# Patient Record
Sex: Male | Born: 1955 | Race: White | Hispanic: No | Smoking: Never smoker
Health system: Southern US, Community
[De-identification: ages and names within clinical notes are randomized; demographics above are authoritative.]

## PROBLEM LIST (undated history)

## (undated) DIAGNOSIS — Z972 Presence of dental prosthetic device (complete) (partial): Secondary | ICD-10-CM

## (undated) DIAGNOSIS — D1803 Hemangioma of intra-abdominal structures: Secondary | ICD-10-CM

## (undated) DIAGNOSIS — M1990 Unspecified osteoarthritis, unspecified site: Secondary | ICD-10-CM

## (undated) DIAGNOSIS — M5136 Other intervertebral disc degeneration, lumbar region: Secondary | ICD-10-CM

## (undated) DIAGNOSIS — I10 Essential (primary) hypertension: Secondary | ICD-10-CM

## (undated) DIAGNOSIS — M51369 Other intervertebral disc degeneration, lumbar region without mention of lumbar back pain or lower extremity pain: Secondary | ICD-10-CM

## (undated) DIAGNOSIS — T8859XA Other complications of anesthesia, initial encounter: Secondary | ICD-10-CM

## (undated) DIAGNOSIS — F329 Major depressive disorder, single episode, unspecified: Secondary | ICD-10-CM

## (undated) DIAGNOSIS — K635 Polyp of colon: Secondary | ICD-10-CM

## (undated) DIAGNOSIS — G4700 Insomnia, unspecified: Secondary | ICD-10-CM

## (undated) DIAGNOSIS — R55 Syncope and collapse: Secondary | ICD-10-CM

## (undated) DIAGNOSIS — R972 Elevated prostate specific antigen [PSA]: Secondary | ICD-10-CM

## (undated) DIAGNOSIS — N138 Other obstructive and reflux uropathy: Secondary | ICD-10-CM

## (undated) DIAGNOSIS — K5792 Diverticulitis of intestine, part unspecified, without perforation or abscess without bleeding: Secondary | ICD-10-CM

## (undated) DIAGNOSIS — I639 Cerebral infarction, unspecified: Secondary | ICD-10-CM

## (undated) DIAGNOSIS — I350 Nonrheumatic aortic (valve) stenosis: Secondary | ICD-10-CM

## (undated) DIAGNOSIS — R7303 Prediabetes: Secondary | ICD-10-CM

## (undated) DIAGNOSIS — I700 Atherosclerosis of aorta: Secondary | ICD-10-CM

## (undated) DIAGNOSIS — Z8719 Personal history of other diseases of the digestive system: Secondary | ICD-10-CM

## (undated) DIAGNOSIS — C61 Malignant neoplasm of prostate: Principal | ICD-10-CM

## (undated) DIAGNOSIS — I493 Ventricular premature depolarization: Secondary | ICD-10-CM

## (undated) DIAGNOSIS — F419 Anxiety disorder, unspecified: Secondary | ICD-10-CM

## (undated) DIAGNOSIS — G4730 Sleep apnea, unspecified: Secondary | ICD-10-CM

## (undated) DIAGNOSIS — T4145XA Adverse effect of unspecified anesthetic, initial encounter: Secondary | ICD-10-CM

## (undated) DIAGNOSIS — K5790 Diverticulosis of intestine, part unspecified, without perforation or abscess without bleeding: Secondary | ICD-10-CM

## (undated) DIAGNOSIS — E7800 Pure hypercholesterolemia, unspecified: Secondary | ICD-10-CM

## (undated) DIAGNOSIS — N401 Enlarged prostate with lower urinary tract symptoms: Secondary | ICD-10-CM

## (undated) DIAGNOSIS — R011 Cardiac murmur, unspecified: Secondary | ICD-10-CM

## (undated) DIAGNOSIS — E039 Hypothyroidism, unspecified: Secondary | ICD-10-CM

## (undated) DIAGNOSIS — Z87442 Personal history of urinary calculi: Secondary | ICD-10-CM

## (undated) DIAGNOSIS — K7689 Other specified diseases of liver: Secondary | ICD-10-CM

## (undated) DIAGNOSIS — K760 Fatty (change of) liver, not elsewhere classified: Secondary | ICD-10-CM

## (undated) DIAGNOSIS — F1291 Cannabis use, unspecified, in remission: Secondary | ICD-10-CM

## (undated) HISTORY — PX: BACK SURGERY: SHX140

## (undated) HISTORY — DX: Diverticulitis of intestine, part unspecified, without perforation or abscess without bleeding: K57.92

## (undated) HISTORY — DX: Polyp of colon: K63.5

## (undated) HISTORY — PX: COLONOSCOPY: SHX174

---

## 2012-07-08 ENCOUNTER — Emergency Department: Payer: Self-pay | Admitting: Internal Medicine

## 2013-01-25 DIAGNOSIS — G459 Transient cerebral ischemic attack, unspecified: Secondary | ICD-10-CM

## 2013-01-25 DIAGNOSIS — I639 Cerebral infarction, unspecified: Secondary | ICD-10-CM

## 2013-01-25 HISTORY — DX: Cerebral infarction, unspecified: I63.9

## 2013-01-25 HISTORY — DX: Transient cerebral ischemic attack, unspecified: G45.9

## 2013-06-27 ENCOUNTER — Inpatient Hospital Stay: Payer: Self-pay | Admitting: Internal Medicine

## 2013-06-27 ENCOUNTER — Ambulatory Visit: Payer: Self-pay | Admitting: Neurology

## 2013-06-27 LAB — COMPREHENSIVE METABOLIC PANEL WITH GFR
Albumin: 3.8 g/dL
Alkaline Phosphatase: 75 U/L
Anion Gap: 4 — ABNORMAL LOW
BUN: 15 mg/dL
Bilirubin,Total: 0.5 mg/dL
Calcium, Total: 8.7 mg/dL
Chloride: 105 mmol/L
Co2: 27 mmol/L
Creatinine: 1.05 mg/dL
EGFR (African American): >60
EGFR (Non-African Amer.): >60
Glucose: 129 mg/dL — ABNORMAL HIGH
Osmolality: 274
Potassium: 4.3 mmol/L
SGOT(AST): 43 U/L — ABNORMAL HIGH
SGPT (ALT): 33 U/L
Sodium: 136 mmol/L
Total Protein: 7.1 g/dL

## 2013-06-27 LAB — URINALYSIS, COMPLETE
BACTERIA: NONE SEEN
BLOOD: NEGATIVE
Bilirubin,UR: NEGATIVE
Glucose,UR: NEGATIVE mg/dL (ref 0–75)
KETONE: NEGATIVE
LEUKOCYTE ESTERASE: NEGATIVE
NITRITE: NEGATIVE
Ph: 6 (ref 4.5–8.0)
Protein: NEGATIVE
RBC,UR: 1 /HPF (ref 0–5)
SPECIFIC GRAVITY: 1.017 (ref 1.003–1.030)
Squamous Epithelial: NONE SEEN

## 2013-06-27 LAB — CBC
HCT: 41.6 %
HGB: 14.3 g/dL
MCH: 30 pg
MCHC: 34.2 g/dL
MCV: 88 fL
Platelet: 268 x10 3/mm 3
RBC: 4.75 x10 6/mm 3
RDW: 12.9 %
WBC: 6.7 x10 3/mm 3

## 2013-06-27 LAB — PLATELET FUNCTION ASSAY: COL/EPI PLT FXN SCRN: 61 s

## 2013-06-27 LAB — TROPONIN I: Troponin-I: <0.02 ng/mL

## 2013-06-28 DIAGNOSIS — I517 Cardiomegaly: Secondary | ICD-10-CM

## 2013-06-28 LAB — LIPID PANEL
CHOLESTEROL: 175 mg/dL (ref 0–200)
HDL: 27 mg/dL — AB (ref 40–60)
Ldl Cholesterol, Calc: 105 mg/dL — ABNORMAL HIGH (ref 0–100)
TRIGLYCERIDES: 214 mg/dL — AB (ref 0–200)
VLDL CHOLESTEROL, CALC: 43 mg/dL — AB (ref 5–40)

## 2014-04-24 DIAGNOSIS — E039 Hypothyroidism, unspecified: Secondary | ICD-10-CM | POA: Insufficient documentation

## 2014-05-18 NOTE — Consult Note (Signed)
PATIENT NAME:  Randall Flores, Randall Flores MR#:  [id number] DATE OF BIRTH:  [birth year]  DATE OF CONSULTATION:  06/27/2013  CONSULTING PHYSICIAN:  Leotis Pain, MD  REASON FOR CONSULTATION: Rule out stroke.   HISTORY OF PRESENT ILLNESS: This is a 59 year old gentleman presenting with headache and right-sided weakness. The patient has a past medical history of hypothyroidism, depression,  hyperlipidemia. The patient states he woke up with diffuse pressure-like headache in the occipital regions bilaterally of his head, went back to sleep. When he woke up early in the morning, he noticed that he was weaker in the right upper and right lower extremities. He decided to drive himself to work. When he came to work, he felt that he was still weak and decided to come to the Emergency Department because of the right-sided weakness. For that reason, he was admitted with concern of a stroke. Work-up included a CAT scan of the head that did not show any acute intracranial abnormality. Ultrasound: No hemodynamic stenosis. MRI of the brain: No acute intracranial abnormality. The patient's exam has significantly improved and he is close to baseline. He is on aspirin 81 mg daily.   REVIEW OF SYSTEMS:  CONSTITUTIONAL: Generalized fatigue. No visual changes. No postnasal drip.  RESPIRATORY: No cough. No wheezing.  CARDIOVASCULAR: No chest pain. No orthopnea. GASTROINTESTINAL: No nausea. No vomiting.  GENITOURINARY: No dysuria or hematuria.  ENDOCRINE: No polyuria. No nocturia. INTEGUMENTARY: No rash. No lesions.  MUSCULOSKELETAL: No arthritis. No swelling. No gout. NEUROLOGICAL: Positive weakness, right side, that has improved.  PSYCHIATRIC: No anxiety. Positive for depression.   PAST MEDICAL HISTORY: Hypothyroidism, depression, history of hyperlipidemia.   SOCIAL HISTORY: No smoking. Drinks about 4 or 5 shots of whiskey daily. No illicit drug use. Lives at home with his wife.   FAMILY HISTORY: Mother is alive, history  of Alzheimer's disease. Strong family history of stroke and heart disease.   HOME MEDICATIONS: Include Synthroid, Celexa, aspirin 81 mg, simvastatin.   LABORATORY WORK-UP: Has been reviewed.   IMAGING: No acute intracranial abnormalities on both MRI and CAT scan of the head. No significant hemodynamic stenosis on the ultrasound of his carotids.   PHYSICAL EXAMINATION: VITAL SIGNS: Include a temperature of 97.9, pulse 66, respirations 18, blood pressure 163/92. NEUROLOGIC: The patient is alert, awake, oriented to time, place, location, and the reason why he is in the hospital. Speech appears to be fluent. No signs of dysarthria or aphasia. Repetition intact. Attention intact. Cranial nerve examination: Extraocular movements are intact. Pupils 4 to 2, reactive bilaterally. Facial sensation intact. Facial motor is intact. Tongue is in midline. Uvula elevates symmetrically. Shoulder shrug is intact. Motor strength appears to be 5 out of 5 bilateral upper and lower extremities. No drift noted noticed. Coordination: Finger-to-nose intact. Reflexes symmetrical. Sensation intact to light touch and temperature. Gait not assessed.   IMPRESSION: A 59 year old gentleman with acute onset of a headache followed by right-sided weakness, status post imaging with no acute intracranial abnormalities. The patient's symptoms have improved, but still complaining of dull pressure-like headache in the back of his head. I believe symptoms could be possibility of transient ischemic attack. At the same time, the headache is worrisome, as the patient does not have history of headaches. Last time the patient had a headache, or a migraine as she describes it, was a long time ago when he was diagnosed with hypothyroidism. At the same time, current symptoms could be a complication of the current headache with unilateral weakness, as since his  symptoms are improving.   PLAN: I would like to obtain a CTA of the head just because of  the headache. I want to make sure there are no small aneurysms that could have leaked, which would be better seen on the CTA. Will start the patient on thiamine and folate because he drinks 4 to 5 drinks a day. Pain control for the headache. Agree with continuation of Plavix 75 daily. Physical therapy and occupational therapy. If the CTA is negative, suspect the patient can be discharged tomorrow early in the morning.   Thank you, it was a pleasure seeing this patient. Please call with any questions.   ____________________________ Leotis Pain, MD yz:jcm D: 06/27/2013 19:37:40 ET T: 06/27/2013 20:02:59 ET JOB#: [id number]  cc: Leotis Pain, MD, <Dictator> Leotis Pain MD ELECTRONICALLY SIGNED 07/12/2013 17:20

## 2014-05-18 NOTE — H&P (Signed)
PATIENT NAME:  Randall Flores, Randall Flores MR#:  [id number] DATE OF BIRTH:  12/04/1955  DATE OF ADMISSION:  06/27/2013  PRIMARY CARE PHYSICIAN: Percell Miller L. Luan Pulling, MD   CHIEF COMPLAINT: Headache and right-sided weakness.   HISTORY OF PRESENT ILLNESS: This is a 58 year old male who presents to the Emergency Room due to headache and worsening right-sided weakness. The patient says that he developed a headache late yesterday evening, went to bed with the headache. He woke up this morning, did feel right, where his right side of his body felt kind of heavy. The patient actually drove to work, and at work, he was noticed to have difficulty using his right side of the body. His boss then told him to come to the ER for further evaluation. In the Emergency Room, the patient has significant right-sided weakness. A CT scan of the head is negative, but the patient likely has symptoms of an acute CVA, and therefore hospitalist services were contacted for further treatment and evaluation.   REVIEW OF SYSTEMS:  CONSTITUTIONAL: No documented fever. No weight gain, no weight loss.  EYES: No blurry or double vision.  ENT: No tinnitus. No postnasal drip. No redness of the oropharynx.  RESPIRATORY: No cough. No wheeze. No hemoptysis. No dyspnea.  CARDIOVASCULAR: No chest pain. No orthopnea. No palpitations. No syncope.  GASTROINTESTINAL: No nausea. No vomiting. No diarrhea. No abdominal pain. No melena or hematochezia.  GENITOURINARY: No dysuria, no hematuria.  ENDOCRINE: No polyuria, nocturia, heat or cold intolerance.  HEMATOLOGIC: No anemia, no bruising, no bleeding.  INTEGUMENTARY: No rashes. No lesions.  MUSCULOSKELETAL: No arthritis, no swelling, no gout.  NEUROLOGIC: Positive numbness and weakness on the right side. No ataxia. No seizure-type activity. Positive headache.  PSYCHIATRIC: No anxiety. No insomnia. No ADD. Positive depression.   PAST MEDICAL HISTORY: Consistent with hypothyroidism, depression, history of  hyperlipidemia.   ALLERGIES: No known drug allergies.   SOCIAL HISTORY: No smoking. Does drink about 4 to 5 shots of whiskey daily. No illicit drug abuse. Lives at home with his wife.   FAMILY HISTORY: Mother is alive, has history of Alzheimer's. Father died from an acute MI. He also has a sister who has had a stroke.   CURRENT MEDICATIONS: As follows:  1. Synthroid 175 mcg daily.  2. Celexa 10 mg daily.  3. Aspirin 81 mg daily. 4. Simvastatin daily, although he just takes it occasionally.   PHYSICAL EXAMINATION: Presently is as follows:  VITAL SIGNS: Noted to be: Temperature is 98.2, pulse 69, respirations 18, blood pressure 144/67, saturation is 99% on room air.  GENERAL: He is a pleasant-appearing male in no apparent distress.  HEAD, EYES, EARS, NOSE AND THROAT: Atraumatic, normocephalic. Extraocular muscles are intact. Pupils are equally reactive to light. Sclerae are anicteric. No conjunctival injection. No pharyngeal erythema.  NECK: Supple. There is no jugular venous distention, no bruits, no lymphadenopathy, no thyromegaly.  HEART: Regular rate and rhythm. No murmurs, no rubs, no clicks.  LUNGS: Clear to auscultation bilaterally. No rales or rhonchi. No wheezes.  ABDOMEN: Soft, flat, nontender, nondistended. Has good bowel sounds. No hepatosplenomegaly appreciated.  EXTREMITIES: No evidence of any cyanosis, clubbing or peripheral edema. Has +2 pedal and radial pulses bilaterally.  NEUROLOGICAL: The patient is alert, awake and oriented x3. He does have a 2 out of 5 strength on his right upper and right lower extremity as compared to the left. No facial droop. Babinski's are downgoing bilaterally. Reflexes are +2 bilaterally.  SKIN: Moist and warm with no  rashes.  LYMPHATIC: There is no cervical or axillary lymphadenopathy.   LABORATORY DATA: Showed a serum glucose of 129, BUN 15, creatinine 1.05, sodium 136, potassium 4.3, chloride 105, bicarbonate 27. LFTs are within normal limits.  Troponin less than 0.02. White cell count 6.7, hemoglobin 14.3, hematocrit 41.6, platelet count 268.   IMAGING: The patient did have a CT scan of the head done without contrast which showed minimal white matter microvascular disease, but no acute findings. The patient also had a chest x-ray done which showed no acute cardiopulmonary disease.   ASSESSMENT AND PLAN: This is a 59 year old male with a history of hypothyroidism, depression, history of hyperlipidemia, who presents to the hospital due to headache and right-sided weakness.   1. Acute cerebrovascular accident. This is likely the cause of the patient's headache and right-sided weakness. The patient is out of the thrombolytic window as his symptoms began late yesterday evening. His initial CT head is negative. I will get an MRI of his brain, get a carotid duplex and a 2-dimensional echocardiogram. The patient apparently is on aspirin at home, and therefore I will switch him to Plavix. I will get a platelet function assay as requested by neurology. Will follow q.4 hour neuro checks. Get a neurology consult. I discussed the case with Dr. Irish Elders, who will see the patient. I will get a physical therapy and occupational therapy consult.  2. Hypothyroidism. Will continue his Synthroid.  3. Hyperlipidemia. The patient intermittently takes simvastatin as he gets myalgias with it. I will place him on low-dose Pravachol for now. Will check a lipid profile.  4. Depression. Continue Celexa.  5. Hypertension. The patient has no history of hypertension. Therefore, this is probably Cushing's reflex from his acute cerebrovascular accident. I will tolerate some element of hypertension, place him on some p.r.n. IV hydralazine for now.   CODE STATUS: The patient is a full code.   TIME SPENT ON ADMISSION: 50 minutes.   ____________________________ Belia Heman. Verdell Carmine, MD vjs:lb D: 06/27/2013 11:23:35 ET T: 06/27/2013 11:58:24 ET JOB#: [id number]  cc: Belia Heman.  Verdell Carmine, MD, <Dictator> Henreitta Leber MD ELECTRONICALLY SIGNED 06/30/2013 21:55

## 2014-05-18 NOTE — Discharge Summary (Signed)
PATIENT NAME:  Randall Flores, Randall Flores MR#:  [id number] DATE OF BIRTH:  [birth year]  DATE OF ADMISSION:  06/27/2013 DATE OF DISCHARGE:  06/28/2013   ADMISSION DIAGNOSIS: Cerebrovascular accident.  DISCHARGE DIAGNOSIS: Transient ischemic attack.   CONSULTATIONS: Neurology.   IMAGING:  1. The patient had a CTA of the head which showed no evidence of any aneurysm.  2. MRI of the brain showed no evidence of intracranial hemorrhage or CVA.  3. Carotid ultrasound showed no evidence of significant hemodynamic stenosis.   LABORATORY DATA: Triglycerides 214, cholesterol 175, HDL 27, LDL is 105.   HOSPITAL COURSE: This is a 59 year old male who presented with right-sided weakness. For further details, please refer to the H and P.   1. Transient ischemic attack. The patient's symptoms were consistent with transient ischemic attack. His MRI was negative. He endorses a lot of stress, which probably contributed to his symptoms. The patient was given transient ischemic attack information. Switched from aspirin to Plavix, started on a statin and will need close followup as an outpatient.  2. Elevated blood pressure. The patient has no diagnosis of hypertension. At this time, we did not start any medications, but I did tell him that he needs to follow up closely for his blood pressure as an outpatient and may need to be started if dietary changes and lifestyle changes do not help. He is trying to lose weight, and he is under a lot of stress, which contributes to his elevated blood pressure.  3. Hyperlipidemia. The patient is now started on a statin medication. Side effects, alternatives, benefits and risks were discussed in great detail with him, including liver dysfunction and abdominal pain.  4. Alcohol abuse. The patient does drink alcohol. He is on folic acid as per neurology recommendations.  5. Hypothyroidism. The patient will continue his Synthroid. 6. Depression. The patient was continued on his Celexa.    DISCHARGE MEDICATIONS:  1. Levothyroxine 175 mcg daily.  2. Multivitamin 1 tablet daily.  3. Vitamin C 500 mg daily.  4. Bee pollen daily.  5. Citalopram 10 mg daily. 6. Pravastatin 20 mg at bedtime.  7. Plavix 75 mg daily.  8. Folic acid 1 tablet daily.   DISCHARGE DIET: Low-sodium diet.   DISCHARGE ACTIVITY: As tolerated.   DISCHARGE FOLLOWUP: The patient was referred to [facility].   CONDITION: The patient is medically stable for discharge.   TIME SPENT: 35 minutes.   ____________________________ Donell Beers. Benjie Karvonen, MD spm:lb D: 06/28/2013 12:40:10 ET T: 06/28/2013 12:54:49 ET JOB#: [id number]  cc: Lurleen Soltero P. Benjie Karvonen, MD, <Dictator> Donell Beers Ellen Goris MD ELECTRONICALLY SIGNED 06/28/2013 13:40

## 2014-07-09 ENCOUNTER — Telehealth: Payer: Self-pay | Admitting: Family Medicine

## 2014-07-09 NOTE — Telephone Encounter (Signed)
Pt was last seen in January and was supposed to return for f/u 12 weeks later (in April) [place] site shows that he is receiving prescriptions from a Dr. Ouida Sills at [facility] Please verify if he has transferred care and send letter if appropriate If he has not transferred care to another primary provider, he needs an appt

## 2014-07-09 NOTE — Telephone Encounter (Signed)
E-Fax refill for patient medication: Rx: Citalopram 20 mg tab Qty: 30 Instructions: take one tab by mouth once daily Pharmacy: [address]

## 2014-09-09 ENCOUNTER — Emergency Department: Payer: No Typology Code available for payment source

## 2014-09-09 ENCOUNTER — Emergency Department
Admission: EM | Admit: 2014-09-09 | Discharge: 2014-09-09 | Disposition: A | Discharge status: Home / Self Care | Payer: No Typology Code available for payment source | Attending: Emergency Medicine | Admitting: Emergency Medicine

## 2014-09-09 ENCOUNTER — Encounter: Payer: Self-pay | Admitting: *Deleted

## 2014-09-09 DIAGNOSIS — Y9389 Activity, other specified: Secondary | ICD-10-CM | POA: Insufficient documentation

## 2014-09-09 DIAGNOSIS — S60222A Contusion of left hand, initial encounter: Secondary | ICD-10-CM | POA: Insufficient documentation

## 2014-09-09 DIAGNOSIS — Y9289 Other specified places as the place of occurrence of the external cause: Secondary | ICD-10-CM | POA: Diagnosis not present

## 2014-09-09 DIAGNOSIS — Y998 Other external cause status: Secondary | ICD-10-CM | POA: Insufficient documentation

## 2014-09-09 DIAGNOSIS — I10 Essential (primary) hypertension: Secondary | ICD-10-CM | POA: Insufficient documentation

## 2014-09-09 DIAGNOSIS — W228XXA Striking against or struck by other objects, initial encounter: Secondary | ICD-10-CM | POA: Insufficient documentation

## 2014-09-09 DIAGNOSIS — S6992XA Unspecified injury of left wrist, hand and finger(s), initial encounter: Secondary | ICD-10-CM | POA: Diagnosis present

## 2014-09-09 HISTORY — DX: Essential (primary) hypertension: I10

## 2014-09-09 HISTORY — DX: Anxiety disorder, unspecified: F41.9

## 2014-09-09 HISTORY — DX: Cerebral infarction, unspecified: I63.9

## 2014-09-09 MED ORDER — KETOROLAC TROMETHAMINE 30 MG/ML IJ SOLN
INTRAMUSCULAR | Status: AC
Start: 2014-09-09 — End: 2014-09-10
  Filled 2014-09-09: qty 1

## 2014-09-09 MED ORDER — ONDANSETRON HCL 4 MG/2ML IJ SOLN
INTRAMUSCULAR | Status: AC
Start: 2014-09-09 — End: 2014-09-10
  Filled 2014-09-09: qty 2

## 2014-09-09 NOTE — ED Notes (Signed)
Pt says he hit wall on Saturday night and now hand is swollen.

## 2014-09-09 NOTE — ED Provider Notes (Signed)
[facility] Emergency Department Provider Note ____________________________________________  Time seen: Approximately 9:39 AM  I have reviewed the triage vital signs and the nursing notes.   HISTORY  Chief Complaint Hand Injury   HPI Randall Flores is a 59 y.o. male who presents to the emergency department for left hand pain. He states that he hit the night table in his sleep Saturday night. He now has pain and swelling in the dorsal aspect of the left hand. Patient is right-handed.   Past Medical History  Diagnosis Date  . Stroke   . Anxiety   . Hypertension   . Thyroid disease   . Depression     There are no active problems to display for this patient.   Past Surgical History  Procedure Laterality Date  . Back surgery      No current outpatient prescriptions on file.  Allergies Cod and Codeine  No family history on file.  Social History Social History  Substance Use Topics  . Smoking status: Never Smoker   . Smokeless tobacco: None  . Alcohol Use: Yes    Review of Systems Constitutional: No recent illness. Eyes: No visual changes. ENT: No sore throat. Cardiovascular: Denies chest pain or palpitations. Respiratory: Denies shortness of breath. Gastrointestinal: No abdominal pain.  Genitourinary: Negative for dysuria. Musculoskeletal: Pain in left hand. Skin: Negative for rash. Neurological: Negative for headaches, focal weakness or numbness. 10-point ROS otherwise negative.  ____________________________________________   PHYSICAL EXAM:  VITAL SIGNS: ED Triage Vitals  Enc Vitals Group     BP 09/09/14 0902 144/88 mmHg     Pulse Rate 09/09/14 0902 69     Resp 09/09/14 0902 20     Temp 09/09/14 0902 98 F (36.7 C)     Temp Source 09/09/14 0902 Oral     SpO2 09/09/14 0902 96 %     Weight 09/09/14 0902 232 lb (105.235 kg)     Height 09/09/14 0902 5\' 10"  (1.778 m)     Head Cir --      Peak Flow --      Pain Score  09/09/14 0906 7     Pain Loc --      Pain Edu? --      Excl. in [place]? --     Constitutional: Alert and oriented. Well appearing and in no acute distress. Eyes: Conjunctivae are normal. EOMI. Head: Atraumatic. Nose: No congestion/rhinnorhea. Neck: No stridor.  Respiratory: Normal respiratory effort.   Musculoskeletal: Nontender range of motion of fingers of left hand do to pain. No obvious deformity. Neurologic:  Normal speech and language. No gross focal neurologic deficits are appreciated. Speech is normal. No gait instability. Skin:  Hematoma over the second third and fourth metacarpals Psychiatric: Mood and affect are normal. Speech and behavior are normal.  ____________________________________________   LABS (all labs ordered are listed, but only abnormal results are displayed)  Labs Reviewed - No data to display ____________________________________________  RADIOLOGY  No acute bony abnormality.  I, Sherrie George, personally viewed and evaluated these images (plain radiographs) as part of my medical decision making.  ____________________________________________   PROCEDURES  Procedure(s) performed: Ace bandage applied by ER tech. Neurovascularly intact post application.   ____________________________________________   INITIAL IMPRESSION / ASSESSMENT AND PLAN / ED COURSE  Pertinent labs & imaging results that were available during my care of the patient were reviewed by me and considered in my medical decision making (see chart for details).  Patient was advised to  wear the Ace bandage for comfort. He was advised to follow-up with orthopedics for symptoms that are not improving over the next week .__________________________________________   FINAL CLINICAL IMPRESSION(S) / ED DIAGNOSES  Final diagnoses:  Traumatic hematoma of hand, left, initial encounter      Victorino Dike, FNP 09/09/14 [place], MD 09/09/14 1544

## 2014-10-13 ENCOUNTER — Other Ambulatory Visit: Payer: Self-pay | Admitting: Family Medicine

## 2014-10-13 NOTE — Telephone Encounter (Signed)
Denied Patient is seeing Dr. Ouida Sills now per Epic notes

## 2015-01-21 ENCOUNTER — Other Ambulatory Visit: Payer: Self-pay | Admitting: Internal Medicine

## 2015-01-21 DIAGNOSIS — R1013 Epigastric pain: Secondary | ICD-10-CM

## 2015-01-22 ENCOUNTER — Ambulatory Visit
Admission: RE | Admit: 2015-01-22 | Discharge: 2015-01-22 | Disposition: A | Discharge status: Home / Self Care | Payer: No Typology Code available for payment source | Source: Ambulatory Visit | Attending: Internal Medicine | Admitting: Internal Medicine

## 2015-01-22 DIAGNOSIS — K760 Fatty (change of) liver, not elsewhere classified: Secondary | ICD-10-CM | POA: Insufficient documentation

## 2015-01-22 DIAGNOSIS — K7689 Other specified diseases of liver: Secondary | ICD-10-CM | POA: Diagnosis not present

## 2015-01-22 DIAGNOSIS — R1013 Epigastric pain: Secondary | ICD-10-CM | POA: Insufficient documentation

## 2015-01-22 HISTORY — DX: Fatty (change of) liver, not elsewhere classified: K76.0

## 2015-01-23 ENCOUNTER — Encounter: Payer: Self-pay | Admitting: *Deleted

## 2015-01-23 ENCOUNTER — Other Ambulatory Visit: Payer: Self-pay | Admitting: Internal Medicine

## 2015-01-23 DIAGNOSIS — K7689 Other specified diseases of liver: Secondary | ICD-10-CM

## 2015-02-03 ENCOUNTER — Ambulatory Visit
Admission: RE | Admit: 2015-02-03 | Discharge: 2015-02-03 | Disposition: A | Discharge status: Home / Self Care | Payer: BLUE CROSS/BLUE SHIELD | Source: Ambulatory Visit | Attending: Internal Medicine | Admitting: Internal Medicine

## 2015-02-03 DIAGNOSIS — D1803 Hemangioma of intra-abdominal structures: Secondary | ICD-10-CM | POA: Insufficient documentation

## 2015-02-03 DIAGNOSIS — K7689 Other specified diseases of liver: Secondary | ICD-10-CM | POA: Diagnosis present

## 2015-02-03 DIAGNOSIS — K769 Liver disease, unspecified: Secondary | ICD-10-CM | POA: Insufficient documentation

## 2015-02-03 HISTORY — DX: Hemangioma of intra-abdominal structures: D18.03

## 2015-02-03 MED ORDER — GADOBENATE DIMEGLUMINE 529 MG/ML IV SOLN
20.0000 mL | Freq: Once | INTRAVENOUS | Status: AC | PRN
Start: 2015-02-03 — End: 2015-02-03
  Administered 2015-02-03: 20 mL via INTRAVENOUS

## 2015-02-05 ENCOUNTER — Encounter: Payer: Self-pay | Admitting: General Surgery

## 2015-02-05 ENCOUNTER — Ambulatory Visit (INDEPENDENT_AMBULATORY_CARE_PROVIDER_SITE_OTHER): Payer: BLUE CROSS/BLUE SHIELD | Admitting: General Surgery

## 2015-02-05 VITALS — BP 138/80 | HR 72 | Resp 14 | Ht 70.0 in | Wt 239.0 lb

## 2015-02-05 DIAGNOSIS — R1031 Right lower quadrant pain: Secondary | ICD-10-CM | POA: Diagnosis not present

## 2015-02-05 DIAGNOSIS — R1011 Right upper quadrant pain: Secondary | ICD-10-CM

## 2015-02-05 MED ORDER — POLYETHYLENE GLYCOL 3350 17 GM/SCOOP PO POWD
ORAL | Status: DC
Start: 2015-02-05 — End: 2017-02-02

## 2015-02-05 NOTE — Patient Instructions (Addendum)
The patient is aware to call back for any questions or concerns.  Schedule HIDA scan.  Patient has been scheduled for a HIDA scan at [facility] for 02-13-15 at [facility] Prep: NPO 6 hours prior and no stomach medications.  This patient has also been scheduled for an upper and lower endoscopy on 03-18-15 at [facility]. Patient has been asked to stop Plavix 7 days prior to procedure.

## 2015-02-05 NOTE — Progress Notes (Signed)
Patient ID: Randall Flores, male   DOB: 1955/03/30, 60 y.o.   MRN: [id number]  Chief Complaint  Patient presents with  . Other    gall bladder    HPI Randall Flores is a 60 y.o. male.  Here for evaluation of his gall bladder. He has been having right sided abdominal pain that comes and goes for about a year. The pain last about 45 minutes, and can occur in the early morning hours.  The patient points to the right anterior axillary line midway between the costal margin in the anterior superior iliac spine while describing the location of his pain. He then sleeps his hand inferiorly over the pubic area to the left lower quadrant. Over the holidays he had pain for 6 days in a row. He did have some vomiting as well during the 6 day episode. He states that if he eats something greasy he has abdominal cramps about 30 minutes later. He has not experienced any loose stools. He does notice the pain at night as well, states it feels like his bowels are going to move but don't.  Constipation has been an issues for about 5 years, no bleeding. He had an ultrasound on 01-22-15 and MRI on 02-03-15.  The patient reports he had a colonoscopy 8-10 years ago, age based, and was told that he had polyps. No follow-up was completed. 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  He is here today with his wife of 20 years, Randall Flores.  I personally reviewed the patient's history.  HPI  Past Medical History  Diagnosis Date  . Anxiety   . Hypertension   . Thyroid disease   . Depression   . 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 Colon polyp   . Diverticulitis     Past Surgical History  Procedure Laterality Date  . Back surgery  1998, 2001    [place]  . Colonoscopy      [place]    Family History  Problem Relation  Age of Onset  . Alzheimer's disease Mother     Social History Social History  Substance Use Topics  . Smoking status: Never Smoker   . Smokeless tobacco: Never Used  . Alcohol Use: 0.0 oz/week    0 Standard drinks or equivalent per week     Comment: occasionally    Allergies  Allergen Reactions  . Cod [facility] Allergy]   . Codeine Nausea Only    Current Outpatient Prescriptions  Medication Sig Dispense Refill  . BEE POLLEN PO Take by mouth.    . clopidogrel (PLAVIX) 75 MG tablet Take by mouth.    . docusate sodium (COLACE) 100 MG capsule Take 100 mg by mouth 2 (two) times daily.    Marland Kitchen levothyroxine (SYNTHROID, LEVOTHROID) 137 MCG tablet Take 137 mcg by mouth daily before breakfast.    . lisinopril (PRINIVIL,ZESTRIL) 2.5 MG tablet Take by mouth.    . Multiple Vitamin (MULTIVITAMIN) capsule Take 1 capsule by mouth daily.    . polyethylene glycol powder (GLYCOLAX/MIRALAX) powder 255 grams one bottle for colonoscopy prep 255 g 0   No current facility-administered medications for this visit.    Review of Systems Review of Systems  Constitutional: Negative.   Respiratory: Negative.   Cardiovascular: Negative.   Gastrointestinal: Positive  for nausea, abdominal pain and constipation. Negative for vomiting and diarrhea.  All other systems reviewed and are negative.   Blood pressure 138/80, pulse 72, resp. rate 14, height 5\' 10"  (1.778 m), weight 239 lb (108.41 kg).  Physical Exam Physical Exam  Constitutional: He is oriented to person, place, and time. He appears well-developed and well-nourished.  HENT:  Mouth/Throat: Oropharynx is clear and moist.  Eyes: Conjunctivae are normal. No scleral icterus.  Neck: Neck supple.  Cardiovascular: Normal rate, regular rhythm and normal heart sounds.   No lower leg edema  Pulmonary/Chest: Effort normal and breath sounds normal.  Abdominal: Soft. Normal appearance and bowel sounds are normal. He exhibits no distension. There is no  hepatosplenomegaly. There is tenderness in the right upper quadrant and right lower quadrant. There is no rebound. No hernia.    Genitourinary: Testes normal.  Lymphadenopathy:    He has no cervical adenopathy.       Right: No inguinal adenopathy present.       Left: No inguinal adenopathy present.  Neurological: He is alert and oriented to person, place, and time.  Skin: Skin is warm.  Psychiatric: His behavior is normal.    Data Reviewed  them all ultrasound dated 01/22/2015 showed a small sludge within the gallbladder. No wall thickening. Negative sonographic Murphy sign. Common bile duct 4 mm. Liver showed a multiseptated cyst on the left measuring up to 45 mm. I'll hepatic steatosis noted. MRI suggested for further evaluation of the hepatic cyst.  MRI of the liver dated 02/03/2015 confirmed a complex cyst involving the left lobe as well as an associated small hemangioma. Gallbladder was reported as normal.  Laboratory studies from 10/22/2014 showed a normal basic metabolic panel. Estimated GFR 69. Her function studies were normal. Total bilirubin 0.4, direct bilirubin 0.1. Alkaline phosphatase 56. Normal transaminases.  CBC incomprehensive metabolic panel dated 123456 showed a normal CBC, white blood cell count, minimally depressed hemoglobin at 13.8 (lower limit of normal 14.1) with an MCV of 86.  May 2015 laboratory studies showed a hemoglobin 14.3 with an MCV of 88.  Assessment    dominant pain, not clearly related to the biliary tract. Inconclusive imaging. 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    Plan    In light of the patient's abdominal pain primarily in the right side of the colon more so than the right upper quadrant, we'll obtain a HIDA scan with CCK to determine if the air is truly a biliary source. If this is negative upper and lower endoscopy has been recommended.    Recommend HIDA scan for further evaluation, if negative then recommend colonoscopy.  Patient has  been scheduled for a HIDA scan at [facility] for 02-13-15 at [facility]. Prep: NPO 6 hours prior and no stomach medications.  This patient has also been scheduled for an upper and lower endoscopy on 03-18-15 at [facility]. Patient has been asked to stop Plavix 7 days prior to procedure.    PCP:  [name] This information has been scribed by Karie Fetch [place].   [name] 02/06/2015, 8:12 AM

## 2015-02-06 DIAGNOSIS — R1031 Right lower quadrant pain: Secondary | ICD-10-CM | POA: Insufficient documentation

## 2015-02-06 DIAGNOSIS — R1011 Right upper quadrant pain: Secondary | ICD-10-CM | POA: Insufficient documentation

## 2015-02-11 ENCOUNTER — Telehealth: Payer: Self-pay | Admitting: *Deleted

## 2015-02-11 NOTE — Telephone Encounter (Signed)
Mrs. Flores called to cancel her husband Randall's HIDA Scan scheduled for January 19,2017 at [facility] and his upper and lower endoscopy scheduled for March 18, 2015. She states that it is due to financial and insurance reasons. She states that her and her husband were very happy with Dr. Bary Castilla and office staff. She states she will keep the paperwork/information given to them at time of their appointment and call our office when the financial/new insurance issues are resolved. I advised her to call our office with any further questions or concerns and that I would notify Dr. Bary Castilla. She verbalized her appreciation.

## 2015-02-11 NOTE — Telephone Encounter (Signed)
HIDA scan cancelled per patient. Trish in Endoscopy notified of cancellation of upper and lower endoscopy scheduled for 03/18/15.

## 2015-02-13 ENCOUNTER — Ambulatory Visit: Payer: BLUE CROSS/BLUE SHIELD

## 2015-03-18 ENCOUNTER — Ambulatory Visit
Admission: RE | Admit: 2015-03-18 | Payer: No Typology Code available for payment source | Source: Ambulatory Visit | Admitting: General Surgery

## 2015-03-18 ENCOUNTER — Encounter: Admission: RE | Payer: Self-pay | Source: Ambulatory Visit

## 2015-03-18 SURGERY — COLONOSCOPY WITH PROPOFOL
Anesthesia: General

## 2015-04-24 ENCOUNTER — Emergency Department
Admission: EM | Admit: 2015-04-24 | Discharge: 2015-04-24 | Disposition: A | Discharge status: Home / Self Care | Payer: BLUE CROSS/BLUE SHIELD | Attending: Emergency Medicine | Admitting: Emergency Medicine

## 2015-04-24 ENCOUNTER — Encounter: Payer: Self-pay | Admitting: Emergency Medicine

## 2015-04-24 ENCOUNTER — Emergency Department: Payer: BLUE CROSS/BLUE SHIELD

## 2015-04-24 ENCOUNTER — Ambulatory Visit
Admission: EM | Admit: 2015-04-24 | Discharge: 2015-04-24 | Disposition: A | Discharge status: Other Acute Inpatient Hospital | Payer: BLUE CROSS/BLUE SHIELD | Attending: Family Medicine | Admitting: Family Medicine

## 2015-04-24 DIAGNOSIS — Z86010 Personal history of colonic polyps: Secondary | ICD-10-CM | POA: Insufficient documentation

## 2015-04-24 DIAGNOSIS — R0789 Other chest pain: Secondary | ICD-10-CM | POA: Diagnosis not present

## 2015-04-24 DIAGNOSIS — I10 Essential (primary) hypertension: Secondary | ICD-10-CM | POA: Insufficient documentation

## 2015-04-24 DIAGNOSIS — R9431 Abnormal electrocardiogram [ECG] [EKG]: Secondary | ICD-10-CM | POA: Diagnosis not present

## 2015-04-24 DIAGNOSIS — A084 Viral intestinal infection, unspecified: Secondary | ICD-10-CM | POA: Diagnosis not present

## 2015-04-24 DIAGNOSIS — R112 Nausea with vomiting, unspecified: Secondary | ICD-10-CM

## 2015-04-24 DIAGNOSIS — F329 Major depressive disorder, single episode, unspecified: Secondary | ICD-10-CM | POA: Diagnosis not present

## 2015-04-24 DIAGNOSIS — Z8673 Personal history of transient ischemic attack (TIA), and cerebral infarction without residual deficits: Secondary | ICD-10-CM

## 2015-04-24 DIAGNOSIS — Z79899 Other long term (current) drug therapy: Secondary | ICD-10-CM | POA: Diagnosis not present

## 2015-04-24 LAB — BASIC METABOLIC PANEL
ANION GAP: 6 (ref 5–15)
BUN: 26 mg/dL — ABNORMAL HIGH (ref 6–20)
CHLORIDE: 108 mmol/L (ref 101–111)
CO2: 21 mmol/L — AB (ref 22–32)
Calcium: 8.2 mg/dL — ABNORMAL LOW (ref 8.9–10.3)
Creatinine, Ser: 0.92 mg/dL (ref 0.61–1.24)
GFR calc non Af Amer: >60 mL/min (ref >60)
GLUCOSE: 127 mg/dL — AB (ref 65–99)
POTASSIUM: 3.5 mmol/L (ref 3.5–5.1)
Sodium: 135 mmol/L (ref 135–145)

## 2015-04-24 LAB — CBC
HEMATOCRIT: 40.6 % (ref 40.0–52.0)
HEMOGLOBIN: 13.9 g/dL (ref 13.0–18.0)
MCH: 30.1 pg (ref 26.0–34.0)
MCHC: 34.3 g/dL (ref 32.0–36.0)
MCV: 87.7 fL (ref 80.0–100.0)
Platelets: 275 10*3/uL (ref 150–440)
RBC: 4.64 MIL/uL (ref 4.40–5.90)
RDW: 12.9 % (ref 11.5–14.5)
WBC: 8.1 10*3/uL (ref 3.8–10.6)

## 2015-04-24 LAB — PROTIME-INR
INR: 1.09
Prothrombin Time: 14.3 seconds (ref 11.4–15.0)

## 2015-04-24 LAB — TROPONIN I

## 2015-04-24 MED ORDER — NITROGLYCERIN 0.4 MG SL SUBL
0.4000 mg | SUBLINGUAL_TABLET | SUBLINGUAL | Status: DC | PRN
  Administered 2015-04-24: 0.4 mg via SUBLINGUAL

## 2015-04-24 MED ORDER — ASPIRIN 81 MG PO CHEW
324.0000 mg | CHEWABLE_TABLET | Freq: Once | ORAL | Status: AC
  Administered 2015-04-24: 324 mg via ORAL

## 2015-04-24 MED ORDER — ONDANSETRON 8 MG PO TBDP
8.0000 mg | ORAL_TABLET | Freq: Once | ORAL | Status: AC
  Administered 2015-04-24: 8 mg via ORAL

## 2015-04-24 MED ORDER — ONDANSETRON HCL 4 MG PO TABS: 4.0000 mg | ORAL_TABLET | Freq: Every day | ORAL | Status: DC | PRN

## 2015-04-24 NOTE — ED Provider Notes (Signed)
CSN: [id number]     Arrival date & time 04/24/15  1851 History   None   Nurses notes were reviewed. Chief Complaint  Patient presents with  . Nausea  . Emesis  . Diarrhea  . Chest Pain    Patient is seen today because of chest pain. States she still woke up about 3:00 this morning complaining of. Nausea and vomiting. Patient's been throwing up most the day off and on but states that he started having pain in his left chest near the sternum. States that the pain is 6 out of 10 as been persistent still going on in the is not throwing up at this time and reports pain going down his left arm. He reports about 2 weeks ago had severe pain and episode with discomfort down his left arm. He did not seek medical care at that time and the pain got better and went away.   Physical very strong family history of coronary artery disease and he states that one other male has lived to see in his family age of 85. Multiple relatives who have had sudden death. 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 He was placed on Plavix he is on blood pressure medicine and he was placed on statin which she subsequently stopped about 3 months ago according to his wife because of side effects. He has an appointment see his PCP in the next week to and his wife is going to talk to him about coming off his statin at that time.   (Consider location/radiation/quality/duration/timing/severity/associated sxs/prior Treatment) HPI  Past Medical History  Diagnosis Date  . Anxiety   . Hypertension   . Thyroid disease   . Depression   . Stroke [facility]) 2015    TIA/right side affected  . Colon polyp   . Diverticulitis    Past Surgical History  Procedure Laterality Date  . Back surgery  1998, 2001      . Colonoscopy      [place]   Family History  Problem Relation Age of Onset  . Alzheimer's disease Mother    Social History  Substance Use  Topics  . Smoking status: Never Smoker   . Smokeless tobacco: Never Used  . Alcohol Use: 0.0 oz/week    0 Standard drinks or equivalent per week     Comment: occasionally    Review of Systems  Respiratory: Positive for chest tightness.   Cardiovascular: Positive for chest pain.  Gastrointestinal: Positive for nausea and vomiting.  All other systems reviewed and are negative.   Allergies  Cod and Codeine  Home Medications   Prior to Admission medications   Medication Sig Start Date End Date Taking? Authorizing Provider  BEE POLLEN PO Take by mouth.    Historical Provider, MD  clopidogrel (PLAVIX) 75 MG tablet Take by mouth. 07/30/14   Historical Provider, MD  docusate sodium (COLACE) 100 MG capsule Take 100 mg by mouth 2 (two) times daily.    Historical Provider, MD  levothyroxine (SYNTHROID, LEVOTHROID) 137 MCG tablet Take 137 mcg by mouth daily before breakfast.    Historical Provider, MD  lisinopril (PRINIVIL,ZESTRIL) 2.5 MG tablet Take by mouth. 07/30/14   Historical Provider, MD  Multiple Vitamin (MULTIVITAMIN) capsule Take 1 capsule by mouth daily.    Historical Provider, MD  polyethylene glycol powder (GLYCOLAX/MIRALAX) powder 255 grams one bottle for colonoscopy prep 02/05/15   [name], MD  Meds Ordered and Administered this Visit   Medications  nitroGLYCERIN (NITROSTAT) SL tablet 0.4 mg (0.4 mg Sublingual Given 04/24/15 2000)  aspirin chewable tablet 324 mg (324 mg Oral Given 04/24/15 2000)  ondansetron (ZOFRAN-ODT) disintegrating tablet 8 mg (8 mg Oral Given 04/24/15 2000)    BP 143/79 mmHg  Pulse 90  Temp(Src) 97.9 F (36.6 C) (Oral)  Resp 20  Ht 5\' 11"  (1.803 m)  Wt 211 lb (95.709 kg)  BMI 29.44 kg/m2  SpO2 95% No data found.   Physical Exam  Constitutional: He is oriented to person, place, and time. He appears well-developed and well-nourished.  HENT:  Head: Normocephalic and atraumatic.  Eyes: Conjunctivae are normal. Pupils are equal, round, and  reactive to light.  Neck: Normal range of motion. Neck supple.  Cardiovascular: Normal rate, regular rhythm and normal heart sounds.   Pulmonary/Chest: Effort normal and breath sounds normal.  Abdominal: Soft. Bowel sounds are normal.  Musculoskeletal: Normal range of motion. He exhibits no edema.  Neurological: He is alert and oriented to person, place, and time.  Skin: Skin is warm and dry.  Psychiatric: His mood appears anxious.  Vitals reviewed.   ED Course  Procedures (including critical care time)  Labs Review Labs Reviewed - No data to display  Imaging Review No results found.   Visual Acuity Review  Right Eye Distance:   Left Eye Distance:   Bilateral Distance:    Right Eye Near:   Left Eye Near:    Bilateral Near:         MDM   1. Chest pain radiating to arm   2. Non-intractable vomiting with nausea, vomiting of unspecified type   3. Hx of completed stroke   4. Abnormal EKG    ED ECG REPORT I, Morgen Ritacco H, the attending physician, personally viewed and interpreted this ECG.   Date: 04/24/2015  EKG Time:19:18:41  Rate: 85  Rhythm: there are no previous tracings available for comparison, normal sinus rhythm  Axis: 29  Intervals:none  ST&T Change: none  Patient does have small Q waves in to the significant Q waves in 3 and aVF consistent with inferior infarct age is unknown.     Patient still having chest pain we'll give him nitroglycerin tablet after IV access is established. He is taking Plavix but will go ahead with 4 baby aspirin and given Zofran 8 mg for nausea. EMS has been contacted. His wife wanted drive him to proceed and I discouraged that. Also discussed case with [facility] charge nurse Truman Hayward and she is aware of his eminent arrival  Frederich Cha, MD 04/24/15 2017

## 2015-04-24 NOTE — ED Notes (Signed)
Pt will be transported to [facility] ED via [facility].

## 2015-04-24 NOTE — ED Notes (Signed)
Patient transported to X-ray 

## 2015-04-24 NOTE — Discharge Instructions (Signed)
Chest Pain Observation °It is often hard to give a specific diagnosis for the cause of chest pain. Among other possibilities your symptoms might be caused by inadequate oxygen delivery to your heart (angina). Angina that is not treated or evaluated can lead to a heart attack (myocardial infarction) or death. °Blood tests, electrocardiograms, and X-rays may have been done to help determine a possible cause of your chest pain. After evaluation and observation, your health care provider has determined that it is unlikely your pain was caused by an unstable condition that requires hospitalization. However, a full evaluation of your pain may need to be completed, with additional diagnostic testing as directed. It is very important to keep your follow-up appointments. Not keeping your follow-up appointments could result in permanent heart damage, disability, or death. If there is any problem keeping your follow-up appointments, you must call your health care provider. °HOME CARE INSTRUCTIONS  °Due to the slight chance that your pain could be angina, it is important to follow your health care provider's treatment plan and also maintain a healthy lifestyle: °· Maintain or work toward achieving a healthy weight. °· Stay physically active and exercise regularly. °· Decrease your salt intake. °· Eat a balanced, healthy diet. Talk to a dietitian to learn about heart-healthy foods. °· Increase your fiber intake by including whole grains, vegetables, fruits, and nuts in your diet. °· Avoid situations that cause stress, anger, or depression. °· Take medicines as advised by your health care provider. Report any side effects to your health care provider. Do not stop medicines or adjust the dosages on your own. °· Quit smoking. Do not use nicotine patches or gum until you check with your health care provider. °· Keep your blood pressure, blood sugar, and cholesterol levels within normal limits. °· Limit alcohol intake to no more than  1 drink per day for women who are not pregnant and 2 drinks per day for men. °· Do not abuse drugs. °SEEK IMMEDIATE MEDICAL CARE IF: °You have severe chest pain or pressure which may include symptoms such as: °· You feel pain or pressure in your arms, neck, jaw, or back. °· You have severe back or abdominal pain, feel sick to your stomach (nauseous), or throw up (vomit). °· You are sweating profusely. °· You are having a fast or irregular heartbeat. °· You feel short of breath while at rest. °· You notice increasing shortness of breath during rest, sleep, or with activity. °· You have chest pain that does not get better after rest or after taking your usual medicine. °· You wake from sleep with chest pain. °· You are unable to sleep because you cannot breathe. °· You develop a frequent cough or you are coughing up blood. °· You feel dizzy, faint, or experience extreme fatigue. °· You develop severe weakness, dizziness, fainting, or chills. °Any of these symptoms may represent a serious problem that is an emergency. Do not wait to see if the symptoms will go away. Call your local emergency services (911 in the [place] Do not drive yourself to the hospital. °MAKE SURE YOU: °· Understand these instructions. °· Will watch your condition. °· Will get help right away if you are not doing well or get worse. °  °This information is not intended to replace advice given to you by your health care provider. Make sure you discuss any questions you have with your health care provider. °  °Document Released: 02/13/2010 Document Revised: 01/16/2013 Document Reviewed: 07/13/2012 °Elsevier Interactive Patient   Education 2016 Else[employer]  Nausea and Vomiting Nausea means you feel sick to your stomach. Throwing up (vomiting) is a reflex where stomach contents come out of your mouth. HOME CARE   Take medicine as told by your doctor.  Do not force yourself to eat. However, you do need to drink fluids.  If you feel like  eating, eat a normal diet as told by your doctor.  Eat rice, wheat, potatoes, bread, lean meats, yogurt, fruits, and vegetables.  Avoid high-fat foods.  Drink enough fluids to keep your pee (urine) clear or pale yellow.  Ask your doctor how to replace body fluid losses (rehydrate). Signs of body fluid loss (dehydration) include:  Feeling very thirsty.  Dry lips and mouth.  Feeling dizzy.  Dark pee.  Peeing less than normal.  Feeling confused.  Fast breathing or heart rate. GET HELP RIGHT AWAY IF:   You have blood in your throw up.  You have black or bloody poop (stool).  You have a bad headache or stiff neck.  You feel confused.  You have bad belly (abdominal) pain.  You have chest pain or trouble breathing.  You do not pee at least once every 8 hours.  You have cold, clammy skin.  You keep throwing up after 24 to 48 hours.  You have a fever. MAKE SURE YOU:   Understand these instructions.  Will watch your condition.  Will get help right away if you are not doing well or get worse.   This information is not intended to replace advice given to you by your health care provider. Make sure you discuss any questions you have with your health care provider.   Document Released: 06/30/2007 Document Revised: 04/05/2011 Document Reviewed: 06/12/2010 Elsevier Interactive Patient Education [employer].

## 2015-04-24 NOTE — ED Notes (Signed)
Per EMS: from [place], Nvd x 1 day today at 1300, cp x 2 weeks, 324 asa, 8 zofran, 1 ntg given prior to ems arrival.  128//70, 85,  lungs clear, ecg has changes but is nsr with no ectopy.

## 2015-04-24 NOTE — ED Notes (Addendum)
Pt presents with n/v/d started early this am and has been unable to keep anything down with body aches and chills. Also reports left sided chest pain with radiation down into left arm started this afternoon, states his left arm feels weak. Pt with hx of stroke.

## 2015-04-24 NOTE — Discharge Instructions (Signed)

## 2015-04-24 NOTE — ED Provider Notes (Signed)
[facility] Emergency Department Provider Note  ____________________________________________    I have reviewed the triage vital signs and the nursing notes.   HISTORY  Chief Complaint Nausea; Emesis; Diarrhea; Chest Pain; and Anorexia    HPI Randall Flores is a 60 y.o. male who presents with nausea vomiting and diarrhea. Patient was sent from urgent carefor evaluation. Patient reports he started having nausea vomiting and diarrhea at approximately 2 AM this morning. He has been vomiting most of the day. He attributes his chest discomfort to this. He has been having watery diarrhea as well. He denies fevers or chills. No abdominal pain. No recent travel. He is not having chest pain in the emergency department     Past Medical History  Diagnosis Date  . Anxiety   . Hypertension   . Thyroid disease   . Depression   . 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 Colon polyp   . Diverticulitis     Patient Active Problem List   Diagnosis Date Noted  . Abdominal pain, right upper quadrant 02/06/2015  . Right lower quadrant abdominal pain 02/06/2015    Past Surgical History  Procedure Laterality Date  . Back surgery  1998, 2001    [place]  . Colonoscopy      [place]    Current Outpatient Rx  Name  Route  Sig  Dispense  Refill  . BEE POLLEN PO   Oral   Take by mouth.         . clopidogrel (PLAVIX) 75 MG tablet   Oral   Take by mouth.         . docusate sodium (COLACE) 100 MG capsule   Oral   Take 100 mg by mouth 2 (two) times daily.         Marland Kitchen levothyroxine (SYNTHROID, LEVOTHROID) 137 MCG tablet   Oral   Take 137 mcg by mouth daily before breakfast.         . lisinopril (PRINIVIL,ZESTRIL) 2.5 MG tablet   Oral   Take by mouth.         . Multiple Vitamin (MULTIVITAMIN) capsule   Oral   Take 1 capsule by mouth daily.         . polyethylene glycol powder (GLYCOLAX/MIRALAX) powder      255 grams one bottle for  colonoscopy prep   255 g   0     Allergies Cod and Codeine  Family History  Problem Relation Age of Onset  . Alzheimer's disease Mother     Social History Social History  Substance Use Topics  . Smoking status: Never Smoker   . Smokeless tobacco: Never Used  . Alcohol Use: 0.0 oz/week    0 Standard drinks or equivalent per week     Comment: occasionally    Review of Systems  Constitutional: Negative for fever. Eyes: Negative for redness ENT: Negative for sore throat Cardiovascular: As above Respiratory: Negative for shortness of breath. Gastrointestinal: As above Genitourinary: Negative for dysuria. Musculoskeletal: Negative for myalgias Skin: Negative for rash. Neurological: Negative for focal weakness Psychiatric: no anxiety    ____________________________________________   PHYSICAL EXAM:  VITAL SIGNS: ED Triage Vitals  Enc Vitals Group     BP 04/24/15 2049 124/80 mmHg     Pulse Rate 04/24/15 2045 82     Resp 04/24/15 2045 12     Temp 04/24/15 2049 97.7 F (36.5 C)     Temp Source 04/24/15 2049 Oral  SpO2 04/24/15 2045 96 %     Weight 04/24/15 2049 233 lb (105.688 kg)     Height 04/24/15 2049 5\' 11"  (1.803 m)     Head Cir --      Peak Flow --      Pain Score 04/24/15 2050 4     Pain Loc --      Pain Edu? --      Excl. in [place]? --      Constitutional: Alert and oriented. Well appearing and in no distress.  Eyes: Conjunctivae are normal. No erythema or injection ENT   Head: Normocephalic and atraumatic.   Mouth/Throat: Mucous membranes are moist. Cardiovascular: Normal rate, regular rhythm. Normal and symmetric distal pulses are present in the upper extremities Respiratory: Normal respiratory effort without tachypnea nor retractions. Breath sounds are clear and equal bilaterally.  Gastrointestinal: Soft and non-tender in all quadrants. No distention. There is no CVA tenderness. Genitourinary: deferred Musculoskeletal: Nontender with  normal range of motion in all extremities. No lower extremity tenderness nor edema. Neurologic:  Normal speech and language. No gross focal neurologic deficits are appreciated. Skin:  Skin is warm, dry and intact. No rash noted. Psychiatric: Mood and affect are normal. Patient exhibits appropriate insight and judgment.  ____________________________________________    LABS (pertinent positives/negatives)  Labs Reviewed  BASIC METABOLIC PANEL - Abnormal; Notable for the following:    CO2 21 (*)    Glucose, Bld 127 (*)    BUN 26 (*)    Calcium 8.2 (*)    All other components within normal limits  CBC  TROPONIN I  PROTIME-INR    ____________________________________________   EKG  ED ECG REPORT I, [name], the attending physician, personally viewed and interpreted this ECG.  Date: 04/24/2015 EKG Time: 8:42 PM Rate: 83 Rhythm: normal sinus rhythm QRS Axis: normal Intervals: normal ST/T Wave abnormalities: normal Conduction Disturbances: none Narrative Interpretation: unremarkable   ____________________________________________    RADIOLOGY  Chest x-ray unremarkable  ____________________________________________   PROCEDURES  Procedure(s) performed: none  Critical Care performed: none  ____________________________________________   INITIAL IMPRESSION / ASSESSMENT AND PLAN / ED COURSE  Pertinent labs & imaging results that were available during my care of the patient were reviewed by me and considered in my medical decision making (see chart for details).  Patient overall well-appearing. History of present illness most consistent with viral gastroenteritis. He is not complaining of chest pain. We'll check EKG and labs to be thorough. IV Zofran given and IV fluids   ----------------------------------------- 10:22 PM on 04/24/2015 -----------------------------------------  Lab work is unremarkable. Troponin is normal. Chest x-ray is benign. Patient  is feeling significantly better after treatment. Recommend supportive care, prescription for Zofran. PCP follow-up as needed  ____________________________________________   FINAL CLINICAL IMPRESSION(S) / ED DIAGNOSES  Final diagnoses:  Viral gastroenteritis          [name], MD 04/24/15 2240

## 2016-06-08 ENCOUNTER — Other Ambulatory Visit: Payer: Self-pay | Admitting: Internal Medicine

## 2016-06-08 ENCOUNTER — Ambulatory Visit
Admission: RE | Admit: 2016-06-08 | Discharge: 2016-06-08 | Disposition: A | Discharge status: Home / Self Care | Payer: BLUE CROSS/BLUE SHIELD | Source: Ambulatory Visit | Attending: Internal Medicine | Admitting: Internal Medicine

## 2016-06-08 DIAGNOSIS — K529 Noninfective gastroenteritis and colitis, unspecified: Secondary | ICD-10-CM | POA: Insufficient documentation

## 2016-06-08 DIAGNOSIS — K7689 Other specified diseases of liver: Secondary | ICD-10-CM

## 2016-06-08 DIAGNOSIS — R109 Unspecified abdominal pain: Secondary | ICD-10-CM

## 2016-06-08 DIAGNOSIS — K5730 Diverticulosis of large intestine without perforation or abscess without bleeding: Secondary | ICD-10-CM | POA: Insufficient documentation

## 2016-06-08 DIAGNOSIS — K639 Disease of intestine, unspecified: Secondary | ICD-10-CM | POA: Diagnosis not present

## 2016-06-08 DIAGNOSIS — R319 Hematuria, unspecified: Secondary | ICD-10-CM | POA: Diagnosis present

## 2016-06-08 DIAGNOSIS — R1084 Generalized abdominal pain: Secondary | ICD-10-CM | POA: Diagnosis present

## 2016-06-08 HISTORY — DX: Other specified diseases of liver: K76.89

## 2016-06-16 ENCOUNTER — Other Ambulatory Visit
Admission: RE | Admit: 2016-06-16 | Discharge: 2016-06-16 | Disposition: A | Discharge status: Home / Self Care | Payer: BLUE CROSS/BLUE SHIELD | Source: Ambulatory Visit | Attending: Gastroenterology | Admitting: Gastroenterology

## 2016-06-16 DIAGNOSIS — R1084 Generalized abdominal pain: Secondary | ICD-10-CM | POA: Insufficient documentation

## 2016-06-16 LAB — C DIFFICILE QUICK SCREEN W PCR REFLEX
C DIFFICILE (CDIFF) INTERP: NOT DETECTED
C DIFFICILE (CDIFF) TOXIN: NEGATIVE
C Diff antigen: NEGATIVE

## 2016-06-16 LAB — GASTROINTESTINAL PANEL BY PCR, STOOL (REPLACES STOOL CULTURE)

## 2016-06-28 ENCOUNTER — Other Ambulatory Visit: Payer: Self-pay | Admitting: Gastroenterology

## 2016-06-28 DIAGNOSIS — R1084 Generalized abdominal pain: Secondary | ICD-10-CM

## 2016-06-28 DIAGNOSIS — R935 Abnormal findings on diagnostic imaging of other abdominal regions, including retroperitoneum: Secondary | ICD-10-CM

## 2016-07-02 ENCOUNTER — Ambulatory Visit
Admission: RE | Admit: 2016-07-02 | Discharge: 2016-07-02 | Disposition: A | Discharge status: Home / Self Care | Payer: BLUE CROSS/BLUE SHIELD | Source: Ambulatory Visit | Attending: Gastroenterology | Admitting: Gastroenterology

## 2016-07-02 DIAGNOSIS — R935 Abnormal findings on diagnostic imaging of other abdominal regions, including retroperitoneum: Secondary | ICD-10-CM | POA: Insufficient documentation

## 2016-07-02 DIAGNOSIS — R1084 Generalized abdominal pain: Secondary | ICD-10-CM | POA: Diagnosis present

## 2016-07-02 MED ORDER — IOPAMIDOL (ISOVUE-300) INJECTION 61%
100.0000 mL | Freq: Once | INTRAVENOUS | Status: AC | PRN
  Administered 2016-07-02: 100 mL via INTRAVENOUS

## 2016-12-22 ENCOUNTER — Other Ambulatory Visit: Payer: Self-pay

## 2016-12-22 ENCOUNTER — Telehealth: Payer: Self-pay | Admitting: Gastroenterology

## 2016-12-22 DIAGNOSIS — Z1211 Encounter for screening for malignant neoplasm of colon: Secondary | ICD-10-CM

## 2016-12-22 NOTE — Telephone Encounter (Signed)
Gastroenterology Pre-Procedure Review  Request Date:  Requesting Physician: Dr.   PATIENT REVIEW QUESTIONS: The patient responded to the following health history questions as indicated:    1. Are you having any GI issues? no 2. Do you have a personal history of Polyps? no 3. Do you have a family history of Colon Cancer or Polyps? no 4. Diabetes Mellitus? no 5. Joint replacements in the past 12 months?no 6. Major health problems in the past 3 months?no 7. Any artificial heart valves, MVP, or defibrillator?no    MEDICATIONS & ALLERGIES:    Patient reports the following regarding taking any anticoagulation/antiplatelet therapy:   Plavix, Coumadin, Eliquis, Xarelto, Lovenox, Pradaxa, Brilinta, or Effient? yes (Plavix 75mg ) Aspirin? no  Patient confirms/reports the following medications:  Current Outpatient Medications  Medication Sig Dispense Refill  . BEE POLLEN PO Take by mouth.    . clopidogrel (PLAVIX) 75 MG tablet Take by mouth.    . docusate sodium (COLACE) 100 MG capsule Take 100 mg by mouth 2 (two) times daily.    Marland Kitchen levothyroxine (SYNTHROID, LEVOTHROID) 137 MCG tablet Take 137 mcg by mouth daily before breakfast.    . lisinopril (PRINIVIL,ZESTRIL) 2.5 MG tablet Take by mouth.    . Multiple Vitamin (MULTIVITAMIN) capsule Take 1 capsule by mouth daily.    . ondansetron (ZOFRAN) 4 MG tablet Take 1 tablet (4 mg total) by mouth daily as needed for nausea or vomiting. 20 tablet 1  . polyethylene glycol powder (GLYCOLAX/MIRALAX) powder 255 grams one bottle for colonoscopy prep 255 g 0   No current facility-administered medications for this visit.     Patient confirms/reports the following allergies:  Allergies  Allergen Reactions  . Cod [facility] Allergy]   . Codeine Nausea Only    No orders of the defined types were placed in this encounter.   AUTHORIZATION INFORMATION Primary Insurance: 1D#: Group #:  Secondary Insurance: 1D#: Group #:  SCHEDULE INFORMATION: Date:  01/21/17 Time: Location: [place]

## 2016-12-22 NOTE — Telephone Encounter (Signed)
Patients wife, Jackelyn Poling, called and stated that he needs to get his colonoscopy before the end of the year with Dr. Allen Norris otherwise they will have to go somewhere else. Please call to schedule.

## 2016-12-23 ENCOUNTER — Other Ambulatory Visit: Payer: Self-pay

## 2017-01-12 ENCOUNTER — Other Ambulatory Visit: Payer: Self-pay

## 2017-01-12 ENCOUNTER — Encounter: Payer: Self-pay | Admitting: *Deleted

## 2017-01-20 NOTE — Discharge Instructions (Signed)
General Anesthesia, Adult, Care After °These instructions provide you with information about caring for yourself after your procedure. Your health care provider may also give you more specific instructions. Your treatment has been planned according to current medical practices, but problems sometimes occur. Call your health care provider if you have any problems or questions after your procedure. °What can I expect after the procedure? °After the procedure, it is common to have: °· Vomiting. °· A sore throat. °· Mental slowness. ° °It is common to feel: °· Nauseous. °· Cold or shivery. °· Sleepy. °· Tired. °· Sore or achy, even in parts of your body where you did not have surgery. ° °Follow these instructions at home: °For at least 24 hours after the procedure: °· Do not: °? Participate in activities where you could fall or become injured. °? Drive. °? Use heavy machinery. °? Drink alcohol. °? Take sleeping pills or medicines that cause drowsiness. °? Make important decisions or sign legal documents. °? Take care of children on your own. °· Rest. °Eating and drinking °· If you vomit, drink water, juice, or soup when you can drink without vomiting. °· Drink enough fluid to keep your urine clear or pale yellow. °· Make sure you have little or no nausea before eating solid foods. °· Follow the diet recommended by your health care provider. °General instructions °· Have a responsible adult stay with you until you are awake and alert. °· Return to your normal activities as told by your health care provider. Ask your health care provider what activities are safe for you. °· Take over-the-counter and prescription medicines only as told by your health care provider. °· If you smoke, do not smoke without supervision. °· Keep all follow-up visits as told by your health care provider. This is important. °Contact a health care provider if: °· You continue to have nausea or vomiting at home, and medicines are not helpful. °· You  cannot drink fluids or start eating again. °· You cannot urinate after 8-12 hours. °· You develop a skin rash. °· You have fever. °· You have increasing redness at the site of your procedure. °Get help right away if: °· You have difficulty breathing. °· You have chest pain. °· You have unexpected bleeding. °· You feel that you are having a life-threatening or urgent problem. °This information is not intended to replace advice given to you by your health care provider. Make sure you discuss any questions you have with your health care provider. °Document Released: 04/19/2000 Document Revised: 06/16/2015 Document Reviewed: 12/26/2014 °Elsevier Interactive Patient Education © 2018 Elsevier Inc. ° °

## 2017-01-21 ENCOUNTER — Ambulatory Visit: Payer: BLUE CROSS/BLUE SHIELD | Admitting: Anesthesiology

## 2017-01-21 ENCOUNTER — Ambulatory Visit
Admission: RE | Admit: 2017-01-21 | Discharge: 2017-01-21 | Disposition: A | Discharge status: Home / Self Care | Payer: BLUE CROSS/BLUE SHIELD | Source: Ambulatory Visit | Attending: Gastroenterology | Admitting: Gastroenterology

## 2017-01-21 ENCOUNTER — Encounter
Admission: RE | Disposition: A | Discharge status: Home / Self Care | Payer: Self-pay | Source: Ambulatory Visit | Attending: Gastroenterology

## 2017-01-21 DIAGNOSIS — Z8673 Personal history of transient ischemic attack (TIA), and cerebral infarction without residual deficits: Secondary | ICD-10-CM | POA: Insufficient documentation

## 2017-01-21 DIAGNOSIS — K5140 Inflammatory polyps of colon without complications: Secondary | ICD-10-CM | POA: Diagnosis not present

## 2017-01-21 DIAGNOSIS — K5730 Diverticulosis of large intestine without perforation or abscess without bleeding: Secondary | ICD-10-CM | POA: Diagnosis not present

## 2017-01-21 DIAGNOSIS — I10 Essential (primary) hypertension: Secondary | ICD-10-CM | POA: Insufficient documentation

## 2017-01-21 DIAGNOSIS — F329 Major depressive disorder, single episode, unspecified: Secondary | ICD-10-CM | POA: Diagnosis not present

## 2017-01-21 DIAGNOSIS — K640 First degree hemorrhoids: Secondary | ICD-10-CM | POA: Diagnosis not present

## 2017-01-21 DIAGNOSIS — Z1211 Encounter for screening for malignant neoplasm of colon: Secondary | ICD-10-CM

## 2017-01-21 DIAGNOSIS — E079 Disorder of thyroid, unspecified: Secondary | ICD-10-CM | POA: Diagnosis not present

## 2017-01-21 DIAGNOSIS — Z7902 Long term (current) use of antithrombotics/antiplatelets: Secondary | ICD-10-CM | POA: Diagnosis not present

## 2017-01-21 DIAGNOSIS — Z79899 Other long term (current) drug therapy: Secondary | ICD-10-CM | POA: Diagnosis not present

## 2017-01-21 DIAGNOSIS — F419 Anxiety disorder, unspecified: Secondary | ICD-10-CM | POA: Insufficient documentation

## 2017-01-21 DIAGNOSIS — Z86010 Personal history of colonic polyps: Secondary | ICD-10-CM | POA: Diagnosis not present

## 2017-01-21 HISTORY — DX: Adverse effect of unspecified anesthetic, initial encounter: T41.45XA

## 2017-01-21 HISTORY — DX: Presence of dental prosthetic device (complete) (partial): Z97.2

## 2017-01-21 HISTORY — PX: COLONOSCOPY WITH PROPOFOL: SHX5780

## 2017-01-21 HISTORY — DX: Other complications of anesthesia, initial encounter: T88.59XA

## 2017-01-21 HISTORY — DX: Cardiac murmur, unspecified: R01.1

## 2017-01-21 HISTORY — PX: POLYPECTOMY: SHX5525

## 2017-01-21 SURGERY — COLONOSCOPY WITH PROPOFOL
Anesthesia: General | Wound class: Clean Contaminated

## 2017-01-21 MED ORDER — PROPOFOL 10 MG/ML IV BOLUS
INTRAVENOUS | Status: DC | PRN
  Administered 2017-01-21: 50 mg via INTRAVENOUS
  Administered 2017-01-21: 20 mg via INTRAVENOUS
  Administered 2017-01-21: 30 mg via INTRAVENOUS
  Administered 2017-01-21: 40 mg via INTRAVENOUS
  Administered 2017-01-21: 20 mg via INTRAVENOUS
  Administered 2017-01-21: 30 mg via INTRAVENOUS
  Administered 2017-01-21: 150 mg via INTRAVENOUS

## 2017-01-21 MED ORDER — STERILE WATER FOR IRRIGATION IR SOLN
Status: DC | PRN
  Administered 2017-01-21: 08:00:00

## 2017-01-21 MED ORDER — SODIUM CHLORIDE 0.9 % IV SOLN: INTRAVENOUS | Status: DC

## 2017-01-21 MED ORDER — LACTATED RINGERS IV SOLN
INTRAVENOUS | Status: DC
  Administered 2017-01-21: 07:00:00 via INTRAVENOUS

## 2017-01-21 MED ORDER — ACETAMINOPHEN 325 MG PO TABS: 325.0000 mg | ORAL_TABLET | Freq: Once | ORAL | Status: DC

## 2017-01-21 MED ORDER — ACETAMINOPHEN 160 MG/5ML PO SOLN: 325.0000 mg | Freq: Once | ORAL | Status: DC

## 2017-01-21 SURGICAL SUPPLY — 23 items
CANISTER SUCT 1200ML W/VALVE (MISCELLANEOUS) ×3 IMPLANT
CLIP HMST 235XBRD CATH ROT (MISCELLANEOUS) IMPLANT
CLIP RESOLUTION 360 11X235 (MISCELLANEOUS)
FCP ESCP3.2XJMB 240X2.8X (MISCELLANEOUS) ×2
FORCEPS BIOP RAD 4 LRG CAP 4 (CUTTING FORCEPS) ×2 IMPLANT
FORCEPS BIOP RJ4 240 W/NDL (MISCELLANEOUS) ×3
FORCEPS ESCP3.2XJMB 240X2.8X (MISCELLANEOUS) ×2 IMPLANT
GOWN CVR UNV OPN BCK APRN NK (MISCELLANEOUS) ×4 IMPLANT
GOWN ISOL THUMB LOOP REG UNIV (MISCELLANEOUS) ×6
INJECTOR VARIJECT VIN23 (MISCELLANEOUS) IMPLANT
KIT DEFENDO VALVE AND CONN (KITS) IMPLANT
KIT ENDO PROCEDURE OLY (KITS) ×3 IMPLANT
MARKER SPOT ENDO TATTOO 5ML (MISCELLANEOUS) IMPLANT
PAD GROUND ADULT SPLIT (MISCELLANEOUS) IMPLANT
PROBE APC STR FIRE (PROBE) IMPLANT
RETRIEVER NET ROTH 2.5X230 LF (MISCELLANEOUS) IMPLANT
SNARE SHORT THROW 13M SML OVAL (MISCELLANEOUS) IMPLANT
SNARE SHORT THROW 30M LRG OVAL (MISCELLANEOUS) IMPLANT
SNARE SNG USE RND 15MM (INSTRUMENTS) IMPLANT
SPOT EX ENDOSCOPIC TATTOO (MISCELLANEOUS)
TRAP ETRAP POLY (MISCELLANEOUS) IMPLANT
VARIJECT INJECTOR VIN23 (MISCELLANEOUS)
WATER STERILE IRR 250ML POUR (IV SOLUTION) ×3 IMPLANT

## 2017-01-21 NOTE — Op Note (Signed)
[facility] Gastroenterology Patient Name: Randall Flores Procedure Date: 01/21/2017 7:52 AM MRN: [id number] Account #: [id number] Date of Birth: 1955-05-23 Admit Type: Outpatient Age: 61 Room: [facility] OR ROOM 01 Gender: Male Note Status: Finalized Procedure:            Colonoscopy Indications:          Screening for colorectal malignant neoplasm Providers:            [name] MD, MD Referring MD:         Ocie Cornfield. Ouida Sills MD, MD (Referring MD) Medicines:            Propofol per Anesthesia Complications:        No immediate complications. Procedure:            Pre-Anesthesia Assessment:                       - Prior to the procedure, a History and Physical was                        performed, and patient medications and allergies were                        reviewed. The patient's tolerance of previous                        anesthesia was also reviewed. The risks and benefits of                        the procedure and the sedation options and risks were                        discussed with the patient. All questions were                        answered, and informed consent was obtained. Prior                        Anticoagulants: The patient has taken no previous                        anticoagulant or antiplatelet agents. ASA Grade                        Assessment: II - A patient with mild systemic disease.                        After reviewing the risks and benefits, the patient was                        deemed in satisfactory condition to undergo the                        procedure.                       After obtaining informed consent, the colonoscope was                        passed under direct vision. Throughout the procedure,  the patient's blood pressure, pulse, and oxygen                        saturations were monitored continuously. The [place] [phone]) was introduced  through the                        anus and advanced to the the terminal ileum. The                        colonoscopy was performed without difficulty. The                        patient tolerated the procedure well. The quality of                        the bowel preparation was excellent. Findings:      The perianal and digital rectal examinations were normal.      Patchy pseudopolyps were found in the sigmoid colon, in the descending       colon, at the splenic flexure, in the transverse colon, at the hepatic       flexure and in the ascending colon. Biopsies were taken with a cold       forceps for histology.      Multiple small-mouthed diverticula were found in the sigmoid colon and       ascending colon.      Non-bleeding internal hemorrhoids were found during retroflexion. The       hemorrhoids were Grade I (internal hemorrhoids that do not prolapse). Impression:           - Pseudopolyps in the sigmoid colon, in the descending                        colon, at the splenic flexure, in the transverse colon,                        at the hepatic flexure and in the ascending colon.                        Resected and retrieved. Biopsied.                       - Diverticulosis in the sigmoid colon and in the                        ascending colon.                       - Non-bleeding internal hemorrhoids. Recommendation:       - Discharge patient to home.                       - Resume previous diet.                       - Continue present medications.                       - Await pathology results. Procedure Code(s):    ---  Professional ---                       [phone], Colonoscopy, flexible; with biopsy, single or                        multiple Diagnosis Code(s):    --- Professional ---                       Z12.11, Encounter for screening for malignant neoplasm                        of colon                       K51.40, Inflammatory polyps of colon without                         complications CPT copyright 2016 American Medical Association. All rights reserved. The codes documented in this report are preliminary and upon coder review may  be revised to meet current compliance requirements. [name] MD, MD 01/21/2017 8:27:03 AM This report has been signed electronically. Number of Addenda: 0 Note Initiated On: 01/21/2017 7:52 AM Scope Withdrawal Time: 0 hours 11 minutes 24 seconds  Total Procedure Duration: 0 hours 16 minutes 50 seconds       [facility]

## 2017-01-21 NOTE — Anesthesia Postprocedure Evaluation (Signed)
Anesthesia Post Note  Patient: Randall Flores  Procedure(s) Performed: COLONOSCOPY WITH PROPOFOL (N/A ) POLYPECTOMY  Patient location during evaluation: PACU Anesthesia Type: General Level of consciousness: awake and alert and oriented Pain management: satisfactory to patient Vital Signs Assessment: post-procedure vital signs reviewed and stable Respiratory status: spontaneous breathing, nonlabored ventilation and respiratory function stable Cardiovascular status: blood pressure returned to baseline and stable Postop Assessment: Adequate PO intake and No signs of nausea or vomiting Anesthetic complications: no    Raliegh Ip

## 2017-01-21 NOTE — Anesthesia Procedure Notes (Signed)
Date/Time: 01/21/2017 7:58 AM Performed by: Cameron Ali, CRNA Pre-anesthesia Checklist: Patient identified, Emergency Drugs available, Suction available, Timeout performed and Patient being monitored Patient Re-evaluated:Patient Re-evaluated prior to induction Oxygen Delivery Method: Nasal cannula Placement Confirmation: positive ETCO2

## 2017-01-21 NOTE — H&P (Signed)
Randall Lame, MD [address] [place], [place] [address] Phone: [phone] Fax : [phone]  Primary Care Physician:  [name], MD Primary Gastroenterologist:  Dr. Allen Norris  Pre-Procedure History & Physical: HPI:  Randall Flores AGE is a 61 y.o. male is here for a screening colonoscopy.   Past Medical History:  Diagnosis Date  . Anxiety   . Arthritis    knees, back, hands  . Colon polyp   . Complication of anesthesia    wakes up during anesthesia  . Depression   . Diverticulitis   . Heart murmur   . Hypertension   . Stroke [facility]) 2015   TIA/right side affected  . Thyroid disease   . Wears dentures    partial upper, front    Past Surgical History:  Procedure Laterality Date  . [place], 2001   [place]  . COLONOSCOPY     [place]    Prior to Admission medications   Medication Sig Start Date End Date Taking? Authorizing Provider  BEE POLLEN PO Take 500 mg by mouth daily.    Yes [provider]  citalopram (CELEXA) 20 MG tablet Take 20 mg by mouth daily.   Yes [provider]  clopidogrel (PLAVIX) 75 MG tablet Take by mouth. 07/30/14  Yes [provider]  dicyclomine (BENTYL) 10 MG capsule Take 10 mg by mouth 4 (four) times daily -  before meals and at bedtime. Only taking twice daily   Yes [provider]  docusate sodium (COLACE) 100 MG capsule Take 100 mg by mouth 2 (two) times daily as needed.    Yes [provider]  DULoxetine (CYMBALTA) 60 MG capsule Take 60 mg by mouth daily.   Yes [provider]  GLUCOSAMINE-CHONDROITIN PO Take by mouth daily.   Yes [provider]  levothyroxine (SYNTHROID, LEVOTHROID) 137 MCG tablet Take 137 mcg by mouth daily before breakfast.   Yes [provider]  lisinopril (PRINIVIL,ZESTRIL) 2.5 MG tablet Take 10 mg by mouth daily.  07/30/14  Yes [provider]  Misc Natural Products (PROSTATE SUPPORT PO) Take by mouth daily.    Yes [provider]  Multiple Vitamin (MULTIVITAMIN) capsule Take 1 capsule by mouth daily.   Yes [provider]  ondansetron (ZOFRAN) 4 MG tablet Take 1 tablet (4 mg total) by mouth daily as needed for nausea or vomiting. Patient not taking: Reported on 01/12/2017 04/24/15   [name], MD  polyethylene glycol powder [facility]) powder 255 grams one bottle for colonoscopy prep 02/05/15   [name], MD    Allergies as of 12/22/2016 - Review Complete 04/24/2015  Allergen Reaction Noted  . [place] [fish allergy]  09/09/2014  . Codeine Nausea Only 09/09/2014    Family History  Problem Relation Age of Onset  . Alzheimer's disease Mother     Social History   Socioeconomic History  . Marital status: Married    Spouse name: Not on file  . Number of children: Not on file  . Years of education: Not on file  . Highest education level: Not on file  Social Needs  . Financial resource strain: Not on file  . Food insecurity - worry: Not on file  . Food insecurity - inability: Not on file  . Transportation needs - medical: Not on file  . Transportation needs - non-medical: Not on file  Occupational History  . Not on file  Tobacco Use  . Smoking status: Never Smoker  .  Smokeless tobacco: Never Used  Substance and Sexual Activity  . Alcohol use: Yes    Alcohol/week: 8.4 oz    Types: 14 Cans of beer per week    Comment:    . Drug use: Yes    Types: Marijuana    Comment: occasional (01/12/17)-pt denies use  . Sexual activity: Not on file  Other Topics Concern  . Not on file  Social History Narrative  . Not on file    Review of Systems: See HPI, otherwise negative ROS  Physical Exam: BP 120/70   Pulse 81   Temp 97.7 F (36.5 C) (Temporal)   Resp 16   Ht 5\' 11"  (1.803 m)   Wt 227 lb (103 kg)   SpO2 99%   BMI 31.66 kg/m  General:   Alert,  pleasant and cooperative in NAD Head:  Normocephalic and atraumatic. Neck:  Supple; no masses or  thyromegaly. Lungs:  Clear throughout to auscultation.    Heart:  Regular rate and rhythm. Abdomen:  Soft, nontender and nondistended. Normal bowel sounds, without guarding, and without rebound.   Neurologic:  Alert and  oriented x4;  grossly normal neurologically.  Impression/Plan: BERMAN GRAINGER is now here to undergo a screening colonoscopy.  Risks, benefits, and alternatives regarding colonoscopy have been reviewed with the patient.  Questions have been answered.  All parties agreeable.

## 2017-01-21 NOTE — Anesthesia Preprocedure Evaluation (Signed)
Anesthesia Evaluation  Patient identified by MRN, date of birth, ID band Patient awake    Reviewed: Allergy & Precautions, H&P , NPO status , Patient's Chart, lab work & pertinent test results  History of Anesthesia Complications (+) history of anesthetic complications  Airway Mallampati: III  TM Distance: >3 FB Neck ROM: full    Dental no notable dental hx. (+) Missing, Poor Dentition   Pulmonary    Pulmonary exam normal breath sounds clear to auscultation       Cardiovascular hypertension, Normal cardiovascular exam Rhythm:regular Rate:Normal     Neuro/Psych PSYCHIATRIC DISORDERS CVA    GI/Hepatic   Endo/Other    Renal/GU      Musculoskeletal   Abdominal   Peds  Hematology   Anesthesia Other Findings   Reproductive/Obstetrics                             Anesthesia Physical Anesthesia Plan  ASA: III  Anesthesia Plan: General   Post-op Pain Management:    Induction:   PONV Risk Score and Plan: 2  Airway Management Planned: Natural Airway  Additional Equipment:   Intra-op Plan:   Post-operative Plan:   Informed Consent: I have reviewed the patients History and Physical, chart, labs and discussed the procedure including the risks, benefits and alternatives for the proposed anesthesia with the patient or authorized representative who has indicated his/her understanding and acceptance.   Dental Advisory Given  Plan Discussed with: CRNA  Anesthesia Plan Comments:         Anesthesia Quick Evaluation

## 2017-01-21 NOTE — Transfer of Care (Signed)
Immediate Anesthesia Transfer of Care Note  Patient: Randall Flores  Procedure(s) Performed: COLONOSCOPY WITH PROPOFOL (N/A )  Patient Location: PACU  Anesthesia Type: General  Level of Consciousness: awake, alert  and patient cooperative  Airway and Oxygen Therapy: Patient Spontanous Breathing and Patient connected to supplemental oxygen  Post-op Assessment: Post-op Vital signs reviewed, Patient's Cardiovascular Status Stable, Respiratory Function Stable, Patent Airway and No signs of Nausea or vomiting  Post-op Vital Signs: Reviewed and stable  Complications: No apparent anesthesia complications

## 2017-01-24 ENCOUNTER — Encounter: Payer: Self-pay | Admitting: Gastroenterology

## 2017-01-28 ENCOUNTER — Telehealth: Payer: Self-pay | Admitting: Gastroenterology

## 2017-01-28 NOTE — Telephone Encounter (Signed)
Patient left a voice message that he would like to get results.

## 2017-01-29 ENCOUNTER — Encounter: Payer: Self-pay | Admitting: Gastroenterology

## 2017-01-31 ENCOUNTER — Telehealth: Payer: Self-pay

## 2017-01-31 NOTE — Telephone Encounter (Signed)
Left vm for pt to return my call to schedule a procedure follow up appt.

## 2017-01-31 NOTE — Telephone Encounter (Signed)
-----   Message from [name], MD sent at 01/28/2017  3:21 PM EST ----- The patient follow-up with me in the office

## 2017-02-02 ENCOUNTER — Ambulatory Visit (INDEPENDENT_AMBULATORY_CARE_PROVIDER_SITE_OTHER): Payer: BLUE CROSS/BLUE SHIELD | Admitting: Gastroenterology

## 2017-02-02 ENCOUNTER — Other Ambulatory Visit
Admission: RE | Admit: 2017-02-02 | Discharge: 2017-02-02 | Disposition: A | Discharge status: Home / Self Care | Payer: BLUE CROSS/BLUE SHIELD | Source: Ambulatory Visit | Attending: Gastroenterology | Admitting: Gastroenterology

## 2017-02-02 ENCOUNTER — Encounter: Payer: Self-pay | Admitting: Gastroenterology

## 2017-02-02 ENCOUNTER — Other Ambulatory Visit: Payer: Self-pay

## 2017-02-02 VITALS — BP 149/74 | HR 76 | Ht 70.0 in | Wt 234.0 lb

## 2017-02-02 DIAGNOSIS — R197 Diarrhea, unspecified: Secondary | ICD-10-CM

## 2017-02-02 DIAGNOSIS — K5140 Inflammatory polyps of colon without complications: Secondary | ICD-10-CM

## 2017-02-02 NOTE — Progress Notes (Signed)
Primary Care Physician: Randall Ruths, MD  Primary Gastroenterologist:  Dr. Lucilla Flores  Chief Complaint  Patient presents with  . Follow up procedure results    HPI: Randall Flores is a 62 y.o. male here for follow-up after having colonoscopy. The patient had multiple polyps throughout the colon consistent with pseudopolyposis. The pathology showed this to be consistent with either quiescent inflammatory bowel disease versus NSAID use. The patient reports that he has a history of using a lot of NSAIDs for years. The patient also states that he gets constipated and has a hard time moving his bowels and then after that it is likely floodgates opened and he has a lot of diarrhea.  Current Outpatient Medications  Medication Sig Dispense Refill  . BEE POLLEN PO Take 500 mg by mouth daily.     . citalopram (CELEXA) 20 MG tablet Take 20 mg by mouth daily.    . clopidogrel (PLAVIX) 75 MG tablet Take by mouth.    . dicyclomine (BENTYL) 10 MG capsule Take 10 mg by mouth 4 (four) times daily -  before meals and at bedtime. Only taking twice daily    . docusate sodium (COLACE) 100 MG capsule Take 100 mg by mouth 2 (two) times daily as needed.     . DULoxetine (CYMBALTA) 60 MG capsule Take 60 mg by mouth daily.    Marland Kitchen GLUCOSAMINE-CHONDROITIN PO Take by mouth daily.    Marland Kitchen levothyroxine (SYNTHROID, LEVOTHROID) 137 MCG tablet Take 137 mcg by mouth daily before breakfast.    . lisinopril (PRINIVIL,ZESTRIL) 2.5 MG tablet Take 10 mg by mouth daily.     . Misc Natural Products (PROSTATE SUPPORT PO) Take by mouth daily.    . Multiple Vitamin (MULTIVITAMIN) capsule Take 1 capsule by mouth daily.    . ondansetron (ZOFRAN) 4 MG tablet Take 1 tablet (4 mg total) by mouth daily as needed for nausea or vomiting. (Patient not taking: Reported on 02/02/2017) 20 tablet 1   No current facility-administered medications for this visit.     Allergies as of 02/02/2017 - Review Complete 02/02/2017  Allergen  Reaction Noted  . [place] [fish allergy]  09/09/2014  . Codeine Nausea And Vomiting 09/09/2014  . Ivp dye [iodinated diagnostic agents] Nausea And Vomiting 01/12/2017  . Pravastatin  01/21/2015  . Shellfish allergy Nausea And Vomiting 01/12/2017    ROS:  General: Negative for anorexia, weight loss, fever, chills, fatigue, weakness. ENT: Negative for hoarseness, difficulty swallowing , nasal congestion. CV: Negative for chest pain, angina, palpitations, dyspnea on exertion, peripheral edema.  Respiratory: Negative for dyspnea at rest, dyspnea on exertion, cough, sputum, wheezing.  GI: See history of present illness. GU:  Negative for dysuria, hematuria, urinary incontinence, urinary frequency, nocturnal urination.  Endo: Negative for unusual weight change.    Physical Examination:   BP (!) 149/74   Pulse 76   Ht 5\' 10"  (1.778 m)   Wt 234 lb (106.1 kg)   BMI 33.58 kg/m   General: Well-nourished, well-developed in no acute distress.  Eyes: No icterus. Conjunctivae pink. Extremities: No lower extremity edema. No clubbing or deformities. Neuro: Alert and oriented x 3.  Grossly intact. Skin: Warm and dry, no jaundice.   Psych: Alert and cooperative, normal mood and affect.  Labs:    Imaging Studies: No results found.  Assessment and Plan:   Randall Flores is a 62 y.o. y/o male who had multiple polyps/pseudopolyposis seen on his colonoscopy. The patient's biopsy did not  show any adenomatous changes. The biopsies were consistent with either NSAID use versus inactive ulcerative colitis. The patient will have his blood sent off for a IBD panel. His also been told to avoid NSAIDs and he has also been told to start daily Citrucel to help with his intermittent constipation. The patient has been explained the plan and agrees with it.    Randall Lame, MD. [name]   Note: This dictation was prepared with Dragon dictation along with smaller phrase technology. Any transcriptional errors that  result from this process are unintentional.

## 2017-02-04 LAB — INFLAMMATORY BOWEL DISEASE-IBD
Atypical P-ANCA titer: <1:20 {titer}
Saccharomyces cerevisiae, IgA: 21.9 Units (ref 0.0–24.9)

## 2017-02-14 ENCOUNTER — Telehealth: Payer: Self-pay

## 2017-02-14 NOTE — Telephone Encounter (Signed)
-----   Message from [name], MD sent at 02/07/2017 11:55 AM EST ----- Let the patient know that his blood test came back negative for inflammatory bowel disease. He should continue to avoid anti-inflammatory medication.

## 2017-02-14 NOTE — Telephone Encounter (Signed)
Pt notified of IBD panel results.

## 2017-04-22 ENCOUNTER — Encounter: Payer: Self-pay | Admitting: Emergency Medicine

## 2017-04-22 ENCOUNTER — Other Ambulatory Visit: Payer: Self-pay

## 2017-04-22 ENCOUNTER — Emergency Department
Admission: EM | Admit: 2017-04-22 | Discharge: 2017-04-22 | Disposition: A | Discharge status: Home / Self Care | Payer: BLUE CROSS/BLUE SHIELD | Attending: Emergency Medicine | Admitting: Emergency Medicine

## 2017-04-22 ENCOUNTER — Emergency Department: Payer: BLUE CROSS/BLUE SHIELD

## 2017-04-22 DIAGNOSIS — R42 Dizziness and giddiness: Secondary | ICD-10-CM | POA: Insufficient documentation

## 2017-04-22 DIAGNOSIS — W19XXXA Unspecified fall, initial encounter: Secondary | ICD-10-CM | POA: Diagnosis not present

## 2017-04-22 DIAGNOSIS — Z8679 Personal history of other diseases of the circulatory system: Secondary | ICD-10-CM | POA: Insufficient documentation

## 2017-04-22 DIAGNOSIS — R55 Syncope and collapse: Secondary | ICD-10-CM | POA: Diagnosis not present

## 2017-04-22 DIAGNOSIS — Z79899 Other long term (current) drug therapy: Secondary | ICD-10-CM | POA: Insufficient documentation

## 2017-04-22 DIAGNOSIS — Z8673 Personal history of transient ischemic attack (TIA), and cerebral infarction without residual deficits: Secondary | ICD-10-CM | POA: Insufficient documentation

## 2017-04-22 DIAGNOSIS — Z7902 Long term (current) use of antithrombotics/antiplatelets: Secondary | ICD-10-CM | POA: Diagnosis not present

## 2017-04-22 DIAGNOSIS — M5412 Radiculopathy, cervical region: Secondary | ICD-10-CM | POA: Diagnosis not present

## 2017-04-22 DIAGNOSIS — M25511 Pain in right shoulder: Secondary | ICD-10-CM | POA: Diagnosis not present

## 2017-04-22 DIAGNOSIS — I10 Essential (primary) hypertension: Secondary | ICD-10-CM | POA: Insufficient documentation

## 2017-04-22 LAB — BASIC METABOLIC PANEL
Anion gap: 10 (ref 5–15)
BUN: 16 mg/dL (ref 6–20)
CO2: 24 mmol/L (ref 22–32)
CREATININE: 0.97 mg/dL (ref 0.61–1.24)
Calcium: 9.3 mg/dL (ref 8.9–10.3)
Chloride: 102 mmol/L (ref 101–111)
GFR calc Af Amer: >60 mL/min (ref >60)
GFR calc non Af Amer: >60 mL/min (ref >60)
Glucose, Bld: 106 mg/dL — ABNORMAL HIGH (ref 65–99)
Potassium: 4.2 mmol/L (ref 3.5–5.1)
SODIUM: 136 mmol/L (ref 135–145)

## 2017-04-22 LAB — CBC
HCT: 39.7 % — ABNORMAL LOW (ref 40.0–52.0)
Hemoglobin: 13.7 g/dL (ref 13.0–18.0)
MCH: 30.7 pg (ref 26.0–34.0)
MCHC: 34.4 g/dL (ref 32.0–36.0)
MCV: 89.4 fL (ref 80.0–100.0)
PLATELETS: 403 10*3/uL (ref 150–440)
RBC: 4.45 MIL/uL (ref 4.40–5.90)
RDW: 13.4 % (ref 11.5–14.5)
WBC: 7.8 10*3/uL (ref 3.8–10.6)

## 2017-04-22 LAB — TROPONIN I

## 2017-04-22 MED ORDER — KETOROLAC TROMETHAMINE 30 MG/ML IJ SOLN
30.0000 mg | Freq: Once | INTRAMUSCULAR | Status: DC
  Filled 2017-04-22: qty 1

## 2017-04-22 MED ORDER — SODIUM CHLORIDE 0.9 % IV SOLN
1000.0000 mL | Freq: Once | INTRAVENOUS | Status: AC
  Administered 2017-04-22: 1000 mL via INTRAVENOUS

## 2017-04-22 MED ORDER — ONDANSETRON HCL 4 MG/2ML IJ SOLN
4.0000 mg | Freq: Once | INTRAMUSCULAR | Status: AC
  Administered 2017-04-22: 4 mg via INTRAVENOUS
  Filled 2017-04-22: qty 2

## 2017-04-22 MED ORDER — FENTANYL CITRATE (PF) 100 MCG/2ML IJ SOLN
25.0000 ug | Freq: Once | INTRAMUSCULAR | Status: AC
  Administered 2017-04-22: 25 ug via INTRAVENOUS
  Filled 2017-04-22: qty 2

## 2017-04-22 MED ORDER — DEXAMETHASONE SODIUM PHOSPHATE 10 MG/ML IJ SOLN: 10.0000 mg | Freq: Once | INTRAMUSCULAR | Status: DC

## 2017-04-22 MED ORDER — DEXAMETHASONE SODIUM PHOSPHATE 10 MG/ML IJ SOLN
10.0000 mg | Freq: Once | INTRAMUSCULAR | Status: AC
  Administered 2017-04-22: 10 mg via INTRAVENOUS
  Filled 2017-04-22: qty 1

## 2017-04-22 MED ORDER — HYDROCODONE-ACETAMINOPHEN 5-325 MG PO TABS
2.0000 | ORAL_TABLET | Freq: Once | ORAL | Status: AC
  Administered 2017-04-22: 2 via ORAL
  Filled 2017-04-22: qty 2

## 2017-04-22 MED ORDER — HYDROCODONE-ACETAMINOPHEN 5-325 MG PO TABS: 2.0000 | ORAL_TABLET | Freq: Once | ORAL | Status: DC

## 2017-04-22 MED ORDER — HYDROCODONE-ACETAMINOPHEN 5-325 MG PO TABS: 1.0000 | ORAL_TABLET | ORAL | 0 refills | Status: DC | PRN

## 2017-04-22 NOTE — ED Triage Notes (Signed)
Pt to ED via EMS from home with c/o syncopal episode leaving PCP for wellness check. PT has mechanical fall last week and c/o RT shoulder pain. PT has Asprin at home prior to EMS arrival. VSS

## 2017-04-22 NOTE — ED Notes (Signed)
Pt discharged to home.  Family member driving.  Discharge instructions reviewed.  Verbalized understanding.  No questions or concerns at this time.  Teach back verified.  Pt in NAD.  No items left in ED.   

## 2017-04-22 NOTE — ED Notes (Signed)
EDP reassessed patient and gave VO to this RN for decadron 10mg  IV push and 2 tablets of vicodin 5-325mg  PO at this time.

## 2017-04-22 NOTE — ED Provider Notes (Signed)
[facility] Emergency Department Provider Note   ____________________________________________    I have reviewed the triage vital signs and the nursing notes.   HISTORY  Chief Complaint Near Syncope   HPI Randall Flores is a 62 y.o. male who presents after a near syncopal episode.  Patient reports that one week ago he fell backwards while at work and fell flat on his back, he had some lower neck pain after that incident but continued to work throughout the week.  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  He reports the pain would frequently radiate from his neck all the way down his arm and be quite severe.  He states it feels similar to when he had a pinched nerve in his back.  He is not take anything for this.  He told his supervisor who had him sit and rest while he checked his blood pressure and when they found to be mildly elevated decided he needed to go to the emergency department, was walking to the car with a friend when he felt quite lightheaded.  They lied him down on the ground and called EMS.  No chest pain or shortness of breath.  No calf pain or swelling.  No weakness or neuro deficits  Past Medical History:  Diagnosis Date  . Anxiety   . Arthritis    knees, back, hands  . Colon polyp   . Complication of anesthesia    wakes up during anesthesia  . Depression   . Diverticulitis   . Heart murmur   . Hypertension   . Stroke [facility]) 2015   TIA/right side affected  . Thyroid disease   . Wears dentures    partial upper, front    Patient Active Problem List   Diagnosis Date Noted  . Colon cancer screening   . Pseudopolyposis of colon without complication ([place])   . Abdominal pain, right upper quadrant 02/06/2015  . Right lower quadrant abdominal pain 02/06/2015    Past Surgical History:  Procedure Laterality Date  . [place], 2001   [place]  . COLONOSCOPY     [place]  . COLONOSCOPY WITH PROPOFOL N/A 01/21/2017   Procedure: COLONOSCOPY WITH PROPOFOL;  Surgeon: [name], MD;  Location: [place];  Service: Endoscopy;  Laterality: N/A;  . POLYPECTOMY  01/21/2017   Procedure: POLYPECTOMY;  Surgeon: [name], MD;  Location: [place];  Service: Endoscopy;;    Prior to Admission medications   Medication Sig Start Date End Date Taking? Authorizing Provider  BEE POLLEN PO Take 500 mg by mouth daily.     [provider]  citalopram (CELEXA) 20 MG tablet Take 20 mg by mouth daily.    [provider]  clopidogrel (PLAVIX) 75 MG tablet Take by mouth. 07/30/14   [provider]  dicyclomine (BENTYL) 10 MG capsule Take 10 mg by mouth 4 (four) times daily -  before meals and at bedtime. Only taking twice daily    [provider]  docusate sodium (COLACE) 100 MG capsule Take 100 mg by mouth 2 (two) times daily as needed.     [provider]  DULoxetine (CYMBALTA) 60 MG capsule Take 60 mg by mouth daily.    [provider]  GLUCOSAMINE-CHONDROITIN PO Take by mouth daily.    [provider]  HYDROcodone-acetaminophen (NORCO/VICODIN)  5-325 MG tablet Take 1 tablet by mouth every 4 (four) hours as needed for moderate pain. 04/22/17   [name], MD  levothyroxine (SYNTHROID, LEVOTHROID) 137 MCG tablet Take 137 mcg by mouth daily before breakfast.    [provider]  lisinopril (PRINIVIL,ZESTRIL) 2.5 MG tablet Take 10 mg by mouth daily.  07/30/14   [provider]  Misc Natural Products (PROSTATE SUPPORT PO) Take by mouth daily.    [provider]  Multiple Vitamin (MULTIVITAMIN) capsule Take 1 capsule by mouth daily.    [provider]  ondansetron (ZOFRAN) 4 MG tablet Take 1 tablet (4 mg total) by mouth daily as needed for nausea or vomiting. Patient not taking: Reported on 02/02/2017 04/24/15    [name], MD     Allergies [facility] allergy]; Codeine; Ivp dye [iodinated diagnostic agents]; Pravastatin; and Shellfish allergy  Family History  Problem Relation Age of Onset  . Alzheimer's disease Mother     Social History Social History   Tobacco Use  . Smoking status: Never Smoker  . Smokeless tobacco: Never Used  Substance Use Topics  . Alcohol use: Yes    Alcohol/week: 8.4 oz    Types: 14 Cans of beer per week    Comment:    . Drug use: Yes    Types: Marijuana    Comment: occasional (01/12/17)-pt denies use    Review of Systems  Constitutional: No fever/chills Eyes: No visual changes.  ENT: Mild low neck pain Cardiovascular: Denies chest pain.  Palpitations Respiratory: Denies shortness of breath. Gastrointestinal: No abdominal pain.  No nausea, no vomiting.   Genitourinary: Negative for dysuria. Musculoskeletal: Negative for back pain.  Skin: Negative for rash. Neurological: Negative for headaches   ____________________________________________   PHYSICAL EXAM:  VITAL SIGNS: ED Triage Vitals  Enc Vitals Group     BP 04/22/17 1511 (!) 149/92     Pulse Rate 04/22/17 1511 78     Resp 04/22/17 1511 16     Temp 04/22/17 1511 98.5 F (36.9 C)     Temp Source 04/22/17 1511 Oral     SpO2 04/22/17 1511 95 %     Weight 04/22/17 1507 107 kg (236 lb)     Height 04/22/17 1507 1.778 m (5\' 10" )     Head Circumference --      Peak Flow --      Pain Score 04/22/17 1507 0     Pain Loc --      Pain Edu? --      Excl. in [place]? --     Constitutional: Alert and oriented. No acute distress.  Eyes: Conjunctivae are normal.  Head: Atraumatic.  No swelling Nose: No congestion/rhinnorhea. Mouth/Throat: Mucous membranes are moist.   Neck:  Painless ROM, mild tenderness approximately C7 Cardiovascular: Normal rate, regular rhythm. Grossly normal heart sounds.  Good peripheral circulation. Respiratory: Normal respiratory effort.  No retractions.    Gastrointestinal: Soft and nontender. No distention.  No CVA tenderness. Genitourinary: deferred Musculoskeletal: Left arm: Normal range of motion, warm and well perfused.  Right arm/shoulder, tenderness to palpation over the right lateral trapezius which causes significant pain.  Patient is able to abduct his arm but it is painful.  2+ distal pulses, no bony ab normalities. Neurologic:  Normal speech and language. No gross focal neurologic deficits are appreciated.  Skin:  Skin is warm, dry and intact. No rash noted. Psychiatric: Mood and affect are normal. Speech and behavior are normal.  ____________________________________________  LABS (all labs ordered are listed, but only abnormal results are displayed)  Labs Reviewed  CBC - Abnormal; Notable for the following components:      Result Value   HCT 39.7 (*)    All other components within normal limits  BASIC METABOLIC PANEL - Abnormal; Notable for the following components:   Glucose, Bld 106 (*)    All other components within normal limits  TROPONIN I   ____________________________________________  EKG  ED ECG REPORT I, [name], the attending physician, personally viewed and interpreted this ECG.  Date: 04/22/2017  Rhythm: normal sinus rhythm QRS Axis: normal Intervals: normal ST/T Wave abnormalities: normal Narrative Interpretation: no evidence of acute ischemia  ____________________________________________  RADIOLOGY  Chest x-ray Shoulder x-ray CT cervical spine ____________________________________________   PROCEDURES  Procedure(s) performed: No  Procedures   Critical Care performed: No ____________________________________________   INITIAL IMPRESSION / ASSESSMENT AND PLAN / ED COURSE  Pertinent labs & imaging results that were available during my care of the patient were reviewed by me and considered in my medical decision making (see chart for details).  Patient presents with right arm  pain which appears to be most consistent with a radiculopathy, his pain is improved significantly.  I suspect he had a vasovagal response secondary to the pain.  Lab work is unremarkable.  Vital signs are reassuring.  EKG troponin normal.  Imaging demonstrates arthritis but no bony injuries.  Will refer to PCP as well as neurosurgery for further evaluation.  Patient treated with IV fentanyl and Zofran.  Also given 10 mg of IV Decadron    ____________________________________________   FINAL CLINICAL IMPRESSION(S) / ED DIAGNOSES  Final diagnoses:  Near syncope  Cervical radiculopathy        Note:  This document was prepared using Dragon voice recognition software and may include unintentional dictation errors.    [name], MD 04/22/17 [phone]

## 2017-04-25 DIAGNOSIS — R55 Syncope and collapse: Secondary | ICD-10-CM

## 2017-04-25 HISTORY — DX: Syncope and collapse: R55

## 2017-05-24 DIAGNOSIS — I350 Nonrheumatic aortic (valve) stenosis: Secondary | ICD-10-CM

## 2017-05-24 HISTORY — DX: Nonrheumatic aortic (valve) stenosis: I35.0

## 2017-10-04 ENCOUNTER — Ambulatory Visit: Payer: Self-pay | Admitting: Urology

## 2017-10-06 ENCOUNTER — Telehealth: Payer: Self-pay | Admitting: Urology

## 2017-10-06 ENCOUNTER — Ambulatory Visit (INDEPENDENT_AMBULATORY_CARE_PROVIDER_SITE_OTHER): Payer: BLUE CROSS/BLUE SHIELD | Admitting: Urology

## 2017-10-06 ENCOUNTER — Encounter: Payer: Self-pay | Admitting: Urology

## 2017-10-06 VITALS — BP 127/75 | HR 86 | Ht 70.0 in | Wt 220.0 lb

## 2017-10-06 DIAGNOSIS — N401 Enlarged prostate with lower urinary tract symptoms: Secondary | ICD-10-CM

## 2017-10-06 DIAGNOSIS — N138 Other obstructive and reflux uropathy: Secondary | ICD-10-CM

## 2017-10-06 DIAGNOSIS — R972 Elevated prostate specific antigen [PSA]: Secondary | ICD-10-CM | POA: Diagnosis not present

## 2017-10-06 DIAGNOSIS — R351 Nocturia: Secondary | ICD-10-CM

## 2017-10-06 LAB — URINALYSIS, COMPLETE
Bilirubin, UA: NEGATIVE
GLUCOSE, UA: NEGATIVE
KETONES UA: NEGATIVE
Leukocytes, UA: NEGATIVE
NITRITE UA: NEGATIVE
Protein, UA: NEGATIVE
Specific Gravity, UA: 1.02 (ref 1.005–1.030)
UUROB: 0.2 mg/dL (ref 0.2–1.0)
pH, UA: 5.5 (ref 5.0–7.5)

## 2017-10-06 LAB — MICROSCOPIC EXAMINATION
Bacteria, UA: NONE SEEN
EPITHELIAL CELLS (NON RENAL): NONE SEEN /HPF (ref 0–10)
WBC UA: NONE SEEN /HPF (ref 0–5)

## 2017-10-06 MED ORDER — TAMSULOSIN HCL 0.4 MG PO CAPS: 0.4000 mg | ORAL_CAPSULE | Freq: Every day | ORAL | 3 refills | Status: DC

## 2017-10-06 MED ORDER — TAMSULOSIN HCL 0.4 MG PO CAPS: 0.4000 mg | ORAL_CAPSULE | Freq: Every day | ORAL | 0 refills | Status: DC

## 2017-10-06 NOTE — Progress Notes (Signed)
10/06/2017 9:01 AM   Randall Flores Nov 25, 1955 [id number]  Referring provider: [name], MD [facility] [place], [place] [address]  Chief Complaint  Patient presents with  . Elevated PSA    New Patient    HPI: 62 year old male referred for further evaluation of elevated PSA.  He was referred by his primary care physician, Dr. Ouida Sills.  He underwent routine annual PSA screening on 08/15/2017 with a PSA of 6.06.  This is up significantly from 2 years ago in 03/2015 when his PSA was 3.12.  Other than this, there are no previous PSA values for review.  He denies a family history of prostate cancer.  No weight loss or bone pain.  He does have severe urinary symptoms.  He has a weak stream which sometimes he describes as a dribble with sensation of occasional incomplete bladder emptying the daytime urgency and frequency.  This been going on for at least a year.  Prior to this, he did have urinary symptoms but over the past year has had significant progression.  In addition to this, he gets up about 4 times at night to void.  This very bothersome.  He is not currently on any BPH meds.  He is never tried any of these medicines before.  He does snore loudly and is no longer able to sleep in the bed with his wife.  His wife told him that he is to sit up straight in bed and be gasping for air.  He is never had a sleep study.  He is on Plavix for history of stroke.    IPSS    Row Name 10/06/17 0800         International Prostate Symptom Score   How often have you had the sensation of not emptying your bladder?  About half the time     How often have you had to urinate less than every two hours?  More than half the time     How often have you found you stopped and started again several times when you urinated?  Almost always     How often have you found it difficult to postpone urination?  More than half the time     How often have you had a  weak urinary stream?  Almost always     How often have you had to strain to start urination?  About half the time     How many times did you typically get up at night to urinate?  4 Times     Total IPSS Score  28       Quality of Life due to urinary symptoms   If you were to spend the rest of your life with your urinary condition just the way it is now how would you feel about that?  Unhappy        Score:  1-7 Mild 8-19 Moderate 20-35 Severe   PMH: Past Medical History:  Diagnosis Date  . Anxiety   . Arthritis    knees, back, hands  . Colon polyp   . Complication of anesthesia    wakes up during anesthesia  . Depression   . Diverticulitis   . Heart murmur   . Hypertension   . Stroke [facility]) 2015   TIA/right side affected  . Thyroid disease   . Wears dentures    partial upper, front    Surgical History: Past Surgical History:  Procedure Laterality  Date  . [place], 2001   [place]  . COLONOSCOPY     [place]  . COLONOSCOPY WITH PROPOFOL N/A 01/21/2017   Procedure: COLONOSCOPY WITH PROPOFOL;  Surgeon: [name], MD;  Location: [place];  Service: Endoscopy;  Laterality: N/A;  . POLYPECTOMY  01/21/2017   Procedure: POLYPECTOMY;  Surgeon: [name], MD;  Location: [place];  Service: Endoscopy;;    Home Medications:  Allergies as of 10/06/2017      Reactions   Cod [fish Allergy]    Codeine Nausea And Vomiting   Ivp Dye [iodinated Diagnostic Agents] Nausea And Vomiting   Also felt very hot/flushed   Pravastatin    Other reaction(s): Muscle Pain   Shellfish Allergy Nausea And Vomiting      Medication List        Accurate as of 10/06/17  9:01 AM. Always use your most recent med list.          BEE POLLEN PO Take 500 mg by mouth daily.   citalopram 20 MG tablet Commonly known as:  CELEXA Take 20 mg by mouth daily.   clopidogrel 75 MG tablet Commonly known as:  PLAVIX Take by mouth.   dicyclomine 10 MG  capsule Commonly known as:  BENTYL Take 10 mg by mouth 4 (four) times daily -  before meals and at bedtime. Only taking twice daily   DULoxetine 60 MG capsule Commonly known as:  CYMBALTA Take 60 mg by mouth daily.   levothyroxine 137 MCG tablet Commonly known as:  SYNTHROID, LEVOTHROID Take 137 mcg by mouth daily before breakfast.   lisinopril 2.5 MG tablet Commonly known as:  PRINIVIL,ZESTRIL Take 10 mg by mouth daily.   multivitamin capsule Take 1 capsule by mouth daily.   tamsulosin 0.4 MG Caps capsule Commonly known as:  FLOMAX Take 1 capsule (0.4 mg total) by mouth daily.       Allergies:  Allergies  Allergen Reactions  . Cod [facility] Allergy]   . Codeine Nausea And Vomiting  . Ivp Dye [Iodinated Diagnostic Agents] Nausea And Vomiting    Also felt very hot/flushed  . Pravastatin     Other reaction(s): Muscle Pain  . Shellfish Allergy Nausea And Vomiting    Family History: Family History  Problem Relation Age of Onset  . Alzheimer's disease Mother     Social History:  reports that he has never smoked. He has never used smokeless tobacco. He reports that he drinks about 14.0 standard drinks of alcohol per week. He reports that he has current or past drug history. Drug: Marijuana.  ROS: UROLOGY Frequent Urination?: Yes Hard to postpone urination?: Yes Burning/pain with urination?: No Get up at night to urinate?: Yes Leakage of urine?: No Urine stream starts and stops?: Yes Trouble starting stream?: Yes Do you have to strain to urinate?: No Blood in urine?: No Urinary tract infection?: No Sexually transmitted disease?: No Injury to kidneys or bladder?: No Painful intercourse?: No Weak stream?: Yes Erection problems?: Yes Penile pain?: No  Gastrointestinal Nausea?: No Vomiting?: No Indigestion/heartburn?: No Diarrhea?: No Constipation?: Yes  Constitutional Fever: No Night sweats?: No Weight loss?: No Fatigue?: No  Skin Skin rash/lesions?:  No Itching?: No  Eyes Blurred vision?: No Double vision?: No  Ears/Nose/Throat Sore throat?: No Sinus problems?: No  Hematologic/Lymphatic Swollen glands?: No Easy bruising?: Yes  Cardiovascular Leg swelling?: No Chest pain?: No  Respiratory Cough?: No Shortness of breath?: No  Endocrine Excessive thirst?: No  Musculoskeletal Back  pain?: Yes Joint pain?: Yes  Neurological Headaches?: No Dizziness?: No  Psychologic Depression?: Yes Anxiety?: Yes  Physical Exam: BP 127/75   Pulse 86   Ht 5\' 10"  (1.778 m)   Wt 220 lb (99.8 kg)   BMI 31.57 kg/m   Constitutional:  Alert and oriented, No acute distress. HEENT: [place] AT, moist mucus membranes.  Trachea midline, no masses. Cardiovascular: No clubbing, cyanosis, or edema. Respiratory: Normal respiratory effort, no increased work of breathing. GI: Abdomen is soft, nontender, nondistended, no abdominal masses GU: No CVA tenderness Rectal: Normal sphincter tone.  50+ cc prostate markedly enlarged with rubbery lateral lobes, no nodules appreciated. Skin: No rashes, bruises or suspicious lesions. Neurologic: Grossly intact, no focal deficits, moving all 4 extremities. Psychiatric: Normal mood and affect.  Laboratory Data: Lab Results  Component Value Date   WBC 7.8 04/22/2017   HGB 13.7 04/22/2017   HCT 39.7 (L) 04/22/2017   MCV 89.4 04/22/2017   PLT 403 04/22/2017    Lab Results  Component Value Date   CREATININE 0.97 04/22/2017   PSA as above  Hemoglobin A1c 6.4  Urinalysis UA reviewed today, negative, see epic for details.  Pertinent Imaging: Results for orders placed during the hospital encounter of 06/08/16  CT RENAL STONE STUDY   Narrative CLINICAL DATA:  Lower abdominal pain for 2 weeks.  Nausea, vomiting.  EXAM: CT ABDOMEN AND PELVIS WITHOUT CONTRAST  TECHNIQUE: Multidetector CT imaging of the abdomen and pelvis was performed following the standard protocol without IV  contrast.  COMPARISON:  MRI 02/03/2015.  FINDINGS: Lower chest: Lung bases are clear. No effusions. Heart is normal size.  Hepatobiliary: Left hepatic cysts again noted, stable. Gallbladder unremarkable.  Pancreas: No focal abnormality or ductal dilatation.  Spleen: No focal abnormality.  Normal size.  Adrenals/Urinary Tract: Mild diffuse adrenal gland thickening compatible hyperplasia. No renal or ureteral stones. No hydronephrosis. Urinary bladder is decompressed.  Stomach/Bowel: Diffuse wall thickening and surrounding inflammatory change involving the colon from the proximal transverse colon to the sigmoid colon compatible with colitis. Appendix is normal. Stomach and small bowel are decompressed. Scattered colonic diverticula.  Vascular/Lymphatic: No evidence of aneurysm or adenopathy.  Reproductive: Prostate enlargement.  Other: No free fluid or free air.  Musculoskeletal: No acute bony abnormality.  IMPRESSION: Evidence of colitis from the proximal transverse colon to the sigmoid colon with wall thickening and surrounding inflammatory change.  Scattered colonic diverticulosis, most pronounced in the sigmoid colon.  Normal appendix.  Stable left hepatic cyst.   Electronically Signed   By: Rolm Baptise M.D.   On: 06/08/2016 09:28     Assessment & Plan:    1. Elevated PSA  We reviewed the implications of an elevated PSA and the uncertainty surrounding it. In general, a man's PSA increases with age and is produced by both normal and cancerous prostate tissue. Differential for elevated PSA is BPH, prostate cancer, infection, recent intercourse/ejaculation, prostate infarction, recent urethroscopic manipulation (foley placement/cystoscopy) and prostatitis. Management of an elevated PSA can include observation or prostate biopsy and wediscussed this in detail. We discussed that indications for prostate biopsy are defined by age and race specific PSA cutoffs as  well as a PSA velocity of 0.75/year.  PSA was repeated today and is pending.  We will advise further follow-up based on his repeat PSA value.     - Urinalysis, Complete - PSA  2. BPH with urinary obstruction Significant prostamegaly on exam today with both obstructive and irritative voiding symptoms We discussed  that he ultimately will likely need an outlet procedure but in the interim, we will go ahead and start Flomax to see if this helps Follow-up closely in 3 months  3. Nocturia We discussed that nighttime urinary symptoms may be related to his prostate/bladder issues but also may be exacerbated by underlying sleep apnea He does have symptoms of sleep apnea including snoring, gasping for air at night I have encouraged him to discuss this with his primary care physician consider a sleep study  Return in about 3 months (around 01/05/2018) for IPSS/ PVR/ possible PSA.  Hollice Espy, MD  [facility] [address] [place],  [address] [phone]

## 2017-10-06 NOTE — Telephone Encounter (Signed)
At checkout, pt asked to let staff know that he is not allowed to have his cell phone on the job with him, but that he would like a very detailed message about his lab (PSA) results left on his cell voice mail. FYI

## 2017-10-07 LAB — PSA: PROSTATE SPECIFIC AG, SERUM: 4.6 ng/mL — AB (ref 0.0–4.0)

## 2017-10-10 ENCOUNTER — Telehealth: Payer: Self-pay

## 2017-10-10 ENCOUNTER — Telehealth: Payer: Self-pay | Admitting: Urology

## 2017-10-10 NOTE — Telephone Encounter (Signed)
Called pt no answer. LM for pt informing him of information below, per pt's request asked that we leave results on VM. Advised pt to keep f/u in December.

## 2017-10-10 NOTE — Telephone Encounter (Signed)
Pt called asking for PSA results.  Please call pt, O.K. To leave detailed message on v/m.

## 2017-10-10 NOTE — Telephone Encounter (Signed)
-----   Message from Hollice Espy, MD sent at 10/09/2017  4:25 PM EDT ----- PSA is trending down.  That's good news.  Lets recheck in 12/2017 at your follow up and keep a close eye on it.    Hollice Espy, MD

## 2017-10-10 NOTE — Telephone Encounter (Signed)
Previously addressed.

## 2018-01-06 ENCOUNTER — Other Ambulatory Visit: Payer: Self-pay | Admitting: Family Medicine

## 2018-01-06 ENCOUNTER — Other Ambulatory Visit: Payer: BLUE CROSS/BLUE SHIELD

## 2018-01-06 DIAGNOSIS — R972 Elevated prostate specific antigen [PSA]: Secondary | ICD-10-CM

## 2018-01-09 ENCOUNTER — Other Ambulatory Visit: Payer: BLUE CROSS/BLUE SHIELD

## 2018-01-09 DIAGNOSIS — R972 Elevated prostate specific antigen [PSA]: Secondary | ICD-10-CM

## 2018-01-10 ENCOUNTER — Encounter: Payer: Self-pay | Admitting: Urology

## 2018-01-10 ENCOUNTER — Ambulatory Visit (INDEPENDENT_AMBULATORY_CARE_PROVIDER_SITE_OTHER): Payer: BLUE CROSS/BLUE SHIELD | Admitting: Urology

## 2018-01-10 VITALS — BP 127/74 | HR 92 | Ht 70.0 in | Wt 237.0 lb

## 2018-01-10 DIAGNOSIS — N401 Enlarged prostate with lower urinary tract symptoms: Secondary | ICD-10-CM

## 2018-01-10 DIAGNOSIS — R972 Elevated prostate specific antigen [PSA]: Secondary | ICD-10-CM | POA: Diagnosis not present

## 2018-01-10 DIAGNOSIS — N5203 Combined arterial insufficiency and corporo-venous occlusive erectile dysfunction: Secondary | ICD-10-CM | POA: Diagnosis not present

## 2018-01-10 DIAGNOSIS — N138 Other obstructive and reflux uropathy: Secondary | ICD-10-CM

## 2018-01-10 LAB — PSA: Prostate Specific Ag, Serum: 6.7 ng/mL — ABNORMAL HIGH (ref 0.0–4.0)

## 2018-01-10 LAB — BLADDER SCAN AMB NON-IMAGING

## 2018-01-10 MED ORDER — SILDENAFIL CITRATE 20 MG PO TABS: 20.0000 mg | ORAL_TABLET | ORAL | 11 refills | Status: DC | PRN

## 2018-01-10 NOTE — Progress Notes (Signed)
01/10/2018 1:13 PM   Randall Flores [birth year] [id number]  Referring provider: [name], MD [facility] [place], [place] [address]  Chief Complaint  Patient presents with  . Benign Prostatic Hypertrophy    3 month followup    HPI: 62 yo M with presentation of elevated PSA and BPH with severe urinary symptoms returns today for 55-month follow-up.  He was initially seen and evaluated 3 months ago for elevated PSA.  PSA trend as below.  This was repeated today and it was more significantly elevated to 6.7 up from his previous of 4.6.  Prostate exam last visit showed prostamegaly without nodules.  Last visit, he had severe urinary symptoms which include weak stream, sensation of incomplete bladder emptying, and nocturia x4 which is very bothersome to him.  He was started on Flomax.  Symptoms remain severe.  He feels like his flow / stream is improved slightly but his urgency, frequency and nocturia remains severe.  He voids every hour.    PVR is elevated to 116 today.  He voided again just after his bladder scan, approximately the same amount.    He also mentions today that he has a history of urethral stricture.  He reports that when he was a teenager, this had to be dilated.  He also reports today that he has ED for the last year.  He has not been streated for this.  He has had a stress test a year ago per the patient.    He has a duplicated left collecting system.   PSA trend: 12/2017 6.7 09/2017 4.6 07/2017 6.06 03/2015 3.12  IPSS    Row Name 01/10/18 0800         International Prostate Symptom Score   How often have you had the sensation of not emptying your bladder?  More than half the time     How often have you had to urinate less than every two hours?  More than half the time     How often have you found you stopped and started again several times when you urinated?  Less than half the time     How often have you found it  difficult to postpone urination?  Almost always     How often have you had a weak urinary stream?  About half the time     How often have you had to strain to start urination?  Not at All     How many times did you typically get up at night to urinate?  4 Times     Total IPSS Score  22       Quality of Life due to urinary symptoms   If you were to spend the rest of your life with your urinary condition just the way it is now how would you feel about that?  Unhappy        Score:  1-7 Mild 8-19 Moderate 20-35 Severe  PMH: Past Medical History:  Diagnosis Date  . Anxiety   . Arthritis    knees, back, hands  . Colon polyp   . Complication of anesthesia    wakes up during anesthesia  . Depression   . Diverticulitis   . Heart murmur   . Hypertension   . Stroke [facility]) 2015   TIA/right side affected  . Thyroid disease   . Wears dentures    partial upper, front    Surgical History: Past Surgical History:  Procedure Laterality Date  . [place], 2001   [place]  . COLONOSCOPY     [place]  . COLONOSCOPY WITH PROPOFOL N/A 01/21/2017   Procedure: COLONOSCOPY WITH PROPOFOL;  Surgeon: [name], MD;  Location: [place];  Service: Endoscopy;  Laterality: N/A;  . POLYPECTOMY  01/21/2017   Procedure: POLYPECTOMY;  Surgeon: [name], MD;  Location: [place];  Service: Endoscopy;;    Home Medications:  Allergies as of 01/10/2018      Reactions   Cod [fish Allergy]    Codeine Nausea And Vomiting   Ivp Dye [iodinated Diagnostic Agents] Nausea And Vomiting   Also felt very hot/flushed   Pravastatin    Other reaction(s): Muscle Pain   Shellfish Allergy Nausea And Vomiting      Medication List       Accurate as of January 10, 2018  1:13 PM. Always use your most recent med list.        BEE POLLEN PO Take 500 mg by mouth daily.   citalopram 20 MG tablet Commonly known as:  CELEXA Take 20 mg by mouth daily.   clopidogrel 75 MG  tablet Commonly known as:  PLAVIX Take by mouth.   dicyclomine 10 MG capsule Commonly known as:  BENTYL Take 10 mg by mouth 4 (four) times daily -  before meals and at bedtime. Only taking twice daily   DULoxetine 60 MG capsule Commonly known as:  CYMBALTA Take 60 mg by mouth daily.   levothyroxine 137 MCG tablet Commonly known as:  SYNTHROID, LEVOTHROID Take 137 mcg by mouth daily before breakfast.   lisinopril 2.5 MG tablet Commonly known as:  PRINIVIL,ZESTRIL Take 10 mg by mouth daily.   multivitamin capsule Take 1 capsule by mouth daily.   sildenafil 20 MG tablet Commonly known as:  REVATIO Take 1 tablet (20 mg total) by mouth as needed. Take 1-5 tabs as needed prior to intercourse   tamsulosin 0.4 MG Caps capsule Commonly known as:  FLOMAX Take 1 capsule (0.4 mg total) by mouth daily.       Allergies:  Allergies  Allergen Reactions  . Cod [facility] Allergy]   . Codeine Nausea And Vomiting  . Ivp Dye [Iodinated Diagnostic Agents] Nausea And Vomiting    Also felt very hot/flushed  . Pravastatin     Other reaction(s): Muscle Pain  . Shellfish Allergy Nausea And Vomiting    Family History: Family History  Problem Relation Age of Onset  . Alzheimer's disease Mother     Social History:  reports that he has never smoked. He has never used smokeless tobacco. He reports current alcohol use of about 14.0 standard drinks of alcohol per week. He reports current drug use. Drug: Marijuana.  ROS: UROLOGY Frequent Urination?: Yes Hard to postpone urination?: No Burning/pain with urination?: No Get up at night to urinate?: Yes Leakage of urine?: No Urine stream starts and stops?: No Trouble starting stream?: No Do you have to strain to urinate?: No Blood in urine?: No Urinary tract infection?: No Sexually transmitted disease?: No Injury to kidneys or bladder?: No Painful intercourse?: No Weak stream?: No Erection problems?: Yes Penile pain?:  No  Gastrointestinal Nausea?: No Vomiting?: No Indigestion/heartburn?: No Diarrhea?: No Constipation?: Yes  Constitutional Fever: No Night sweats?: No Weight loss?: No Fatigue?: Yes  Skin Skin rash/lesions?: No Itching?: Yes  Eyes Blurred vision?: No Double vision?: No  Ears/Nose/Throat Sore throat?: No Sinus problems?: No  Hematologic/Lymphatic Swollen glands?: No Easy  bruising?: Yes  Cardiovascular Leg swelling?: No Chest pain?: No  Respiratory Cough?: No Shortness of breath?: No  Endocrine Excessive thirst?: No  Musculoskeletal Back pain?: Yes Joint pain?: Yes  Neurological Headaches?: Yes Dizziness?: No  Psychologic Depression?: Yes Anxiety?: Yes  Physical Exam: BP 127/74 (BP Location: Left Arm, Patient Position: Sitting)   Pulse 92   Ht 5\' 10"  (1.778 m)   Wt 237 lb (107.5 kg)   BMI 34.01 kg/m   Constitutional:  Alert and oriented, No acute distress. HEENT: [place] AT, moist mucus membranes.  Trachea midline, no masses. Cardiovascular: No clubbing, cyanosis, or edema. Respiratory: Normal respiratory effort, no increased work of breathing. Neurologic: Grossly intact, no focal deficits, moving all 4 extremities. Psychiatric: Normal mood and affect.  Laboratory Data: Lab Results  Component Value Date   WBC 7.8 04/22/2017   HGB 13.7 04/22/2017   HCT 39.7 (L) 04/22/2017   MCV 89.4 04/22/2017   PLT 403 04/22/2017    Lab Results  Component Value Date   CREATININE 0.97 04/22/2017   PSA as above  Pertinent Imaging: Results for orders placed or performed in visit on 01/10/18  BLADDER SCAN AMB NON-IMAGING  Result Value Ref Range   Scan Result 1106ml    Patient voided just after having a bladder scan done (this is not a true PVR)   Assessment & Plan:    1. BPH with urinary obstruction Obstructive urinary symptoms slightly improved with Flomax but continues to be fairly significantly symptomatic Given his history of urethral stricture,  I do feel it is prudent to pursue cystoscopy to rule out stricture as well as evaluate the prostate Patient is agreeable this plan - BLADDER SCAN AMB NON-IMAGING  2. Elevated PSA Personal history of elevated and fluctuating PSA As suspect there is a inflammatory component to his PSA We did discuss the option of pursuing prostate biopsy  He would like to have the lab repeated again in 1 month to see if it is trending back down again which seems reasonable We will plan for repeat PSA prior to cystoscopy next month - PSA; Future  3. Combined arterial insufficiency and corporo-venous occlusive erectile dysfunction We discussed options for treatment of erectile dysfunction He has had a cardiac work-up which is negative which is reassuring No contraindications for PDE 5 inhibitors, will start with sildenafil to be used as needed Discussed possible side effects as well as optimal administration All questions answered   Return in about 4 weeks (around 02/07/2018) for cysto/ PSA.  Hollice Espy, MD  [facility] [address] [place], [place] [address] [phone]

## 2018-02-16 ENCOUNTER — Other Ambulatory Visit: Payer: BLUE CROSS/BLUE SHIELD

## 2018-02-22 ENCOUNTER — Other Ambulatory Visit: Payer: BLUE CROSS/BLUE SHIELD | Admitting: Urology

## 2018-03-06 ENCOUNTER — Telehealth: Payer: Self-pay | Admitting: Urology

## 2018-03-06 ENCOUNTER — Other Ambulatory Visit: Payer: BLUE CROSS/BLUE SHIELD

## 2018-03-06 DIAGNOSIS — R972 Elevated prostate specific antigen [PSA]: Secondary | ICD-10-CM

## 2018-03-06 NOTE — Telephone Encounter (Signed)
Pt had lab appt today to have PSA checked.  He wanted you to know that he canceled cysto for personal reasons and he made some lifestyle changes (quit drinking lots of beer) and he's not going to the bathroom as much.  He still wants to keep check on his PSA, but doesn't want to proceed with cysto at this time.

## 2018-03-07 LAB — PSA: PROSTATE SPECIFIC AG, SERUM: 5.3 ng/mL — AB (ref 0.0–4.0)

## 2018-03-08 NOTE — Telephone Encounter (Signed)
Patient notified, he states he is going to have knee surgery this month so he would like to wait 4 months and repeat PSA and go from there. Lab apt was made and orders were placed

## 2018-03-08 NOTE — Telephone Encounter (Signed)
-----   Message from Hollice Espy, MD sent at 03/08/2018 10:05 AM EST ----- PSA is still markedly elevated to 5.3 but trending downward.  I would recommend consideration of repeat PSA in 4 months vs. pursuing prostate biopsy vs. Prostate MRI for further evaluation.   We can either discuss this further via clinic appointment if you prefer.  Otherwise, let us know what you would like to do and we can help arrange.    Hollice Espy, MD

## 2018-03-08 NOTE — Telephone Encounter (Signed)
Left pt mess to call 

## 2018-03-28 ENCOUNTER — Other Ambulatory Visit (HOSPITAL_COMMUNITY): Payer: Self-pay

## 2018-03-28 ENCOUNTER — Encounter (HOSPITAL_COMMUNITY): Payer: Self-pay

## 2018-03-28 ENCOUNTER — Ambulatory Visit: Payer: Self-pay | Admitting: Physician Assistant

## 2018-03-28 NOTE — H&P (View-Only) (Signed)
TOTAL KNEE ADMISSION H&P  Patient is being admitted for right total knee arthroplasty.  Subjective:  Chief Complaint:right knee pain.  HPI: Randall Flores, 63 y.o. male, has a history of pain and functional disability in the right knee due to arthritis and has failed non-surgical conservative treatments for greater than 12 weeks to includeuse of assistive devices and activity modification.  Onset of symptoms was gradual, starting 8 years ago with gradually worsening course since that time. The patient noted no past surgery on the right knee(s).  Patient currently rates pain in the right knee(s) at 8 out of 10 with activity. Patient has night pain, worsening of pain with activity and weight bearing, pain that interferes with activities of daily living, pain with passive range of motion, crepitus and joint swelling.  Patient has evidence of periarticular osteophytes and joint space narrowing by imaging studies.There is no active infection.  Patient Active Problem List   Diagnosis Date Noted  . Colon cancer screening   . 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 Abdominal pain, right upper quadrant 02/06/2015  . Right lower quadrant abdominal pain 02/06/2015   Past Medical History:  Diagnosis Date  . Anxiety   . BPH with obstruction/lower urinary tract symptoms   . Colon polyp   . Complication of anesthesia    wakes up during anesthesia  . DDD (degenerative disc disease), lumbar   . Diverticulitis   . Diverticulosis   . Elevated PSA   . Fatty liver 01/22/2015   Mild, noted on [place] Abd  . Heart murmur   . 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 Hepatic hemangioma 02/03/2015   Small, noted on MRI Abd  . Hypercholesteremia   . Hypertension   . Hypothyroidism   . Major depression   . Moderate aortic stenosis 05/24/2017   Noted on ECHO  . Near syncope 04/25/2017  . OA (osteoarthritis)    knees, back, hands  . PVC (premature ventricular contraction)   .  Stroke [facility]) 2015   TIA/right side affected  . Thyroid disease   . Wears dentures    partial upper, front    Past Surgical History:  Procedure Laterality Date  . [place], 2001   [place] L5-S1  . COLONOSCOPY     [place]  . COLONOSCOPY WITH PROPOFOL N/A 01/21/2017   Procedure: COLONOSCOPY WITH PROPOFOL;  Surgeon: [name], MD;  Location: [place];  Service: Endoscopy;  Laterality: N/A;  . POLYPECTOMY  01/21/2017   Procedure: POLYPECTOMY;  Surgeon: [name], MD;  Location: [place];  Service: Endoscopy;;    Current Outpatient Medications  Medication Sig Dispense Refill Last Dose  . acetaminophen (TYLENOL) 500 MG tablet Take 1,000 mg by mouth every 8 (eight) hours as needed (pain).     . BEE POLLEN PO Take 500 mg by mouth daily.    Taking  . citalopram (CELEXA) 20 MG tablet Take 20 mg by mouth daily.   Taking  . clopidogrel (PLAVIX) 75 MG tablet Take 75 mg by mouth daily.    Taking  . DULoxetine (CYMBALTA) 60 MG capsule Take 60 mg by mouth daily.   Taking  . Glucosamine-Chondroit-Vit C-Mn (GLUCOSAMINE CHONDR 500 COMPLEX PO) Take 1 tablet by mouth daily.     . hydrocortisone cream 0.5 % Apply 1 application topically daily as needed for itching.     . levothyroxine (SYNTHROID, LEVOTHROID) 150 MCG tablet Take 150 mcg by mouth daily  before breakfast.    Taking  . Liniments (SALONPAS PAIN RELIEF PATCH EX) Apply 1 patch topically daily as needed (pain).     Marland Kitchen lisinopril (PRINIVIL,ZESTRIL) 10 MG tablet Take 10 mg by mouth daily.    Taking  . Multiple Vitamin (MULTIVITAMIN) capsule Take 1 capsule by mouth daily.   Taking  . rosuvastatin (CRESTOR) 10 MG tablet Take 10 mg by mouth daily.     . sildenafil (REVATIO) 20 MG tablet Take 1 tablet (20 mg total) by mouth as needed. Take 1-5 tabs as needed prior to intercourse 30 tablet 11   . tamsulosin (FLOMAX) 0.4 MG CAPS capsule Take 1 capsule (0.4 mg total) by mouth daily. 90 capsule 3 Taking  . vitamin C  (ASCORBIC ACID) 500 MG tablet Take 500 mg by mouth daily.      No current facility-administered medications for this visit.    Allergies  Allergen Reactions  . Cod [facility] Allergy]   . Codeine Nausea And Vomiting  . Ivp Dye [Iodinated Diagnostic Agents] Nausea And Vomiting    Also felt very hot/flushed  . Pravastatin     Other reaction(s): Muscle Pain  . Shellfish Allergy Nausea And Vomiting    Social History   Tobacco Use  . Smoking status: Never Smoker  . Smokeless tobacco: Never Used  Substance Use Topics  . Alcohol use: Yes    Alcohol/week: 14.0 standard drinks    Types: 14 Cans of beer per week    Comment:      Family History  Problem Relation Age of Onset  . Alzheimer's disease Mother      Review of Systems  Genitourinary: Positive for frequency.  Musculoskeletal: Positive for joint pain.  All other systems reviewed and are negative.   Objective:  Physical Exam  Constitutional: He is oriented to person, place, and time. He appears well-developed and well-nourished. No distress.  HENT:  Head: Normocephalic and atraumatic.  Eyes: Pupils are equal, round, and reactive to light. Conjunctivae and EOM are normal.  Neck: Normal range of motion. Neck supple.  Cardiovascular: Normal rate, regular rhythm and intact distal pulses.  Murmur heard. Respiratory: Effort normal and breath sounds normal. No respiratory distress. He has no wheezes.  GI: Soft. Bowel sounds are normal. He exhibits no distension. There is no abdominal tenderness.  Musculoskeletal:     Right knee: He exhibits decreased range of motion and swelling. He exhibits no effusion. Tenderness found. Medial joint line and lateral joint line tenderness noted.  Lymphadenopathy:    He has no cervical adenopathy.  Neurological: He is alert and oriented to person, place, and time.  Skin: Skin is warm and dry. No rash noted. No erythema.  Psychiatric: He has a normal mood and affect. His behavior is normal.     Vital signs in last 24 hours: @VSRANGES @  Labs:   Estimated body mass index is 34.01 kg/m as calculated from the following:   Height as of 01/10/18: 5\' 10"  (1.778 m).   Weight as of 01/10/18: 107.5 kg.   Imaging Review Plain radiographs demonstrate severe degenerative joint disease of the right knee(s). The overall alignment issignificant varus. The bone quality appears to be good for age and reported activity level.      Assessment/Plan:  End stage arthritis, right knee   The patient history, physical examination, clinical judgment of the provider and imaging studies are consistent with end stage degenerative joint disease of the right knee(s) and total knee arthroplasty is deemed medically necessary.  The treatment options including medical management, injection therapy arthroscopy and arthroplasty were discussed at length. The risks and benefits of total knee arthroplasty were presented and reviewed. The risks due to aseptic loosening, infection, stiffness, patella tracking problems, thromboembolic complications and other imponderables were discussed. The patient acknowledged the explanation, agreed to proceed with the plan and consent was signed. Patient is being admitted for inpatient treatment for surgery, pain control, PT, OT, prophylactic antibiotics, VTE prophylaxis, progressive ambulation and ADL's and discharge planning. The patient is planning to be discharged home with home health services    Anticipated LOS equal to or greater than 2 midnights due to - Age 72 and older with one or more of the following:  - Obesity  - Expected need for hospital services (PT, OT, Nursing) required for safe  discharge  - Anticipated need for postoperative skilled nursing care or inpatient rehab  - Active co-morbidities: Stroke OR   - Unanticipated findings during/Post Surgery: None  - Patient is a high risk of re-admission due to: None

## 2018-03-28 NOTE — Patient Instructions (Addendum)
Your procedure is scheduled on: Friday, April 07, 2018   Surgery Time:  7:30AM-9:30AM   Report to [place]  Entrance    Report to admitting at 5:30 AM   Call this number if you have problems the morning of surgery [phone]   Do not eat food or drink liquids :After Midnight.   Brush your teeth the morning of surgery.   Do NOT smoke after Midnight   Take these medicines the morning of surgery with A SIP OF WATER: Citalopram, Duloxetine, Levothyroxine, Rosuvastatin, Tamsulosin  DO NOT TAKE ANY SILDENAFIL 24 HOURS PRIOR TO SURGERY                               You may not have any metal on your body including jewelry, and body piercings             Do not wear lotions, powders, perfumes/cologne, or deodorant                          Men may shave face and neck.   Do not bring valuables to the hospital. [place].   Contacts, dentures or bridgework may not be worn into surgery.   Leave suitcase in the car. After surgery it may be brought to your room.    Special Instructions: Bring a copy of your healthcare power of attorney and living will documents         the day of surgery if you haven't scanned them in before.              Please read over the following fact sheets you were given:  [facility] - Preparing for Surgery Before surgery, you can play an important role.  Because skin is not sterile, your skin needs to be as free of germs as possible.  You can reduce the number of germs on your skin by washing with CHG (chlorahexidine gluconate) soap before surgery.  CHG is an antiseptic cleaner which kills germs and bonds with the skin to continue killing germs even after washing. Please DO NOT use if you have an allergy to CHG or antibacterial soaps.  If your skin becomes reddened/irritated stop using the CHG and inform your nurse when you arrive at Short Stay. Do not shave (including legs and underarms) for  at least 48 hours prior to the first CHG shower.  You may shave your face/neck.  Please follow these instructions carefully:  1.  Shower with CHG Soap the night before surgery and the  morning of surgery.  2.  If you choose to wash your hair, wash your hair first as usual with your normal  shampoo.  3.  After you shampoo, rinse your hair and body thoroughly to remove the shampoo.                             4.  Use CHG as you would any other liquid soap.  You can apply chg directly to the skin and wash.  Gently with a scrungie or clean washcloth.  5.  Apply the CHG Soap to your body ONLY FROM THE NECK DOWN.   Do   not use on face/ open  Wound or open sores. Avoid contact with eyes, ears mouth and   genitals (private parts).                       Wash face,  Genitals (private parts) with your normal soap.             6.  Wash thoroughly, paying special attention to the area where your    surgery  will be performed.  7.  Thoroughly rinse your body with warm water from the neck down.  8.  DO NOT shower/wash with your normal soap after using and rinsing off the CHG Soap.                9.  Pat yourself dry with a clean towel.            10.  Wear clean pajamas.            11.  Place clean sheets on your bed the night of your first shower and do not  sleep with pets. Day of Surgery : Do not apply any lotions/deodorants the morning of surgery.  Please wear clean clothes to the hospital/surgery center.  FAILURE TO FOLLOW THESE INSTRUCTIONS MAY RESULT IN THE CANCELLATION OF YOUR SURGERY  PATIENT SIGNATURE_________________________________  NURSE SIGNATURE__________________________________  ________________________________________________________________________  WHAT IS A BLOOD TRANSFUSION? Blood Transfusion Information  A transfusion is the replacement of blood or some of its parts. Blood is made up of multiple cells which provide different functions.  Red blood cells  carry oxygen and are used for blood loss replacement.  White blood cells fight against infection.  Platelets control bleeding.  Plasma helps clot blood.  Other blood products are available for specialized needs, such as hemophilia or other clotting disorders. BEFORE THE TRANSFUSION  Who gives blood for transfusions?   Healthy volunteers who are fully evaluated to make sure their blood is safe. This is blood bank blood. Transfusion therapy is the safest it has ever been in the practice of medicine. Before blood is taken from a donor, a complete history is taken to make sure that person has no history of diseases nor engages in risky social behavior (examples are intravenous drug use or sexual activity with multiple partners). The donor's travel history is screened to minimize risk of transmitting infections, such as malaria. The donated blood is tested for signs of infectious diseases, such as HIV and hepatitis. The blood is then tested to be sure it is compatible with you in order to minimize the chance of a transfusion reaction. If you or a relative donates blood, this is often done in anticipation of surgery and is not appropriate for emergency situations. It takes many days to process the donated blood. RISKS AND COMPLICATIONS Although transfusion therapy is very safe and saves many lives, the main dangers of transfusion include:   Getting an infectious disease.  Developing a transfusion reaction. This is an allergic reaction to something in the blood you were given. Every precaution is taken to prevent this. The decision to have a blood transfusion has been considered carefully by your caregiver before blood is given. Blood is not given unless the benefits outweigh the risks. AFTER THE TRANSFUSION  Right after receiving a blood transfusion, you will usually feel much better and more energetic. This is especially true if your red blood cells have gotten low (anemic). The transfusion raises  the level of the red blood cells which carry oxygen, and this usually  causes an energy increase.  The nurse administering the transfusion will monitor you carefully for complications. HOME CARE INSTRUCTIONS  No special instructions are needed after a transfusion. You may find your energy is better. Speak with your caregiver about any limitations on activity for underlying diseases you may have. SEEK MEDICAL CARE IF:   Your condition is not improving after your transfusion.  You develop redness or irritation at the intravenous (IV) site. SEEK IMMEDIATE MEDICAL CARE IF:  Any of the following symptoms occur over the next 12 hours:  Shaking chills.  You have a temperature by mouth above 102 F (38.9 C), not controlled by medicine.  Chest, back, or muscle pain.  People around you feel you are not acting correctly or are confused.  Shortness of breath or difficulty breathing.  Dizziness and fainting.  You get a rash or develop hives.  You have a decrease in urine output.  Your urine turns a dark color or changes to pink, red, or brown. Any of the following symptoms occur over the next 10 days:  You have a temperature by mouth above 102 F (38.9 C), not controlled by medicine.  Shortness of breath.  Weakness after normal activity.  The white part of the eye turns yellow (jaundice).  You have a decrease in the amount of urine or are urinating less often.  Your urine turns a dark color or changes to pink, red, or brown. Document Released: 01/09/2000 Document Revised: 04/05/2011 Document Reviewed: 08/28/2007 ExitCare Patient Information 2014 [place].  _______________________________________________________________________   Incentive Spirometer  An incentive spirometer is a tool that can help keep your lungs clear and active. This tool measures how well you are filling your lungs with each breath. Taking long deep breaths may help reverse or decrease the chance of  developing breathing (pulmonary) problems (especially infection) following:  A long period of time when you are unable to move or be active. BEFORE THE PROCEDURE   If the spirometer includes an indicator to show your best effort, your nurse or respiratory therapist will set it to a desired goal.  If possible, sit up straight or lean slightly forward. Try not to slouch.  Hold the incentive spirometer in an upright position. INSTRUCTIONS FOR USE  1. Sit on the edge of your bed if possible, or sit up as far as you can in bed or on a chair. 2. Hold the incentive spirometer in an upright position. 3. Breathe out normally. 4. Place the mouthpiece in your mouth and seal your lips tightly around it. 5. Breathe in slowly and as deeply as possible, raising the piston or the ball toward the top of the column. 6. Hold your breath for 3-5 seconds or for as long as possible. Allow the piston or ball to fall to the bottom of the column. 7. Remove the mouthpiece from your mouth and breathe out normally. 8. Rest for a few seconds and repeat Steps 1 through 7 at least 10 times every 1-2 hours when you are awake. Take your time and take a few normal breaths between deep breaths. 9. The spirometer may include an indicator to show your best effort. Use the indicator as a goal to work toward during each repetition. 10. After each set of 10 deep breaths, practice coughing to be sure your lungs are clear. If you have an incision (the cut made at the time of surgery), support your incision when coughing by placing a pillow or rolled up towels firmly against it.  Once you are able to get out of bed, walk around indoors and cough well. You may stop using the incentive spirometer when instructed by your caregiver.  RISKS AND COMPLICATIONS  Take your time so you do not get dizzy or light-headed.  If you are in pain, you may need to take or ask for pain medication before doing incentive spirometry. It is harder to take a  deep breath if you are having pain. AFTER USE  Rest and breathe slowly and easily.  It can be helpful to keep track of a log of your progress. Your caregiver can provide you with a simple table to help with this. If you are using the spirometer at home, follow these instructions: [place] IF:   You are having difficultly using the spirometer.  You have trouble using the spirometer as often as instructed.  Your pain medication is not giving enough relief while using the spirometer.  You develop fever of 100.5 F (38.1 C) or higher. SEEK IMMEDIATE MEDICAL CARE IF:   You cough up bloody sputum that had not been present before.  You develop fever of 102 F (38.9 C) or greater.  You develop worsening pain at or near the incision site. MAKE SURE YOU:   Understand these instructions.  Will watch your condition.  Will get help right away if you are not doing well or get worse. Document Released: 05/24/2006 Document Revised: 04/05/2011 Document Reviewed: 07/25/2006 [facility] Patient Information 2014 [place], [place].   ________________________________________________________________________

## 2018-03-28 NOTE — Pre-Procedure Instructions (Signed)
The following are in epic: EKG 04/22/2017 CXR 04/22/2017  ECHO 05/24/2017 in care everywhere

## 2018-03-28 NOTE — H&P (Signed)
TOTAL KNEE ADMISSION H&P  Patient is being admitted for right total knee arthroplasty.  Subjective:  Chief Complaint:right knee pain.  HPI: Randall Flores, 63 y.o. male, has a history of pain and functional disability in the right knee due to arthritis and has failed non-surgical conservative treatments for greater than 12 weeks to includeuse of assistive devices and activity modification.  Onset of symptoms was gradual, starting 8 years ago with gradually worsening course since that time. The patient noted no past surgery on the right knee(s).  Patient currently rates pain in the right knee(s) at 8 out of 10 with activity. Patient has night pain, worsening of pain with activity and weight bearing, pain that interferes with activities of daily living, pain with passive range of motion, crepitus and joint swelling.  Patient has evidence of periarticular osteophytes and joint space narrowing by imaging studies.There is no active infection.  Patient Active Problem List   Diagnosis Date Noted  . Colon cancer screening   . Pseudopolyposis of colon without complication ([place])   . Abdominal pain, right upper quadrant 02/06/2015  . Right lower quadrant abdominal pain 02/06/2015   Past Medical History:  Diagnosis Date  . Anxiety   . BPH with obstruction/lower urinary tract symptoms   . Colon polyp   . Complication of anesthesia    wakes up during anesthesia  . DDD (degenerative disc disease), lumbar   . Diverticulitis   . Diverticulosis   . Elevated PSA   . Fatty liver 01/22/2015   Mild, noted on [place] Abd  . Heart murmur   . 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 Hepatic hemangioma 02/03/2015   Small, noted on MRI Abd  . Hypercholesteremia   . Hypertension   . Hypothyroidism   . Major depression   . Moderate aortic stenosis 05/24/2017   Noted on ECHO  . Near syncope 04/25/2017  . OA (osteoarthritis)    knees, back, hands  . PVC (premature ventricular contraction)   .  Stroke [facility]) 2015   TIA/right side affected  . Thyroid disease   . Wears dentures    partial upper, front    Past Surgical History:  Procedure Laterality Date  . [place], 2001   [place] L5-S1  . COLONOSCOPY     [place]  . COLONOSCOPY WITH PROPOFOL N/A 01/21/2017   Procedure: COLONOSCOPY WITH PROPOFOL;  Surgeon: [name], MD;  Location: [place];  Service: Endoscopy;  Laterality: N/A;  . POLYPECTOMY  01/21/2017   Procedure: POLYPECTOMY;  Surgeon: [name], MD;  Location: [place];  Service: Endoscopy;;    Current Outpatient Medications  Medication Sig Dispense Refill Last Dose  . acetaminophen (TYLENOL) 500 MG tablet Take 1,000 mg by mouth every 8 (eight) hours as needed (pain).     . BEE POLLEN PO Take 500 mg by mouth daily.    Taking  . citalopram (CELEXA) 20 MG tablet Take 20 mg by mouth daily.   Taking  . clopidogrel (PLAVIX) 75 MG tablet Take 75 mg by mouth daily.    Taking  . DULoxetine (CYMBALTA) 60 MG capsule Take 60 mg by mouth daily.   Taking  . Glucosamine-Chondroit-Vit C-Mn (GLUCOSAMINE CHONDR 500 COMPLEX PO) Take 1 tablet by mouth daily.     . hydrocortisone cream 0.5 % Apply 1 application topically daily as needed for itching.     . levothyroxine (SYNTHROID, LEVOTHROID) 150 MCG tablet Take 150 mcg by mouth daily  before breakfast.    Taking  . Liniments (SALONPAS PAIN RELIEF PATCH EX) Apply 1 patch topically daily as needed (pain).     Marland Kitchen lisinopril (PRINIVIL,ZESTRIL) 10 MG tablet Take 10 mg by mouth daily.    Taking  . Multiple Vitamin (MULTIVITAMIN) capsule Take 1 capsule by mouth daily.   Taking  . rosuvastatin (CRESTOR) 10 MG tablet Take 10 mg by mouth daily.     . sildenafil (REVATIO) 20 MG tablet Take 1 tablet (20 mg total) by mouth as needed. Take 1-5 tabs as needed prior to intercourse 30 tablet 11   . tamsulosin (FLOMAX) 0.4 MG CAPS capsule Take 1 capsule (0.4 mg total) by mouth daily. 90 capsule 3 Taking  . vitamin C  (ASCORBIC ACID) 500 MG tablet Take 500 mg by mouth daily.      No current facility-administered medications for this visit.    Allergies  Allergen Reactions  . Cod [facility] Allergy]   . Codeine Nausea And Vomiting  . Ivp Dye [Iodinated Diagnostic Agents] Nausea And Vomiting    Also felt very hot/flushed  . Pravastatin     Other reaction(s): Muscle Pain  . Shellfish Allergy Nausea And Vomiting    Social History   Tobacco Use  . Smoking status: Never Smoker  . Smokeless tobacco: Never Used  Substance Use Topics  . Alcohol use: Yes    Alcohol/week: 14.0 standard drinks    Types: 14 Cans of beer per week    Comment:      Family History  Problem Relation Age of Onset  . Alzheimer's disease Mother      Review of Systems  Genitourinary: Positive for frequency.  Musculoskeletal: Positive for joint pain.  All other systems reviewed and are negative.   Objective:  Physical Exam  Constitutional: He is oriented to person, place, and time. He appears well-developed and well-nourished. No distress.  HENT:  Head: Normocephalic and atraumatic.  Eyes: Pupils are equal, round, and reactive to light. Conjunctivae and EOM are normal.  Neck: Normal range of motion. Neck supple.  Cardiovascular: Normal rate, regular rhythm and intact distal pulses.  Murmur heard. Respiratory: Effort normal and breath sounds normal. No respiratory distress. He has no wheezes.  GI: Soft. Bowel sounds are normal. He exhibits no distension. There is no abdominal tenderness.  Musculoskeletal:     Right knee: He exhibits decreased range of motion and swelling. He exhibits no effusion. Tenderness found. Medial joint line and lateral joint line tenderness noted.  Lymphadenopathy:    He has no cervical adenopathy.  Neurological: He is alert and oriented to person, place, and time.  Skin: Skin is warm and dry. No rash noted. No erythema.  Psychiatric: He has a normal mood and affect. His behavior is normal.     Vital signs in last 24 hours: @VSRANGES @  Labs:   Estimated body mass index is 34.01 kg/m as calculated from the following:   Height as of 01/10/18: 5\' 10"  (1.778 m).   Weight as of 01/10/18: 107.5 kg.   Imaging Review Plain radiographs demonstrate severe degenerative joint disease of the right knee(s). The overall alignment issignificant varus. The bone quality appears to be good for age and reported activity level.      Assessment/Plan:  End stage arthritis, right knee   The patient history, physical examination, clinical judgment of the provider and imaging studies are consistent with end stage degenerative joint disease of the right knee(s) and total knee arthroplasty is deemed medically necessary.  The treatment options including medical management, injection therapy arthroscopy and arthroplasty were discussed at length. The risks and benefits of total knee arthroplasty were presented and reviewed. The risks due to aseptic loosening, infection, stiffness, patella tracking problems, thromboembolic complications and other imponderables were discussed. The patient acknowledged the explanation, agreed to proceed with the plan and consent was signed. Patient is being admitted for inpatient treatment for surgery, pain control, PT, OT, prophylactic antibiotics, VTE prophylaxis, progressive ambulation and ADL's and discharge planning. The patient is planning to be discharged home with home health services    Anticipated LOS equal to or greater than 2 midnights due to - Age 29 and older with one or more of the following:  - Obesity  - Expected need for hospital services (PT, OT, Nursing) required for safe  discharge  - Anticipated need for postoperative skilled nursing care or inpatient rehab  - Active co-morbidities: Stroke OR   - Unanticipated findings during/Post Surgery: None  - Patient is a high risk of re-admission due to: None

## 2018-03-29 ENCOUNTER — Other Ambulatory Visit: Payer: Self-pay

## 2018-03-29 ENCOUNTER — Encounter (HOSPITAL_COMMUNITY)
Admission: RE | Admit: 2018-03-29 | Discharge: 2018-03-29 | Disposition: A | Discharge status: Home / Self Care | Payer: 59 | Source: Ambulatory Visit | Attending: Orthopedic Surgery | Admitting: Orthopedic Surgery

## 2018-03-29 ENCOUNTER — Encounter (HOSPITAL_COMMUNITY): Payer: Self-pay

## 2018-03-29 DIAGNOSIS — M1711 Unilateral primary osteoarthritis, right knee: Secondary | ICD-10-CM | POA: Insufficient documentation

## 2018-03-29 DIAGNOSIS — Z01812 Encounter for preprocedural laboratory examination: Secondary | ICD-10-CM | POA: Diagnosis not present

## 2018-03-29 HISTORY — DX: Other obstructive and reflux uropathy: N40.1

## 2018-03-29 HISTORY — DX: Ventricular premature depolarization: I49.3

## 2018-03-29 HISTORY — DX: Diverticulosis of intestine, part unspecified, without perforation or abscess without bleeding: K57.90

## 2018-03-29 HISTORY — DX: Unspecified osteoarthritis, unspecified site: M19.90

## 2018-03-29 HISTORY — DX: Benign prostatic hyperplasia with lower urinary tract symptoms: N13.8

## 2018-03-29 HISTORY — DX: Pure hypercholesterolemia, unspecified: E78.00

## 2018-03-29 HISTORY — DX: Major depressive disorder, single episode, unspecified: F32.9

## 2018-03-29 HISTORY — DX: Other intervertebral disc degeneration, lumbar region without mention of lumbar back pain or lower extremity pain: M51.369

## 2018-03-29 HISTORY — DX: Fatty (change of) liver, not elsewhere classified: K76.0

## 2018-03-29 HISTORY — DX: Personal history of other diseases of the digestive system: Z87.19

## 2018-03-29 HISTORY — DX: Personal history of urinary calculi: Z87.442

## 2018-03-29 HISTORY — DX: Prediabetes: R73.03

## 2018-03-29 HISTORY — DX: Hypothyroidism, unspecified: E03.9

## 2018-03-29 HISTORY — DX: Hemangioma of intra-abdominal structures: D18.03

## 2018-03-29 HISTORY — DX: Syncope and collapse: R55

## 2018-03-29 HISTORY — DX: Other intervertebral disc degeneration, lumbar region: M51.36

## 2018-03-29 HISTORY — DX: Nonrheumatic aortic (valve) stenosis: I35.0

## 2018-03-29 HISTORY — DX: Other specified diseases of liver: K76.89

## 2018-03-29 HISTORY — DX: Elevated prostate specific antigen (PSA): R97.20

## 2018-03-29 LAB — URINALYSIS, ROUTINE W REFLEX MICROSCOPIC
Bilirubin Urine: NEGATIVE
GLUCOSE, UA: NEGATIVE mg/dL
HGB URINE DIPSTICK: NEGATIVE
Ketones, ur: NEGATIVE mg/dL
Leukocytes,Ua: NEGATIVE
Nitrite: NEGATIVE
Protein, ur: NEGATIVE mg/dL
Specific Gravity, Urine: 1.014 (ref 1.005–1.030)
pH: 7 (ref 5.0–8.0)

## 2018-03-29 LAB — CBC WITH DIFFERENTIAL/PLATELET
ABS IMMATURE GRANULOCYTES: 0.04 10*3/uL (ref 0.00–0.07)
Basophils Absolute: 0 10*3/uL (ref 0.0–0.1)
Basophils Relative: 1 %
Eosinophils Absolute: 0.2 10*3/uL (ref 0.0–0.5)
Eosinophils Relative: 3 %
HCT: 40.9 % (ref 39.0–52.0)
Hemoglobin: 13 g/dL (ref 13.0–17.0)
Immature Granulocytes: 1 %
LYMPHS ABS: 1.3 10*3/uL (ref 0.7–4.0)
LYMPHS PCT: 21 %
MCH: 29.3 pg (ref 26.0–34.0)
MCHC: 31.8 g/dL (ref 30.0–36.0)
MCV: 92.1 fL (ref 80.0–100.0)
Monocytes Absolute: 0.7 10*3/uL (ref 0.1–1.0)
Monocytes Relative: 11 %
Neutro Abs: 4.1 10*3/uL (ref 1.7–7.7)
Neutrophils Relative %: 63 %
Platelets: 262 10*3/uL (ref 150–400)
RBC: 4.44 MIL/uL (ref 4.22–5.81)
RDW: 12.6 % (ref 11.5–15.5)
WBC: 6.4 10*3/uL (ref 4.0–10.5)
nRBC: 0 % (ref 0.0–0.2)

## 2018-03-29 LAB — PROTIME-INR
INR: 0.9 (ref 0.8–1.2)
Prothrombin Time: 12.3 seconds (ref 11.4–15.2)

## 2018-03-29 LAB — COMPREHENSIVE METABOLIC PANEL
ALT: 24 U/L (ref 0–44)
ANION GAP: 5 (ref 5–15)
AST: 19 U/L (ref 15–41)
Albumin: 4.7 g/dL (ref 3.5–5.0)
Alkaline Phosphatase: 58 U/L (ref 38–126)
BUN: 18 mg/dL (ref 8–23)
CO2: 29 mmol/L (ref 22–32)
Calcium: 9.2 mg/dL (ref 8.9–10.3)
Chloride: 105 mmol/L (ref 98–111)
Creatinine, Ser: 1.11 mg/dL (ref 0.61–1.24)
GFR calc Af Amer: >60 mL/min (ref >60)
GFR calc non Af Amer: >60 mL/min (ref >60)
Glucose, Bld: 127 mg/dL — ABNORMAL HIGH (ref 70–99)
POTASSIUM: 4.6 mmol/L (ref 3.5–5.1)
Sodium: 139 mmol/L (ref 135–145)
Total Bilirubin: 0.7 mg/dL (ref 0.3–1.2)
Total Protein: 7.3 g/dL (ref 6.5–8.1)

## 2018-03-29 LAB — APTT: aPTT: 27 seconds (ref 24–36)

## 2018-03-29 LAB — SURGICAL PCR SCREEN
MRSA, PCR: NEGATIVE
Staphylococcus aureus: NEGATIVE

## 2018-03-29 LAB — ABO/RH: ABO/RH(D): A POS

## 2018-03-29 NOTE — Pre-Procedure Instructions (Signed)
CMP results 03/29/2018 sent to Dr. French Ana via epic.  Chart sent to [facility].A. for review.

## 2018-03-29 NOTE — Pre-Procedure Instructions (Signed)
Hgb A1C (6.1 on 03/16/2018 in hard chart.  Last dose of Plavix will be 04/03/2018 per Dr. French Ana per Mr. Macmillan

## 2018-03-30 LAB — URINE CULTURE: Culture: NO GROWTH

## 2018-03-31 NOTE — Pre-Procedure Instructions (Signed)
Surgicial clearance and Last office visit note, Hgb A1C (6.1) 03/22/2018 Dr. Sherril Cong in hard chart

## 2018-04-04 NOTE — Anesthesia Preprocedure Evaluation (Addendum)
Anesthesia Evaluation  Patient identified by MRN, date of birth, ID band Patient awake    Reviewed: Allergy & Precautions, H&P , NPO status , Patient's Chart, lab work & pertinent test results  Airway Mallampati: II   Neck ROM: full    Dental   Pulmonary neg pulmonary ROS,    breath sounds clear to auscultation       Cardiovascular hypertension, + Valvular Problems/Murmurs AS  Rhythm:regular Rate:Normal  Moderate AS. Pt cleared by his PCP from medical and cardiac standpoint.  Pt last took his plavix on Friday 3/6.   Neuro/Psych    GI/Hepatic   Endo/Other  Hypothyroidism   Renal/GU      Musculoskeletal  (+) Arthritis ,   Abdominal   Peds  Hematology   Anesthesia Other Findings   Reproductive/Obstetrics                           Anesthesia Physical Anesthesia Plan  ASA: III  Anesthesia Plan: General   Post-op Pain Management:  Regional for Post-op pain   Induction: Intravenous  PONV Risk Score and Plan: 2 and Ondansetron, Dexamethasone, Midazolam and Treatment may vary due to age or medical condition  Airway Management Planned: LMA  Additional Equipment:   Intra-op Plan:   Post-operative Plan:   Informed Consent: I have reviewed the patients History and Physical, chart, labs and discussed the procedure including the risks, benefits and alternatives for the proposed anesthesia with the patient or authorized representative who has indicated his/her understanding and acceptance.       Plan Discussed with: CRNA, Anesthesiologist and Surgeon  Anesthesia Plan Comments: (See PAT note 03/29/18, Konrad Felix, PA-C)       Anesthesia Quick Evaluation

## 2018-04-04 NOTE — Progress Notes (Signed)
Anesthesia Chart Review   Case:  [id number] Date/Time:  04/07/18 0715   Procedure:  TOTAL KNEE ARTHROPLASTY (Right )   Anesthesia type:  General   Pre-op diagnosis:  OA RIGHT KNEE   Location:  [facility]   Surgeon:  Earlie Server, MD      DISCUSSION: 63 yo never smoker with h/o HTN, anxiety, depression, BPH, Stroke (2015, some right sided residual), PVCs, hypothyroidism, reports waking up during previous anesthesia, right knee OA scheduled for above procedure 04/07/18 with Dr. Earlie Server.   Clearance on chart from PCP, Dr. Beverlyn Roux, dated 03/22/18 (on chart) which states pt is optimized for surgery from a medical and cardiac standpoint.  Ok to stop Plavix 3 days prior to surgery. Pt reports last dose of Plavix 04/03/18.   Pt can proceed with planned procedure barring acute status change.  VS: BP (!) 143/71   Pulse 77   Temp 36.7 C (Oral)   Resp 16   Ht 5\' 10"  (1.778 m)   Wt 108.5 kg   SpO2 99%   BMI 34.31 kg/m   PROVIDERS: [name], MD is PCP    LABS: Labs reviewed: Acceptable for surgery. (all labs ordered are listed, but only abnormal results are displayed)  Labs Reviewed  COMPREHENSIVE METABOLIC PANEL - Abnormal; Notable for the following components:      Result Value   Glucose, Bld 127 (*)    All other components within normal limits  URINE CULTURE  SURGICAL PCR SCREEN  APTT  CBC WITH DIFFERENTIAL/PLATELET  PROTIME-INR  URINALYSIS, ROUTINE W REFLEX MICROSCOPIC  TYPE AND SCREEN  ABO/RH     IMAGES: Carotid Doppler 06/27/13 IMPRESSION:  No carotid artery stenosis. No significant plaque within the carotid  arteries.   EKG: 04/22/17 Rate 79 bpm Sinus rhythm   CV: Echo 06/28/13 Summary:   1. No source of TIA or CVA noted.   2. Left ventricular ejection fraction, by visual estimation, is 55 to  60%.   3. Normal global left ventricular systolic function.   4. Impaired relaxation pattern of LV diastolic filling.   5. Mild left  ventricular hypertrophy.   6. Normal right ventricular size and systolic function.   7. Normal RVSP  Past Medical History:  Diagnosis Date  . Anxiety   . BPH with obstruction/lower urinary tract symptoms   . Colon polyp    Multiple  . Complication of anesthesia    wakes up during anesthesia  . DDD (degenerative disc disease), lumbar   . Diverticulitis   . Diverticulosis   . Elevated PSA   . Fatty liver 01/22/2015   Mild, noted on [place] Abd  . Heart murmur   . 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 Hepatic hemangioma 02/03/2015   Small, noted on MRI Abd  . History of colitis   . History of kidney stones   . Hypercholesteremia   . Hypertension   . Hypothyroidism   . Major depression   . Moderate aortic stenosis 05/24/2017   Noted on ECHO  . Near syncope 04/25/2017   due to stress  . OA (osteoarthritis)    knees, back, hands  . Pre-diabetes   . PVC (premature ventricular contraction)   . Stroke [facility]) 2015   TIA/right side affected, right side has improved  . Wears dentures    partial upper, front    Past Surgical History:  Procedure Laterality Date  . [place],  2001   [place] L5-S1  . COLONOSCOPY     [place]  . COLONOSCOPY WITH PROPOFOL N/A 01/21/2017   Procedure: COLONOSCOPY WITH PROPOFOL;  Surgeon: [name], MD;  Location: [place];  Service: Endoscopy;  Laterality: N/A;  . POLYPECTOMY  01/21/2017   Procedure: POLYPECTOMY;  Surgeon: [name], MD;  Location: [place];  Service: Endoscopy;;    MEDICATIONS: . acetaminophen (TYLENOL) 500 MG tablet  . BEE POLLEN PO  . citalopram (CELEXA) 20 MG tablet  . clopidogrel (PLAVIX) 75 MG tablet  . DULoxetine (CYMBALTA) 60 MG capsule  . Glucosamine-Chondroit-Vit C-Mn (GLUCOSAMINE CHONDR 500 COMPLEX PO)  . hydrocortisone cream 0.5 %  . levothyroxine (SYNTHROID, LEVOTHROID) 150 MCG tablet  . Liniments (SALONPAS PAIN RELIEF PATCH EX)  . lisinopril (PRINIVIL,ZESTRIL)  10 MG tablet  . Multiple Vitamin (MULTIVITAMIN) capsule  . rosuvastatin (CRESTOR) 10 MG tablet  . sildenafil (REVATIO) 20 MG tablet  . tamsulosin (FLOMAX) 0.4 MG CAPS capsule  . vitamin C (ASCORBIC ACID) 500 MG tablet   No current facility-administered medications for this encounter.     Maia Plan [facility] Pre-Surgical Testing [phone] 04/04/18 1:44 PM

## 2018-04-06 MED ORDER — BUPIVACAINE LIPOSOME 1.3 % IJ SUSP
20.0000 mL | Freq: Once | INTRAMUSCULAR | Status: DC
  Filled 2018-04-06: qty 20

## 2018-04-06 MED ORDER — TRANEXAMIC ACID 1000 MG/10ML IV SOLN
2000.0000 mg | INTRAVENOUS | Status: DC
  Filled 2018-04-06: qty 20

## 2018-04-07 ENCOUNTER — Inpatient Hospital Stay (HOSPITAL_COMMUNITY): Payer: 59 | Admitting: Physician Assistant

## 2018-04-07 ENCOUNTER — Encounter (HOSPITAL_COMMUNITY)
Admission: RE | Disposition: A | Discharge status: Home Health | Payer: Self-pay | Source: Home / Self Care | Attending: Orthopedic Surgery

## 2018-04-07 ENCOUNTER — Encounter (HOSPITAL_COMMUNITY): Payer: Self-pay | Admitting: Anesthesiology

## 2018-04-07 ENCOUNTER — Other Ambulatory Visit: Payer: Self-pay

## 2018-04-07 ENCOUNTER — Inpatient Hospital Stay (HOSPITAL_COMMUNITY)
Admission: RE | Admit: 2018-04-07 | Discharge: 2018-04-09 | DRG: 470 | Disposition: A | Discharge status: Home Health | Payer: 59 | Attending: Orthopedic Surgery | Admitting: Orthopedic Surgery

## 2018-04-07 ENCOUNTER — Inpatient Hospital Stay (HOSPITAL_COMMUNITY): Payer: 59 | Admitting: Anesthesiology

## 2018-04-07 DIAGNOSIS — M25761 Osteophyte, right knee: Secondary | ICD-10-CM | POA: Diagnosis present

## 2018-04-07 DIAGNOSIS — K7689 Other specified diseases of liver: Secondary | ICD-10-CM | POA: Diagnosis present

## 2018-04-07 DIAGNOSIS — M19042 Primary osteoarthritis, left hand: Secondary | ICD-10-CM | POA: Diagnosis present

## 2018-04-07 DIAGNOSIS — I10 Essential (primary) hypertension: Secondary | ICD-10-CM | POA: Diagnosis present

## 2018-04-07 DIAGNOSIS — Z79899 Other long term (current) drug therapy: Secondary | ICD-10-CM | POA: Diagnosis not present

## 2018-04-07 DIAGNOSIS — Z91041 Radiographic dye allergy status: Secondary | ICD-10-CM

## 2018-04-07 DIAGNOSIS — I350 Nonrheumatic aortic (valve) stenosis: Secondary | ICD-10-CM | POA: Diagnosis present

## 2018-04-07 DIAGNOSIS — N400 Enlarged prostate without lower urinary tract symptoms: Secondary | ICD-10-CM | POA: Diagnosis present

## 2018-04-07 DIAGNOSIS — F329 Major depressive disorder, single episode, unspecified: Secondary | ICD-10-CM | POA: Diagnosis present

## 2018-04-07 DIAGNOSIS — E669 Obesity, unspecified: Secondary | ICD-10-CM | POA: Diagnosis present

## 2018-04-07 DIAGNOSIS — Z6834 Body mass index (BMI) 34.0-34.9, adult: Secondary | ICD-10-CM | POA: Diagnosis not present

## 2018-04-07 DIAGNOSIS — M19041 Primary osteoarthritis, right hand: Secondary | ICD-10-CM | POA: Diagnosis present

## 2018-04-07 DIAGNOSIS — Z820 Family history of epilepsy and other diseases of the nervous system: Secondary | ICD-10-CM | POA: Diagnosis not present

## 2018-04-07 DIAGNOSIS — E039 Hypothyroidism, unspecified: Secondary | ICD-10-CM | POA: Diagnosis present

## 2018-04-07 DIAGNOSIS — M1711 Unilateral primary osteoarthritis, right knee: Secondary | ICD-10-CM | POA: Diagnosis present

## 2018-04-07 DIAGNOSIS — Z888 Allergy status to other drugs, medicaments and biological substances status: Secondary | ICD-10-CM | POA: Diagnosis not present

## 2018-04-07 DIAGNOSIS — Z885 Allergy status to narcotic agent status: Secondary | ICD-10-CM

## 2018-04-07 DIAGNOSIS — K760 Fatty (change of) liver, not elsewhere classified: Secondary | ICD-10-CM | POA: Diagnosis present

## 2018-04-07 DIAGNOSIS — Z79890 Hormone replacement therapy (postmenopausal): Secondary | ICD-10-CM

## 2018-04-07 DIAGNOSIS — M21161 Varus deformity, not elsewhere classified, right knee: Secondary | ICD-10-CM | POA: Diagnosis present

## 2018-04-07 DIAGNOSIS — Z8673 Personal history of transient ischemic attack (TIA), and cerebral infarction without residual deficits: Secondary | ICD-10-CM | POA: Diagnosis not present

## 2018-04-07 DIAGNOSIS — E7800 Pure hypercholesterolemia, unspecified: Secondary | ICD-10-CM | POA: Diagnosis present

## 2018-04-07 DIAGNOSIS — Z7902 Long term (current) use of antithrombotics/antiplatelets: Secondary | ICD-10-CM

## 2018-04-07 HISTORY — PX: TOTAL KNEE ARTHROPLASTY: SHX125

## 2018-04-07 LAB — TYPE AND SCREEN
ABO/RH(D): A POS
Antibody Screen: NEGATIVE

## 2018-04-07 SURGERY — ARTHROPLASTY, KNEE, TOTAL
Anesthesia: Spinal | Site: Knee | Laterality: Right

## 2018-04-07 MED ORDER — OXYCODONE HCL 5 MG PO TABS
5.0000 mg | ORAL_TABLET | Freq: Once | ORAL | Status: AC | PRN
  Administered 2018-04-07: 5 mg via ORAL

## 2018-04-07 MED ORDER — BUPIVACAINE LIPOSOME 1.3 % IJ SUSP
INTRAMUSCULAR | Status: DC | PRN
  Administered 2018-04-07: 17.5 mL
  Administered 2018-04-07: 2.5 mL

## 2018-04-07 MED ORDER — OXYCODONE HCL 5 MG PO TABS: ORAL_TABLET | ORAL | 0 refills | Status: DC

## 2018-04-07 MED ORDER — CHLORHEXIDINE GLUCONATE 4 % EX LIQD: 60.0000 mL | Freq: Once | CUTANEOUS | Status: DC

## 2018-04-07 MED ORDER — FENTANYL CITRATE (PF) 100 MCG/2ML IJ SOLN
INTRAMUSCULAR | Status: AC
  Filled 2018-04-07: qty 2

## 2018-04-07 MED ORDER — MIDAZOLAM HCL 2 MG/2ML IJ SOLN
INTRAMUSCULAR | Status: AC
  Filled 2018-04-07: qty 2

## 2018-04-07 MED ORDER — FENTANYL CITRATE (PF) 100 MCG/2ML IJ SOLN
25.0000 ug | INTRAMUSCULAR | Status: DC | PRN
  Administered 2018-04-07: 50 ug via INTRAVENOUS
  Administered 2018-04-07 (×2): 25 ug via INTRAVENOUS

## 2018-04-07 MED ORDER — DOCUSATE SODIUM 100 MG PO CAPS
100.0000 mg | ORAL_CAPSULE | Freq: Two times a day (BID) | ORAL | Status: DC
  Administered 2018-04-07 – 2018-04-09 (×4): 100 mg via ORAL
  Filled 2018-04-07 (×4): qty 1

## 2018-04-07 MED ORDER — PROPOFOL 10 MG/ML IV BOLUS
INTRAVENOUS | Status: AC
  Filled 2018-04-07: qty 20

## 2018-04-07 MED ORDER — ONDANSETRON HCL 4 MG/2ML IJ SOLN: 4.0000 mg | Freq: Four times a day (QID) | INTRAMUSCULAR | Status: DC | PRN

## 2018-04-07 MED ORDER — LIDOCAINE 2% (20 MG/ML) 5 ML SYRINGE
INTRAMUSCULAR | Status: AC
  Filled 2018-04-07: qty 5

## 2018-04-07 MED ORDER — OXYCODONE HCL 5 MG PO TABS
ORAL_TABLET | ORAL | Status: AC
  Administered 2018-04-07: 5 mg via ORAL
  Filled 2018-04-07: qty 1

## 2018-04-07 MED ORDER — ONDANSETRON HCL 4 MG/2ML IJ SOLN
INTRAMUSCULAR | Status: AC
  Filled 2018-04-07: qty 2

## 2018-04-07 MED ORDER — METOCLOPRAMIDE HCL 5 MG PO TABS: 5.0000 mg | ORAL_TABLET | Freq: Three times a day (TID) | ORAL | Status: DC | PRN

## 2018-04-07 MED ORDER — ONDANSETRON HCL 4 MG PO TABS
4.0000 mg | ORAL_TABLET | Freq: Four times a day (QID) | ORAL | Status: DC | PRN
  Administered 2018-04-09: 4 mg via ORAL
  Filled 2018-04-07: qty 1

## 2018-04-07 MED ORDER — MIDAZOLAM HCL 2 MG/2ML IJ SOLN: 1.0000 mg | INTRAMUSCULAR | Status: DC

## 2018-04-07 MED ORDER — DEXAMETHASONE SODIUM PHOSPHATE 10 MG/ML IJ SOLN
INTRAMUSCULAR | Status: DC | PRN
  Administered 2018-04-07: 10 mg via INTRAVENOUS

## 2018-04-07 MED ORDER — LEVOTHYROXINE SODIUM 75 MCG PO TABS
150.0000 ug | ORAL_TABLET | Freq: Every day | ORAL | Status: DC
  Administered 2018-04-08 – 2018-04-09 (×2): 150 ug via ORAL
  Filled 2018-04-07 (×2): qty 2

## 2018-04-07 MED ORDER — TAMSULOSIN HCL 0.4 MG PO CAPS
0.4000 mg | ORAL_CAPSULE | Freq: Every day | ORAL | Status: DC
  Administered 2018-04-08 – 2018-04-09 (×2): 0.4 mg via ORAL
  Filled 2018-04-07 (×2): qty 1

## 2018-04-07 MED ORDER — SUGAMMADEX SODIUM 200 MG/2ML IV SOLN
INTRAVENOUS | Status: AC
  Filled 2018-04-07: qty 2

## 2018-04-07 MED ORDER — PHENOL 1.4 % MT LIQD: 1.0000 | OROMUCOSAL | Status: DC | PRN

## 2018-04-07 MED ORDER — ROSUVASTATIN CALCIUM 10 MG PO TABS
10.0000 mg | ORAL_TABLET | Freq: Every day | ORAL | Status: DC
  Administered 2018-04-08 – 2018-04-09 (×2): 10 mg via ORAL
  Filled 2018-04-07 (×2): qty 1

## 2018-04-07 MED ORDER — LACTATED RINGERS IV SOLN
INTRAVENOUS | Status: DC
  Administered 2018-04-07 (×2): via INTRAVENOUS

## 2018-04-07 MED ORDER — MENTHOL 3 MG MT LOZG: 1.0000 | LOZENGE | OROMUCOSAL | Status: DC | PRN

## 2018-04-07 MED ORDER — CITALOPRAM HYDROBROMIDE 20 MG PO TABS
20.0000 mg | ORAL_TABLET | Freq: Every day | ORAL | Status: DC
  Administered 2018-04-08 – 2018-04-09 (×2): 20 mg via ORAL
  Filled 2018-04-07 (×2): qty 1

## 2018-04-07 MED ORDER — DEXAMETHASONE SODIUM PHOSPHATE 10 MG/ML IJ SOLN
INTRAMUSCULAR | Status: AC
  Filled 2018-04-07: qty 1

## 2018-04-07 MED ORDER — METHOCARBAMOL 500 MG PO TABS: 500.0000 mg | ORAL_TABLET | Freq: Four times a day (QID) | ORAL | 0 refills | Status: DC | PRN

## 2018-04-07 MED ORDER — POLYETHYLENE GLYCOL 3350 17 G PO PACK: 17.0000 g | PACK | Freq: Every day | ORAL | Status: DC | PRN

## 2018-04-07 MED ORDER — METHOCARBAMOL 500 MG IVPB - SIMPLE MED
INTRAVENOUS | Status: AC
  Filled 2018-04-07: qty 50

## 2018-04-07 MED ORDER — BISACODYL 10 MG RE SUPP: 10.0000 mg | Freq: Every day | RECTAL | Status: DC | PRN

## 2018-04-07 MED ORDER — KETAMINE HCL 10 MG/ML IJ SOLN
INTRAMUSCULAR | Status: AC
  Filled 2018-04-07: qty 1

## 2018-04-07 MED ORDER — TRANEXAMIC ACID-NACL 1000-0.7 MG/100ML-% IV SOLN
1000.0000 mg | Freq: Once | INTRAVENOUS | Status: AC
  Administered 2018-04-07: 1000 mg via INTRAVENOUS
  Filled 2018-04-07: qty 100

## 2018-04-07 MED ORDER — CEFAZOLIN SODIUM-DEXTROSE 2-4 GM/100ML-% IV SOLN
2.0000 g | INTRAVENOUS | Status: AC
  Administered 2018-04-07: 2 g via INTRAVENOUS
  Filled 2018-04-07: qty 100

## 2018-04-07 MED ORDER — PROPOFOL 10 MG/ML IV BOLUS
INTRAVENOUS | Status: AC
  Filled 2018-04-07: qty 40

## 2018-04-07 MED ORDER — BUPIVACAINE-EPINEPHRINE (PF) 0.25% -1:200000 IJ SOLN
INTRAMUSCULAR | Status: AC
  Filled 2018-04-07: qty 30

## 2018-04-07 MED ORDER — ACETAMINOPHEN 500 MG PO TABS
1000.0000 mg | ORAL_TABLET | Freq: Four times a day (QID) | ORAL | Status: AC
  Administered 2018-04-07 – 2018-04-08 (×4): 1000 mg via ORAL
  Filled 2018-04-07 (×4): qty 2

## 2018-04-07 MED ORDER — ROCURONIUM BROMIDE 10 MG/ML (PF) SYRINGE
PREFILLED_SYRINGE | INTRAVENOUS | Status: AC
  Filled 2018-04-07: qty 10

## 2018-04-07 MED ORDER — FLEET ENEMA 7-19 GM/118ML RE ENEM: 1.0000 | ENEMA | Freq: Once | RECTAL | Status: DC | PRN

## 2018-04-07 MED ORDER — ONDANSETRON HCL 4 MG/2ML IJ SOLN
INTRAMUSCULAR | Status: DC | PRN
  Administered 2018-04-07: 4 mg via INTRAVENOUS

## 2018-04-07 MED ORDER — SODIUM CHLORIDE (PF) 0.9 % IJ SOLN
INTRAMUSCULAR | Status: AC
  Filled 2018-04-07: qty 10

## 2018-04-07 MED ORDER — HYDROMORPHONE HCL 1 MG/ML IJ SOLN
0.5000 mg | INTRAMUSCULAR | Status: DC | PRN
  Administered 2018-04-07 – 2018-04-08 (×6): 1 mg via INTRAVENOUS
  Filled 2018-04-07 (×7): qty 1

## 2018-04-07 MED ORDER — ACETAMINOPHEN 325 MG PO TABS: 325.0000 mg | ORAL_TABLET | Freq: Four times a day (QID) | ORAL | Status: DC | PRN

## 2018-04-07 MED ORDER — TRANEXAMIC ACID 1000 MG/10ML IV SOLN
INTRAVENOUS | Status: DC | PRN
  Administered 2018-04-07: 2000 mg via TOPICAL

## 2018-04-07 MED ORDER — MIDAZOLAM HCL 2 MG/2ML IJ SOLN
INTRAMUSCULAR | Status: DC | PRN
  Administered 2018-04-07: 2 mg via INTRAVENOUS

## 2018-04-07 MED ORDER — FENTANYL CITRATE (PF) 100 MCG/2ML IJ SOLN: 50.0000 ug | INTRAMUSCULAR | Status: DC

## 2018-04-07 MED ORDER — CLOPIDOGREL BISULFATE 75 MG PO TABS
75.0000 mg | ORAL_TABLET | Freq: Every day | ORAL | Status: DC
  Administered 2018-04-08 – 2018-04-09 (×2): 75 mg via ORAL
  Filled 2018-04-07 (×2): qty 1

## 2018-04-07 MED ORDER — ROPIVACAINE HCL 5 MG/ML IJ SOLN
INTRAMUSCULAR | Status: DC | PRN
  Administered 2018-04-07: 20 mL via PERINEURAL

## 2018-04-07 MED ORDER — SODIUM CHLORIDE 0.9 % IV SOLN: INTRAVENOUS | Status: DC

## 2018-04-07 MED ORDER — SODIUM CHLORIDE 0.9% FLUSH
INTRAVENOUS | Status: DC | PRN
  Administered 2018-04-07: 5 mL
  Administered 2018-04-07: 45 mL

## 2018-04-07 MED ORDER — SUFENTANIL CITRATE 50 MCG/ML IV SOLN
INTRAVENOUS | Status: AC
  Filled 2018-04-07: qty 1

## 2018-04-07 MED ORDER — LISINOPRIL 10 MG PO TABS
10.0000 mg | ORAL_TABLET | Freq: Every day | ORAL | Status: DC
  Administered 2018-04-07 – 2018-04-09 (×3): 10 mg via ORAL
  Filled 2018-04-07 (×3): qty 1

## 2018-04-07 MED ORDER — SODIUM CHLORIDE 0.9 % IV SOLN
INTRAVENOUS | Status: DC
  Administered 2018-04-07: 14:00:00 via INTRAVENOUS

## 2018-04-07 MED ORDER — TRANEXAMIC ACID-NACL 1000-0.7 MG/100ML-% IV SOLN
1000.0000 mg | INTRAVENOUS | Status: AC
  Administered 2018-04-07: 1000 mg via INTRAVENOUS
  Filled 2018-04-07: qty 100

## 2018-04-07 MED ORDER — METOCLOPRAMIDE HCL 5 MG/ML IJ SOLN: 5.0000 mg | Freq: Three times a day (TID) | INTRAMUSCULAR | Status: DC | PRN

## 2018-04-07 MED ORDER — ASPIRIN 81 MG PO CHEW
81.0000 mg | CHEWABLE_TABLET | Freq: Two times a day (BID) | ORAL | Status: DC
  Administered 2018-04-07 – 2018-04-09 (×4): 81 mg via ORAL
  Filled 2018-04-07 (×4): qty 1

## 2018-04-07 MED ORDER — OXYCODONE HCL 5 MG PO TABS
5.0000 mg | ORAL_TABLET | ORAL | Status: DC | PRN
  Administered 2018-04-07: 10 mg via ORAL
  Administered 2018-04-07: 5 mg via ORAL
  Administered 2018-04-07 – 2018-04-08 (×5): 10 mg via ORAL
  Administered 2018-04-08: 5 mg via ORAL
  Administered 2018-04-09: 10 mg via ORAL
  Administered 2018-04-09: 5 mg via ORAL
  Administered 2018-04-09: 10 mg via ORAL
  Filled 2018-04-07 (×4): qty 2
  Filled 2018-04-07: qty 1
  Filled 2018-04-07 (×2): qty 2
  Filled 2018-04-07: qty 1
  Filled 2018-04-07 (×3): qty 2

## 2018-04-07 MED ORDER — SUFENTANIL CITRATE 50 MCG/ML IV SOLN
INTRAVENOUS | Status: DC | PRN
  Administered 2018-04-07 (×3): 5 ug via INTRAVENOUS

## 2018-04-07 MED ORDER — METHOCARBAMOL 500 MG IVPB - SIMPLE MED
500.0000 mg | Freq: Four times a day (QID) | INTRAVENOUS | Status: DC | PRN
  Administered 2018-04-07: 500 mg via INTRAVENOUS
  Filled 2018-04-07: qty 50

## 2018-04-07 MED ORDER — METHOCARBAMOL 500 MG PO TABS
500.0000 mg | ORAL_TABLET | Freq: Four times a day (QID) | ORAL | Status: DC | PRN
  Administered 2018-04-07 – 2018-04-09 (×7): 500 mg via ORAL
  Filled 2018-04-07 (×7): qty 1

## 2018-04-07 MED ORDER — ASPIRIN EC 81 MG PO TBEC: 81.0000 mg | DELAYED_RELEASE_TABLET | Freq: Two times a day (BID) | ORAL | 0 refills | Status: AC

## 2018-04-07 MED ORDER — DIPHENHYDRAMINE HCL 12.5 MG/5ML PO ELIX
12.5000 mg | ORAL_SOLUTION | ORAL | Status: DC | PRN
  Administered 2018-04-08: 25 mg via ORAL
  Filled 2018-04-07: qty 10

## 2018-04-07 MED ORDER — DULOXETINE HCL 60 MG PO CPEP
60.0000 mg | ORAL_CAPSULE | Freq: Every day | ORAL | Status: DC
  Administered 2018-04-08 – 2018-04-09 (×2): 60 mg via ORAL
  Filled 2018-04-07 (×2): qty 1

## 2018-04-07 MED ORDER — PHENYLEPHRINE HCL 10 MG/ML IJ SOLN
INTRAMUSCULAR | Status: AC
  Filled 2018-04-07: qty 1

## 2018-04-07 MED ORDER — CEFAZOLIN SODIUM-DEXTROSE 2-4 GM/100ML-% IV SOLN
2.0000 g | Freq: Four times a day (QID) | INTRAVENOUS | Status: AC
  Administered 2018-04-07 (×2): 2 g via INTRAVENOUS
  Filled 2018-04-07 (×3): qty 100

## 2018-04-07 MED ORDER — PROPOFOL 10 MG/ML IV BOLUS
INTRAVENOUS | Status: DC | PRN
  Administered 2018-04-07 (×5): 20 mg via INTRAVENOUS

## 2018-04-07 MED ORDER — BUPIVACAINE IN DEXTROSE 0.75-8.25 % IT SOLN
INTRATHECAL | Status: DC | PRN
  Administered 2018-04-07: 2 mL via INTRATHECAL

## 2018-04-07 MED ORDER — SODIUM CHLORIDE (PF) 0.9 % IJ SOLN
INTRAMUSCULAR | Status: AC
  Filled 2018-04-07: qty 50

## 2018-04-07 MED ORDER — PROPOFOL 500 MG/50ML IV EMUL
INTRAVENOUS | Status: DC | PRN
  Administered 2018-04-07: 75 ug/kg/min via INTRAVENOUS

## 2018-04-07 MED ORDER — SODIUM CHLORIDE 0.9 % IR SOLN
Status: DC | PRN
  Administered 2018-04-07 (×2): 1000 mL

## 2018-04-07 MED ORDER — OXYCODONE HCL 5 MG/5ML PO SOLN: 5.0000 mg | Freq: Once | ORAL | Status: AC | PRN

## 2018-04-07 MED ORDER — BUPIVACAINE-EPINEPHRINE (PF) 0.25% -1:200000 IJ SOLN
INTRAMUSCULAR | Status: DC | PRN
  Administered 2018-04-07: 25 mL
  Administered 2018-04-07: 5 mL

## 2018-04-07 SURGICAL SUPPLY — 69 items
APL PRP STRL LF DISP 70% ISPRP (MISCELLANEOUS) ×2
ATTUNE MED DOME PAT 38 KNEE (Knees) ×1 IMPLANT
ATTUNE MED DOME PAT 38MM KNEE (Knees) ×1 IMPLANT
ATTUNE PS FEM RT SZ 6 CEM KNEE (Femur) ×3 IMPLANT
ATTUNE PSRP INSR SZ6 5 KNEE (Insert) ×1 IMPLANT
ATTUNE PSRP INSR SZ6 5MM KNEE (Insert) ×1 IMPLANT
BAG DECANTER FOR FLEXI CONT (MISCELLANEOUS) ×3 IMPLANT
BAG SPEC THK2 15X12 ZIP CLS (MISCELLANEOUS) ×1
BAG ZIPLOCK 12X15 (MISCELLANEOUS) ×3 IMPLANT
BASE TIBIAL ROT PLAT SZ 7 KNEE (Knees) IMPLANT
BLADE SAGITTAL 25.0X1.19X90 (BLADE) ×2 IMPLANT
BLADE SAGITTAL 25.0X1.19X90MM (BLADE) ×1
BLADE SAW SGTL 13X75X1.27 (BLADE) ×3 IMPLANT
BLADE SURG 15 STRL LF DISP TIS (BLADE) ×1 IMPLANT
BLADE SURG 15 STRL SS (BLADE) ×3
BLADE SURG SZ10 CARB STEEL (BLADE) ×9 IMPLANT
BNDG CMPR MED 15X6 ELC VLCR LF (GAUZE/BANDAGES/DRESSINGS) ×1
BNDG ELASTIC 6X15 VLCR STRL LF (GAUZE/BANDAGES/DRESSINGS) ×3 IMPLANT
BOWL SMART MIX CTS (DISPOSABLE) ×3 IMPLANT
BSPLAT TIB 7 CMNT ROT PLAT STR (Knees) ×1 IMPLANT
CEMENT HV SMART SET (Cement) ×6 IMPLANT
CHLORAPREP W/TINT 26 (MISCELLANEOUS) ×6 IMPLANT
COVER SURGICAL LIGHT HANDLE (MISCELLANEOUS) ×3 IMPLANT
COVER WAND RF STERILE (DRAPES) ×3 IMPLANT
CUFF TOURN SGL QUICK 34 (TOURNIQUET CUFF) ×3
CUFF TRNQT CYL 34X4.125X (TOURNIQUET CUFF) ×1 IMPLANT
DECANTER SPIKE VIAL GLASS SM (MISCELLANEOUS) ×6 IMPLANT
DRAPE INCISE IOBAN 66X45 STRL (DRAPES) IMPLANT
DRAPE LG THREE QUARTER DISP (DRAPES) ×3 IMPLANT
DRAPE ORTHO SPLIT 77X108 STRL (DRAPES) ×6
DRAPE SURG ORHT 6 SPLT 77X108 (DRAPES) ×2 IMPLANT
DRAPE U-SHAPE 47X51 STRL (DRAPES) ×3 IMPLANT
DRSG ADAPTIC 3X8 NADH LF (GAUZE/BANDAGES/DRESSINGS) ×3 IMPLANT
DRSG PAD ABDOMINAL 8X10 ST (GAUZE/BANDAGES/DRESSINGS) ×6 IMPLANT
ELECT REM PT RETURN 15FT ADLT (MISCELLANEOUS) ×3 IMPLANT
GAUZE SPONGE 4X4 12PLY STRL (GAUZE/BANDAGES/DRESSINGS) ×3 IMPLANT
GLOVE BIOGEL PI IND STRL 8 (GLOVE) ×2 IMPLANT
GLOVE BIOGEL PI INDICATOR 8 (GLOVE) ×4
GLOVE SURG ORTHO 8.0 STRL STRW (GLOVE) ×3 IMPLANT
GLOVE SURG SS PI 7.5 STRL IVOR (GLOVE) ×3 IMPLANT
GOWN STRL REUS W/TWL XL LVL3 (GOWN DISPOSABLE) ×6 IMPLANT
HANDPIECE INTERPULSE COAX TIP (DISPOSABLE) ×3
HOLDER FOLEY CATH W/STRAP (MISCELLANEOUS) ×2 IMPLANT
HOOD PEEL AWAY FLYTE STAYCOOL (MISCELLANEOUS) ×3 IMPLANT
IMMOBILIZER KNEE 20 (SOFTGOODS) ×3
IMMOBILIZER KNEE 20 THIGH 36 (SOFTGOODS) ×1 IMPLANT
KIT TURNOVER KIT A (KITS) IMPLANT
MANIFOLD NEPTUNE II (INSTRUMENTS) ×3 IMPLANT
NEEDLE HYPO 22GX1.5 SAFETY (NEEDLE) ×6 IMPLANT
NS IRRIG 1000ML POUR BTL (IV SOLUTION) ×3 IMPLANT
PACK ICE MAXI GEL EZY WRAP (MISCELLANEOUS) ×3 IMPLANT
PACK TOTAL KNEE CUSTOM (KITS) ×3 IMPLANT
PADDING CAST ABS 6INX4YD NS (CAST SUPPLIES) ×4
PADDING CAST ABS COTTON 6X4 NS (CAST SUPPLIES) IMPLANT
PADDING CAST COTTON 6X4 STRL (CAST SUPPLIES) ×6 IMPLANT
PIN STEINMAN FIXATION KNEE (PIN) ×3 IMPLANT
PROTECTOR NERVE ULNAR (MISCELLANEOUS) ×3 IMPLANT
SET HNDPC FAN SPRY TIP SCT (DISPOSABLE) ×1 IMPLANT
STAPLER VISISTAT 35W (STAPLE) ×2 IMPLANT
SUT ETHIBOND NAB CT1 #1 30IN (SUTURE) ×9 IMPLANT
SUT VIC AB 0 CT1 36 (SUTURE) ×3 IMPLANT
SUT VIC AB 2-0 CT1 27 (SUTURE) ×6
SUT VIC AB 2-0 CT1 TAPERPNT 27 (SUTURE) ×2 IMPLANT
SYR CONTROL 10ML LL (SYRINGE) ×6 IMPLANT
TIBIAL BASE ROT PLAT SZ 7 KNEE (Knees) ×3 IMPLANT
TRAY FOLEY MTR SLVR 16FR STAT (SET/KITS/TRAYS/PACK) ×3 IMPLANT
WATER STERILE IRR 1000ML POUR (IV SOLUTION) ×6 IMPLANT
WRAP KNEE MAXI GEL POST OP (GAUZE/BANDAGES/DRESSINGS) ×2 IMPLANT
YANKAUER SUCT BULB TIP 10FT TU (MISCELLANEOUS) ×6 IMPLANT

## 2018-04-07 NOTE — Anesthesia Procedure Notes (Signed)
Date/Time: 04/07/2018 7:07 AM Performed by: Sharlette Dense, CRNA Oxygen Delivery Method: Nasal cannula

## 2018-04-07 NOTE — Discharge Instructions (Signed)

## 2018-04-07 NOTE — Op Note (Signed)
NAME: Randall Flores, [place] MEDICAL RECORD [id number] ACCOUNT [id number] DATE OF BIRTH:05-18-1955 FACILITY: [facility] LOCATION: [facility] PHYSICIAN:W. Samanyu Tinnell JR., MD  OPERATIVE REPORT  DATE OF PROCEDURE:  04/07/2018  PREOPERATIVE DIAGNOSES:  Severe osteoarthritis of right knee with varus deformity.  POSTOPERATIVE DIAGNOSES:  Severe osteoarthritis of right knee with varus deformity.  OPERATION:  Right total knee replacement (Attune size 7 tibia, size 6 femur, 38 mm all poly patella with size 6, 5 mm insert).  SURGEON:  Vangie Bicker, MD  ASSISTANTMarjo Bicker, PA  ANESTHESIA:  Spinal anesthetic with adductor block.  TOURNIQUET TIME:  55 minutes.  DESCRIPTION OF PROCEDURE:  Supine position, exsanguination of leg, inflation of thigh tourniquet to 350.  Straight skin incision was made with a medial parapatellar approach to the knee made.  We did a 5-degree 7 mm distal femoral cut followed by placing  the external guide for the tibial cut, cutting about 6 mm above the least diseased compartment.  Extension gap was measured at 5 mm.  Femur was sized to be a size 6 followed by placement of all-in-1 cutting block in appropriate degree of external  rotation, accomplishing the anterior, posterior chamfer cuts.  PCL was released.  Small osteophytes were removed from the posterior aspect of the knee, and the extension gap matched the flexion gap at 5 mm.  Tibia was sized for a size 7.  Placement of  the trial tibia with the appropriate keel cut.  Then accomplished the box cut on the femur.  Trial components and the real tibia were placed.  Resolution of the varus deformity was noted with a flexion contracture which was noted preoperatively was  resolved as well.  The patella was cut, resecting 9.5 mm of the patella and placed in all 38 mm all poly trial.  All parameters deemed to be acceptable with normal patellar tracking.  Good balancing of the ligaments.  No tendency for laxity and good   tracking and all degrees flexion.  Cement was prepared on the back table.  A mixture of Marcaine and Exparel was infiltrated into the capsular tissues and subcutaneous tissues.  Final components were inserted, tibia followed by femur and patella with the  trial bearing in place.  Cement was allowed to harden.  Small bits of cement were removed.  Removed the trial bearing.  The tourniquet was released under direct vision.  Small bleeders were coagulated.  No excessive bleeding was noted.  Final bearing  was placed, and again all parameters were deemed to be acceptable.  Closure was affected on the arthrotomy with #1 Ethibond, subcutaneous tissue with 2-0 Vicryl, and the skin with a stapler.  A lightly compressive sterile dressing was applied.  LN/NUANCE  D:04/07/2018 T:04/07/2018 JOB:[id number]

## 2018-04-07 NOTE — Anesthesia Procedure Notes (Addendum)
Date/Time: 04/07/2018 7:36 AM Performed by: Sharlette Dense, CRNA Oxygen Delivery Method: Simple face mask

## 2018-04-07 NOTE — Interval H&P Note (Signed)
History and Physical Interval Note:  04/07/2018 7:20 AM  Randall Flores.  has presented today for surgery, with the diagnosis of OA RIGHT KNEE.  The various methods of treatment have been discussed with the patient and family. After consideration of risks, benefits and other options for treatment, the patient has consented to  Procedure(s): TOTAL KNEE ARTHROPLASTY (Right) as a surgical intervention.  The patient's history has been reviewed, patient examined, no change in status, stable for surgery.  I have reviewed the patient's chart and labs.  Questions were answered to the patient's satisfaction.     [name]

## 2018-04-07 NOTE — Transfer of Care (Signed)
Immediate Anesthesia Transfer of Care Note  Patient: Randall Flores.  Procedure(s) Performed: TOTAL KNEE ARTHROPLASTY (Right Knee)  Patient Location: PACU  Anesthesia Type:Spinal  Level of Consciousness: awake and alert   Airway & Oxygen Therapy: Patient Spontanous Breathing and Patient connected to face mask oxygen  Post-op Assessment: Report given to RN and Post -op Vital signs reviewed and stable  Post vital signs: Reviewed and stable  Last Vitals:  Vitals Value Taken Time  BP    Temp    Pulse    Resp    SpO2      Last Pain:  Vitals:   04/07/18 0644  TempSrc:   PainSc: 3       Patients Stated Pain Goal: 3 (75/10/25 8527)  Complications: No apparent anesthesia complications

## 2018-04-07 NOTE — Evaluation (Signed)
Physical Therapy Evaluation Patient Details Name: Randall Flores. MRN: [id number] DOB: Jun 10, 1955 Today's Date: 04/07/2018   History of Present Illness  Pt s/p R TKR and with hx of DDD and multiple back surgery  Clinical Impression  Pt s/p R TKR and presents with decreased R LE strength/ROM and post op pain limiting functional mobility.  Pt should progress to dc home with family assist.    Follow Up Recommendations      Equipment Recommendations  None recommended by PT    Recommendations for Other Services       Precautions / Restrictions Precautions Precautions: Knee;Fall Required Braces or Orthoses: Knee Immobilizer - Right Knee Immobilizer - Right: Discontinue once straight leg raise with < 10 degree lag(Pt performed IND SLR this date) Restrictions Weight Bearing Restrictions: No Other Position/Activity Restrictions: WBAT      Mobility  Bed Mobility Overal bed mobility: Needs Assistance Bed Mobility: Supine to Sit     Supine to sit: Min assist;+2 for safety/equipment;+2 for physical assistance     General bed mobility comments: cues for sequence and use of L LE to self assist; physical assist to manage R LE and to bring trunk to upright  Transfers Overall transfer level: Needs assistance Equipment used: Rolling walker (2 wheeled) Transfers: Sit to/from Stand Sit to Stand: Min assist         General transfer comment: cues for LE management and use of UEs to self assist  Ambulation/Gait Ambulation/Gait assistance: Min assist;+2 safety/equipment Gait Distance (Feet): 33 Feet Assistive device: Rolling walker (2 wheeled) Gait Pattern/deviations: Step-to pattern;Decreased step length - right;Decreased step length - left;Shuffle;Trunk flexed Gait velocity: decr   General Gait Details: cues for sequence, posture and position from RW; distance ltd by increasing dizziness - BP 102/62  Stairs            Wheelchair Mobility    Modified Rankin (Stroke  Patients Only)       Balance Overall balance assessment: Mild deficits observed, not formally tested                                           Pertinent Vitals/Pain Pain Assessment: 0-10 Pain Score: 5  Pain Location: R knee Pain Descriptors / Indicators: Aching;Sore Pain Intervention(s): Limited activity within patient's tolerance;Monitored during session;Premedicated before session;Ice applied    Home Living Family/patient expects to be discharged to:: Private residence Living Arrangements: Spouse/significant other Available Help at Discharge: Family Type of Home: House Home Access: Stairs to enter   Technical brewer of Steps: 1 Home Layout: Able to live on main level with bedroom/bathroom Home Equipment: Gilford Rile - 2 wheels      Prior Function Level of Independence: Independent               Hand Dominance        Extremity/Trunk Assessment   Upper Extremity Assessment Upper Extremity Assessment: Overall WFL for tasks assessed    Lower Extremity Assessment Lower Extremity Assessment: RLE deficits/detail    Cervical / Trunk Assessment Cervical / Trunk Assessment: Normal  Communication   Communication: No difficulties  Cognition Arousal/Alertness: Awake/alert Behavior During Therapy: WFL for tasks assessed/performed Overall Cognitive Status: Within Functional Limits for tasks assessed  General Comments      Exercises Total Joint Exercises Ankle Circles/Pumps: AROM;Both;15 reps;Supine   Assessment/Plan    PT Assessment Patient needs continued PT services  PT Problem List Decreased strength;Decreased range of motion;Decreased activity tolerance;Decreased mobility;Decreased knowledge of use of DME;Pain       PT Treatment Interventions DME instruction;Gait training;Stair training;Functional mobility training;Therapeutic activities;Therapeutic exercise;Patient/family  education    PT Goals (Current goals can be found in the Care Plan section)  Acute Rehab PT Goals Patient Stated Goal: Regain IND PT Goal Formulation: With patient Time For Goal Achievement: 04/14/18 Potential to Achieve Goals: Good    Frequency 7X/week   Barriers to discharge        Co-evaluation               AM-PAC PT "6 Clicks" Mobility  Outcome Measure Help needed turning from your back to your side while in a flat bed without using bedrails?: A Little Help needed moving from lying on your back to sitting on the side of a flat bed without using bedrails?: A Lot Help needed moving to and from a bed to a chair (including a wheelchair)?: A Little Help needed standing up from a chair using your arms (e.g., wheelchair or bedside chair)?: A Little Help needed to walk in hospital room?: A Little Help needed climbing 3-5 steps with a railing? : A Lot 6 Click Score: 16    End of Session Equipment Utilized During Treatment: Gait belt Activity Tolerance: Patient tolerated treatment well;Patient limited by fatigue;Other (comment)(dizziness) Patient left: in chair;with call bell/phone within reach;with nursing/sitter in room;with chair alarm set Nurse Communication: Mobility status PT Visit Diagnosis: Difficulty in walking, not elsewhere classified (R26.2)    Time: 1545-1610 PT Time Calculation (min) (ACUTE ONLY): 25 min   Charges:   PT Evaluation $PT Eval Low Complexity: 1 Low PT Treatments $Gait Training: 8-22 mins        [place] Pager [phone] Office [phone]   Keeyon Privitera 04/07/2018, 6:04 PM

## 2018-04-07 NOTE — Brief Op Note (Signed)
04/07/2018  9:46 AM  PATIENT:  Randall Flores.  63 y.o. male  PRE-OPERATIVE DIAGNOSIS:  OA RIGHT KNEE  POST-OPERATIVE DIAGNOSIS:  OA RIGHT KNEE  PROCEDURE:  Procedure(s): TOTAL KNEE ARTHROPLASTY (Right)  SURGEON:  Surgeon(s) and Role:    Earlie Server, MD - Primary  PHYSICIAN ASSISTANT: Chriss Czar, PA-C  ASSISTANTS: OR staff x1   ANESTHESIA:   local, regional, spinal and IV sedation  EBL:  150 mL   BLOOD ADMINISTERED:none  DRAINS: none   LOCAL MEDICATIONS USED:  MARCAINE     SPECIMEN:  No Specimen  DISPOSITION OF SPECIMEN:  N/A  COUNTS:  YES  TOURNIQUET:   Total Tourniquet Time Documented: Thigh (Right) - 57 minutes Total: Thigh (Right) - 57 minutes   DICTATION: .Other Dictation: Dictation Number unknown  PLAN OF CARE: Admit to inpatient   PATIENT DISPOSITION:  PACU - hemodynamically stable.   Delay start of Pharmacological VTE agent (>24hrs) due to surgical blood loss or risk of bleeding: yes

## 2018-04-07 NOTE — Anesthesia Procedure Notes (Signed)
Spinal  Patient location during procedure: OR Start time: 04/07/2018 7:36 AM End time: 04/07/2018 7:41 AM Staffing Anesthesiologist: Albertha Ghee, MD Performed: anesthesiologist  Preanesthetic Checklist Completed: patient identified, surgical consent, pre-op evaluation, timeout performed, IV checked, risks and benefits discussed and monitors and equipment checked Spinal Block Patient position: sitting Prep: DuraPrep Patient monitoring: cardiac monitor, continuous pulse ox and blood pressure Approach: midline Location: L3-4 Injection technique: single-shot Needle Needle type: Pencan  Needle gauge: 24 G Needle length: 9 cm Assessment Sensory level: T10 Additional Notes Functioning IV was confirmed and monitors were applied. Sterile prep and drape, including hand hygiene and sterile gloves were used. The patient was positioned and the spine was prepped. The skin was anesthetized with lidocaine.  Free flow of clear CSF was obtained prior to injecting local anesthetic into the CSF.  The spinal needle aspirated freely following injection.  The needle was carefully withdrawn.  The patient tolerated the procedure well.

## 2018-04-07 NOTE — Anesthesia Postprocedure Evaluation (Signed)
Anesthesia Post Note  Patient: Randall Flores.  Procedure(s) Performed: TOTAL KNEE ARTHROPLASTY (Right Knee)     Patient location during evaluation: PACU Anesthesia Type: Spinal Level of consciousness: oriented and awake and alert Pain management: pain level controlled Vital Signs Assessment: post-procedure vital signs reviewed and stable Respiratory status: spontaneous breathing, respiratory function stable and patient connected to nasal cannula oxygen Cardiovascular status: blood pressure returned to baseline and stable Postop Assessment: no headache, no backache and no apparent nausea or vomiting Anesthetic complications: no    Last Vitals:  Vitals:   04/07/18 1100 04/07/18 1115  BP: (!) 155/78 (!) 143/85  Pulse: 71 72  Resp: (!) 0 (!) 5  Temp:    SpO2: 99% 99%    Last Pain:  Vitals:   04/07/18 1100  TempSrc:   PainSc: [place]

## 2018-04-07 NOTE — Anesthesia Procedure Notes (Signed)
Anesthesia Regional Block: Adductor canal block   Pre-Anesthetic Checklist: ,, timeout performed, Correct Patient, Correct Site, Correct Laterality, Correct Procedure, Correct Position, site marked, Risks and benefits discussed,  Surgical consent,  Pre-op evaluation,  At surgeon's request and post-op pain management  Laterality: Right  Prep: chloraprep       Needles:  Injection technique: Single-shot  Needle Type: Echogenic Needle     Needle Length: 9cm  Needle Gauge: 21     Additional Needles:   Narrative:  Start time: 04/07/2018 7:10 AM End time: 04/07/2018 7:15 AM Injection made incrementally with aspirations every 5 mL.  Performed by: Personally  Anesthesiologist: Albertha Ghee, MD  Additional Notes: Pt tolerated the procedure well.

## 2018-04-08 LAB — CBC
HCT: 34.6 % — ABNORMAL LOW (ref 39.0–52.0)
Hemoglobin: 11 g/dL — ABNORMAL LOW (ref 13.0–17.0)
MCH: 29.3 pg (ref 26.0–34.0)
MCHC: 31.8 g/dL (ref 30.0–36.0)
MCV: 92.3 fL (ref 80.0–100.0)
Platelets: 225 10*3/uL (ref 150–400)
RBC: 3.75 MIL/uL — AB (ref 4.22–5.81)
RDW: 12.4 % (ref 11.5–15.5)
WBC: 12.4 10*3/uL — ABNORMAL HIGH (ref 4.0–10.5)
nRBC: 0 % (ref 0.0–0.2)

## 2018-04-08 LAB — BASIC METABOLIC PANEL
Anion gap: 8 (ref 5–15)
BUN: 16 mg/dL (ref 8–23)
CO2: 24 mmol/L (ref 22–32)
Calcium: 8.5 mg/dL — ABNORMAL LOW (ref 8.9–10.3)
Chloride: 104 mmol/L (ref 98–111)
Creatinine, Ser: 0.9 mg/dL (ref 0.61–1.24)
GFR calc Af Amer: >60 mL/min (ref >60)
GFR calc non Af Amer: >60 mL/min (ref >60)
Glucose, Bld: 134 mg/dL — ABNORMAL HIGH (ref 70–99)
Potassium: 4.1 mmol/L (ref 3.5–5.1)
Sodium: 136 mmol/L (ref 135–145)

## 2018-04-08 NOTE — Progress Notes (Signed)
Physical Therapy Treatment Patient Details Name: Randall Flores. MRN: [id number] DOB: Dec 10, 1955 Today's Date: 04/08/2018    History of Present Illness Pt s/p R TKR and with hx of DDD and multiple back surgery    PT Comments    Pt continues motivated but limited by pain.   Follow Up Recommendations  Follow surgeon's recommendation for DC plan and follow-up therapies     Equipment Recommendations  None recommended by PT    Recommendations for Other Services       Precautions / Restrictions Precautions Precautions: Knee;Fall Required Braces or Orthoses: Knee Immobilizer - Right Knee Immobilizer - Right: Discontinue once straight leg raise with < 10 degree lag Restrictions Weight Bearing Restrictions: No Other Position/Activity Restrictions: WBAT    Mobility  Bed Mobility Overal bed mobility: Needs Assistance Bed Mobility: Sit to Supine     Supine to sit: Min assist Sit to supine: Min assist   General bed mobility comments: cues for sequence and use of L LE to self assist; physical assist to manage R LE   Transfers Overall transfer level: Needs assistance Equipment used: Rolling walker (2 wheeled) Transfers: Sit to/from Stand Sit to Stand: Min guard         General transfer comment: cues for LE management and use of UEs to self assist  Ambulation/Gait Ambulation/Gait assistance: Min assist;Min guard Gait Distance (Feet): 57 Feet Assistive device: Rolling walker (2 wheeled) Gait Pattern/deviations: Step-to pattern;Decreased step length - right;Decreased step length - left;Shuffle;Trunk flexed Gait velocity: decr   General Gait Details: Increased time with cues for sequence, posture and position from [employer]             Wheelchair Mobility    Modified Rankin (Stroke Patients Only)       Balance Overall balance assessment: Mild deficits observed, not formally tested                                           Cognition Arousal/Alertness: Awake/alert Behavior During Therapy: WFL for tasks assessed/performed Overall Cognitive Status: Within Functional Limits for tasks assessed                                        Exercises Total Joint Exercises Ankle Circles/Pumps: AROM;Both;15 reps;Supine Quad Sets: AROM;Both;10 reps;Supine Heel Slides: AAROM;Right;15 reps;Supine Straight Leg Raises: AAROM;AROM;Right;10 reps;Supine Goniometric ROM: AAROM -8- 40 R knee    General Comments        Pertinent Vitals/Pain Pain Assessment: 0-10 Pain Score: 8  Pain Location: R knee Pain Descriptors / Indicators: Aching;Sore Pain Intervention(s): Limited activity within patient's tolerance;Monitored during session;Premedicated before session;Ice applied;Patient requesting pain meds-RN notified    Home Living                      Prior Function            PT Goals (current goals can now be found in the care plan section) Acute Rehab PT Goals Patient Stated Goal: Regain IND PT Goal Formulation: With patient Time For Goal Achievement: 04/14/18 Potential to Achieve Goals: Good Progress towards PT goals: Progressing toward goals    Frequency    7X/week      PT Plan Current plan remains appropriate    Co-evaluation  AM-PAC PT "6 Clicks" Mobility   Outcome Measure  Help needed turning from your back to your side while in a flat bed without using bedrails?: A Little Help needed moving from lying on your back to sitting on the side of a flat bed without using bedrails?: A Little Help needed moving to and from a bed to a chair (including a wheelchair)?: A Little Help needed standing up from a chair using your arms (e.g., wheelchair or bedside chair)?: A Little Help needed to walk in hospital room?: A Little Help needed climbing 3-5 steps with a railing? : A Lot 6 Click Score: 17    End of Session Equipment Utilized During Treatment: Gait  belt Activity Tolerance: Patient tolerated treatment well;Patient limited by fatigue;Other (comment) Patient left: with call bell/phone within reach;in bed;with family/visitor present Nurse Communication: Mobility status PT Visit Diagnosis: Difficulty in walking, not elsewhere classified (R26.2)     Time: 4401-0272 PT Time Calculation (min) (ACUTE ONLY): 22 min  Charges:  $Gait Training: 8-22 mins $Therapeutic Exercise: 8-22 mins                     [place] Pager [phone] Office [phone]    Colten Desroches 04/08/2018, 3:47 PM

## 2018-04-08 NOTE — Progress Notes (Addendum)
OT Cancellation Note  Patient Details Name: Randall Flores. MRN: [id number] DOB: 1955-01-30   Cancelled Treatment:    Reason Eval/Treat Not Completed: Other (comment).  Checked on pt this am, and he was in too much pain to get OOB.  He is due for pain medication in less than an hour. Will check later this pm.  Danel Requena 04/08/2018, 1:23 PM  Lesle Chris, OTR/L Acute Rehabilitation Services [phone] [facility] pager [phone] office 04/08/2018

## 2018-04-08 NOTE — Evaluation (Signed)
Occupational Therapy Evaluation Patient Details Name: Randall Flores. MRN: [id number] DOB: [birth year] Today's Date: 04/08/2018    History of Present Illness Pt s/p R TKR and with hx of DDD and multiple back surgery   Clinical Impression   This 63 year old man was admitted for the above sx. All education was completed. No further OT is needed at this time     Follow Up Recommendations  Supervision/Assistance - 24 hour    Equipment Recommendations  None recommended by OT    Recommendations for Other Services       Precautions / Restrictions Precautions Precautions: Knee;Fall Required Braces or Orthoses: Knee Immobilizer - Right Knee Immobilizer - Right: Discontinue once straight leg raise with < 10 degree lag Restrictions Weight Bearing Restrictions: No Other Position/Activity Restrictions: WBAT      Mobility Bed Mobility Overal bed mobility: Needs Assistance Bed Mobility: Sit to Supine     Supine to sit: Min assist    General bed mobility comments: cues for sequence and use of L LE to self assist; physical assist to manage R LE   Transfers Overall transfer level: Needs assistance Equipment used: Rolling walker (2 wheeled) Transfers: Sit to/from Stand Sit to Stand: Min guard         General transfer comment: cues for LE management and use of UEs to self assist    Balance Overall balance assessment: Mild deficits observed, not formally tested                                         ADL either performed or assessed with clinical judgement   ADL Overall ADL's : Needs assistance/impaired     Grooming: Oral care;Standing;Min guard                   Toilet Transfer: Minimal assistance;Ambulation;BSC;RW   Toileting- Clothing Manipulation and Hygiene: Minimal assistance;Sit to/from stand         General ADL Comments: pt can perform UB adls with set up. Wife will assist with LB adls: pt needs mod to max A.  Demonstrated  donning ted hose and KI.  Wife is on disability but states she can help with these things. Recommended pt sponge bathe if they don't want to get a tub bench.  Pt/wife are fine with this     Vision         Perception   0-10  Praxis  7 R knee Monitored, premedicated    Pertinent Vitals/Pain      Hand Dominance     Extremity/Trunk Assessment Upper Extremity Assessment Upper Extremity Assessment: Overall WFL for tasks assessed           Communication Communication Communication: No difficulties   Cognition Arousal/Alertness: Awake/alert Behavior During Therapy: WFL for tasks assessed/performed Overall Cognitive Status: Within Functional Limits for tasks assessed                                     General Comments       Exercises    Shoulder Instructions      Home Living Family/patient expects to be discharged to:: Private residence Living Arrangements: Spouse/significant other Available Help at Discharge: Family               Bathroom Shower/Tub: Tub/shower unit   [employer]  Toilet: Handicapped height     Home Equipment: Bedside commode;Toilet riser;Shower seat          Prior Functioning/Environment Level of Independence: Independent                 OT Problem List:        OT Treatment/Interventions:      OT Goals(Current goals can be found in the care plan section) Acute Rehab OT Goals Patient Stated Goal: Regain IND OT Goal Formulation: All assessment and education complete, DC therapy  OT Frequency:     Barriers to D/C:            Co-evaluation              AM-PAC OT "6 Clicks" Daily Activity     Outcome Measure Help from another person eating meals?: None Help from another person taking care of personal grooming?: A Little Help from another person toileting, which includes using toliet, bedpan, or urinal?: A Little Help from another person bathing (including washing, rinsing, drying)?: A Lot Help from  another person to put on and taking off regular upper body clothing?: A Little Help from another person to put on and taking off regular lower body clothing?: A Lot 6 Click Score: 17   End of Session    Activity Tolerance: Patient tolerated treatment well Patient left: (handed over to PT)  OT Visit Diagnosis: Pain Pain - Right/Left: Right Pain - part of body: Knee                Time: 4982-6415 OT Time Calculation (min): 35 min Charges:  OT General Charges $OT Visit: 1 Visit OT Evaluation $OT Eval Low Complexity: 1 Low OT Treatments $Self Care/Home Management : 8-22 mins  Lesle Chris, OTR/L Acute Rehabilitation Services [phone] [facility] pager [phone] office 04/08/2018  [place] 04/08/2018, 4:00 PM

## 2018-04-08 NOTE — Plan of Care (Signed)
  Problem: Pain Managment: Goal: General experience of comfort will improve Outcome: Progressing   Problem: Pain Management: Goal: Pain level will decrease with appropriate interventions Outcome: Progressing   Problem: Clinical Measurements: Goal: Will remain free from infection Outcome: Progressing   Problem: Clinical Measurements: Goal: Cardiovascular complication will be avoided Outcome: Progressing   Problem: Nutrition: Goal: Adequate nutrition will be maintained Outcome: Progressing

## 2018-04-08 NOTE — TOC Progression Note (Signed)
Transition of Care (TOC) - Progression Note    Patient Details  Name: Ameet Sandy. MRN: [id number] Date of Birth: April 19, 1955  Transition of Care [facility]) CM/SW Contact  Norina Buzzard, RN Phone Number: 04/08/2018, 4:56 PM  Clinical Narrative: Awaiting for call from [employer]. Contacted [facility] and spoke to [place]. She reports that they are waiting to secure a provider that would accept the pt. Will continue to f/u. Met with pt and wife again and informed them of above.    Expected Discharge Plan: [place] Barriers to Discharge: Insurance Authorization  Expected Discharge Plan and Services Expected Discharge Plan: [place] Discharge Planning Services: CM Consult                         HH Arranged: PT [facility] Agency: Other - See comment   Social Determinants of Health (SDOH) Interventions    Readmission Risk Interventions 30 Day Unplanned Readmission Risk Score     Admission (Current) from 04/07/2018 in [facility]  30 Day Unplanned Readmission Risk Score (%)  7 Filed at 04/08/2018 1600     This score is the patient's risk of an unplanned readmission within 30 days of being discharged (0 -100%). The score is based on dignosis, age, lab data, medications, orders, and past utilization.   Low:  0-14.9   Medium: 15-21.9   High: 22-29.9   Extreme: 30 and above       No flowsheet data found.

## 2018-04-08 NOTE — Progress Notes (Addendum)
Patient ID: Randall Scheidt., male   DOB: [birth year], 63 y.o.   MRN: [id number]     Subjective:  Patient reports pain as mild to moderate.  Patient in bed and denies any CP or SOB  Objective:   VITALS:   Vitals:   04/07/18 2214 04/08/18 0104 04/08/18 0505 04/08/18 1002  BP: 135/75 (!) 144/80 (!) 148/83 (!) 154/88  Pulse: 77 72 72 72  Resp: 16 16 16 16   Temp: 98.1 F (36.7 C) 98 F (36.7 C) 98.1 F (36.7 C) 99 F (37.2 C)  TempSrc: Oral Oral Oral Axillary  SpO2: 96% 96% 100% 98%  Weight:      Height:        ABD soft Sensation intact distally Dorsiflexion/Plantar flexion intact Incision: dressing C/D/I and no drainage   Lab Results  Component Value Date   WBC 12.4 (H) 04/08/2018   HGB 11.0 (L) 04/08/2018   HCT 34.6 (L) 04/08/2018   MCV 92.3 04/08/2018   PLT 225 04/08/2018   BMET    Component Value Date/Time   NA 136 04/08/2018 0455   NA 136 06/27/2013 0958   K 4.1 04/08/2018 0455   K 4.3 06/27/2013 0958   CL 104 04/08/2018 0455   CL 105 06/27/2013 0958   CO2 24 04/08/2018 0455   CO2 27 06/27/2013 0958   GLUCOSE 134 (H) 04/08/2018 0455   GLUCOSE 129 (H) 06/27/2013 0958   BUN 16 04/08/2018 0455   BUN 15 06/27/2013 0958   CREATININE 0.90 04/08/2018 0455   CREATININE 1.05 06/27/2013 0958   CALCIUM 8.5 (L) 04/08/2018 0455   CALCIUM 8.7 06/27/2013 0958   GFRNONAA >60 04/08/2018 0455   GFRNONAA >60 06/27/2013 0958   GFRAA >60 04/08/2018 0455   GFRAA >60 06/27/2013 0958     Assessment/Plan: 1 Day Post-Op   Active Problems:   Primary localized osteoarthritis of right knee   Advance diet Up with therapy Plan for discharge tomorrow Continue ace wrap  Continue pain regimen WBAT Dry dressing PRN    Anticipated LOS equal to or greater than 2 midnights due to - Age 68 and older with one or more of the following:  - Obesity  - Expected need for hospital services (PT, OT, Nursing) required for safe  discharge  - Anticipated need for postoperative  skilled nursing care or inpatient rehab  - Active co-morbidities: Chronic pain requiring opiods OR   - Unanticipated findings during/Post Surgery: None  - Patient is a high risk of re-admission due to: None     Lunette Stands 04/08/2018, 1:23 PM  Discussed and agree with above.   Marchia Bond, MD Cell [phone]

## 2018-04-08 NOTE — TOC Initial Note (Signed)
Transition of Care (TOC) - Initial/Assessment Note    Patient Details  Name: Randall Flores. MRN: [id number] Date of Birth: 02-21-1955  Transition of Care [facility]) CM/SW Contact:    Norina Buzzard, RN Phone Number: 04/08/2018, 4:47 PM  Clinical Narrative: 63 yo M s/p R TKA. Received referral to assist with [facility] PT and DME. Met with pt and wife. He plans to return home with the support of his wife. He has a RW and 3-in-1 BSC. Wife reports that the CPM has been delivered. Discussed preference for a [facility] agency. Wife stated that she doesn't have a preference. [name] insurance is [employer]. Contacted [employer] at [phone] and spoke to the receptionist. She requested the H & P, demographic and the order for the [facility]. Fax # [phone]. Faxed documentation and received confirmation that the fax went through. Receptionist reports that she submitted the request and someone will f/u with the approval. Will continue to f/u to assist with the D/C plan.           Expected Discharge Plan: [place] [employer] Barriers to Discharge: Insurance Authorization   Patient Goals and CMS Choice Patient states their goals for this hospitalization and ongoing recovery are:: To get better.   Choice offered to / list presented to : Patient, Spouse  Expected Discharge Plan and Services Expected Discharge Plan: [place] Discharge Planning Services: CM Consult                         HH Arranged: PT [facility] Agency: Other - See comment  Prior Living Arrangements/Services   Lives with:: Spouse Patient language and need for interpreter reviewed:: No Do you feel safe going back to the place where you live?: Yes      Need for Family Participation in Patient Care: Yes (Comment) Care giver support system in place?: Yes (comment)      Activities of Daily Living Home Assistive Devices/Equipment: Eyeglasses, Dentures (specify type), Cane (specify quad or straight), Crutches,  Walker (specify type), Shower chair with back ADL Screening (condition at time of admission) Patient's cognitive ability adequate to safely complete daily activities?: Yes Is the patient deaf or have difficulty hearing?: No Does the patient have difficulty seeing, even when wearing glasses/contacts?: No Does the patient have difficulty concentrating, remembering, or making decisions?: No Patient able to express need for assistance with ADLs?: Yes Does the patient have difficulty dressing or bathing?: No Independently performs ADLs?: Yes (appropriate for developmental age) Does the patient have difficulty walking or climbing stairs?: Yes Weakness of Legs: None Weakness of Arms/Hands: None  Permission Sought/Granted Permission sought to share information with : Case Manager Permission granted to share information with : Yes, Verbal Permission Granted              Emotional Assessment Appearance:: Well-Groomed Attitude/Demeanor/Rapport: Engaged Affect (typically observed): Accepting, Appropriate Orientation: : Oriented to Self, Oriented to Place, Oriented to  Time, Oriented to Situation      Admission diagnosis:  OA RIGHT KNEE Patient Active Problem List   Diagnosis Date Noted  . Primary localized osteoarthritis of right knee 04/07/2018  . Colon cancer screening   . Pseudopolyposis of colon without complication ([place])   . Abdominal pain, right upper quadrant 02/06/2015  . Right lower quadrant abdominal pain 02/06/2015   PCP:  [name], MD Pharmacy:   [facility] ([facility] [place] - [place]  [address]) [place] [address] Phone: [phone] Fax: [phone]     Social Determinants of Health (SDOH) Interventions    Readmission Risk Interventions 30 Day Unplanned Readmission Risk Score     Admission (Current) from 04/07/2018 in [facility]  30 Day Unplanned Readmission Risk Score (%)   7 Filed at 04/08/2018 1600     This score is the patient's risk of an unplanned readmission within 30 days of being discharged (0 -100%). The score is based on dignosis, age, lab data, medications, orders, and past utilization.   Low:  0-14.9   Medium: 15-21.9   High: 22-29.9   Extreme: 30 and above       No flowsheet data found.

## 2018-04-08 NOTE — Progress Notes (Signed)
Physical Therapy Treatment Patient Details Name: Randall Flores. MRN: [id number] DOB: Jun 06, 1955 Today's Date: 04/08/2018    History of Present Illness Pt s/p R TKR and with hx of DDD and multiple back surgery    PT Comments    Pt very motivated and progressing with mobility but requiring increased time and ltd by increasing pain with activity    Follow Up Recommendations        Equipment Recommendations  None recommended by PT    Recommendations for Other Services       Precautions / Restrictions Precautions Precautions: Knee;Fall Required Braces or Orthoses: Knee Immobilizer - Right Knee Immobilizer - Right: Discontinue once straight leg raise with < 10 degree lag Restrictions Weight Bearing Restrictions: No Other Position/Activity Restrictions: WBAT    Mobility  Bed Mobility Overal bed mobility: Needs Assistance Bed Mobility: Supine to Sit     Supine to sit: Min assist     General bed mobility comments: cues for sequence and use of L LE to self assist; physical assist to manage R LE and to bring trunk to upright  Transfers Overall transfer level: Needs assistance Equipment used: Rolling walker (2 wheeled) Transfers: Sit to/from Stand Sit to Stand: Min assist         General transfer comment: cues for LE management and use of UEs to self assist  Ambulation/Gait Ambulation/Gait assistance: Min assist Gait Distance (Feet): 100 Feet Assistive device: Rolling walker (2 wheeled) Gait Pattern/deviations: Step-to pattern;Decreased step length - right;Decreased step length - left;Shuffle;Trunk flexed Gait velocity: decr   General Gait Details: Increased time with cues for sequence, posture and position from [employer]             Wheelchair Mobility    Modified Rankin (Stroke Patients Only)       Balance Overall balance assessment: Mild deficits observed, not formally tested                                           Cognition Arousal/Alertness: Awake/alert Behavior During Therapy: WFL for tasks assessed/performed Overall Cognitive Status: Within Functional Limits for tasks assessed                                        Exercises Total Joint Exercises Ankle Circles/Pumps: AROM;Both;15 reps;Supine Quad Sets: AROM;Both;10 reps;Supine Heel Slides: AAROM;Right;15 reps;Supine Straight Leg Raises: AAROM;AROM;Right;10 reps;Supine Goniometric ROM: AAROM -8- 40 R knee    General Comments        Pertinent Vitals/Pain Pain Assessment: 0-10 Pain Score: 6  Pain Location: R knee Pain Descriptors / Indicators: Aching;Sore Pain Intervention(s): Limited activity within patient's tolerance;Monitored during session;Premedicated before session;Ice applied    Home Living                      Prior Function            PT Goals (current goals can now be found in the care plan section) Acute Rehab PT Goals Patient Stated Goal: Regain IND PT Goal Formulation: With patient Time For Goal Achievement: 04/14/18 Potential to Achieve Goals: Good Progress towards PT goals: Progressing toward goals    Frequency    7X/week      PT Plan Current plan remains appropriate    Co-evaluation  AM-PAC PT "6 Clicks" Mobility   Outcome Measure  Help needed turning from your back to your side while in a flat bed without using bedrails?: A Little Help needed moving from lying on your back to sitting on the side of a flat bed without using bedrails?: A Little Help needed moving to and from a bed to a chair (including a wheelchair)?: A Little Help needed standing up from a chair using your arms (e.g., wheelchair or bedside chair)?: A Little Help needed to walk in hospital room?: A Little Help needed climbing 3-5 steps with a railing? : A Lot 6 Click Score: 17    End of Session Equipment Utilized During Treatment: Gait belt       PT Visit Diagnosis: Difficulty in  walking, not elsewhere classified (R26.2)     Time: 5638-7564 PT Time Calculation (min) (ACUTE ONLY): 43 min  Charges:  $Gait Training: 23-37 mins $Therapeutic Exercise: 8-22 mins                     [place] Pager [phone] Office [phone]    Randall Flores 04/08/2018, 1:22 PM

## 2018-04-09 LAB — CBC
HCT: 35.7 % — ABNORMAL LOW (ref 39.0–52.0)
Hemoglobin: 11.4 g/dL — ABNORMAL LOW (ref 13.0–17.0)
MCH: 29.5 pg (ref 26.0–34.0)
MCHC: 31.9 g/dL (ref 30.0–36.0)
MCV: 92.2 fL (ref 80.0–100.0)
Platelets: 238 10*3/uL (ref 150–400)
RBC: 3.87 MIL/uL — ABNORMAL LOW (ref 4.22–5.81)
RDW: 12.7 % (ref 11.5–15.5)
WBC: 9.8 10*3/uL (ref 4.0–10.5)
nRBC: 0 % (ref 0.0–0.2)

## 2018-04-09 NOTE — Progress Notes (Addendum)
Patient ID: Randall Liddy., male   DOB: 02-Sep-1955, 63 y.o.   MRN: [id number]     Subjective:  Patient reports pain as mild.  Patient in the chair and in no acute distress.  Denies any CP or SOB  Objective:   VITALS:   Vitals:   04/08/18 1002 04/08/18 1359 04/08/18 2136 04/09/18 0609  BP: (!) 154/88 (!) 157/86 (!) 151/81 (!) 162/86  Pulse: 72 75 78 84  Resp: 16  18 18   Temp: 99 F (37.2 C) 98.3 F (36.8 C) 98.3 F (36.8 C) 98.5 F (36.9 C)  TempSrc: Axillary Oral Oral Oral  SpO2: 98% 99% 98% 97%  Weight:      Height:        ABD soft Sensation intact distally Dorsiflexion/Plantar flexion intact Incision: dressing C/D/I and no drainage New dressing applied  Lab Results  Component Value Date   WBC 9.8 04/09/2018   HGB 11.4 (L) 04/09/2018   HCT 35.7 (L) 04/09/2018   MCV 92.2 04/09/2018   PLT 238 04/09/2018   BMET    Component Value Date/Time   NA 136 04/08/2018 0455   NA 136 06/27/2013 0958   K 4.1 04/08/2018 0455   K 4.3 06/27/2013 0958   CL 104 04/08/2018 0455   CL 105 06/27/2013 0958   CO2 24 04/08/2018 0455   CO2 27 06/27/2013 0958   GLUCOSE 134 (H) 04/08/2018 0455   GLUCOSE 129 (H) 06/27/2013 0958   BUN 16 04/08/2018 0455   BUN 15 06/27/2013 0958   CREATININE 0.90 04/08/2018 0455   CREATININE 1.05 06/27/2013 0958   CALCIUM 8.5 (L) 04/08/2018 0455   CALCIUM 8.7 06/27/2013 0958   GFRNONAA >60 04/08/2018 0455   GFRNONAA >60 06/27/2013 0958   GFRAA >60 04/08/2018 0455   GFRAA >60 06/27/2013 0958     Assessment/Plan: 2 Days Post-Op   Active Problems:   Primary localized osteoarthritis of right knee   Advance diet Up with therapy DC home after PT WBAT Dry dressing PRN Follow up with Dr Lou Miner    Lunette Stands 04/09/2018, 8:40 AM  Discussed and agree with above.   Marchia Bond, MD Cell [phone]

## 2018-04-09 NOTE — Plan of Care (Signed)
  Problem: Pain Managment: ?Goal: General experience of comfort will improve ?Outcome: Progressing ?  ?Problem: Pain Management: ?Goal: Pain level will decrease with appropriate interventions ?Outcome: Progressing ?  ?

## 2018-04-09 NOTE — TOC Transition Note (Signed)
Transition of Care [facility]) - CM/SW Discharge Note   Patient Details  Name: Randall Flores. MRN: [id number] Date of Birth: 24-Mar-1955  Transition of Care [facility]) CM/SW Contact:  Dessa Phi, RN Phone Number: 04/09/2018, 12:22 PM   Clinical Narrative:Awaiting insurance auth from [employer] for HHPT.TC [employer]-they will provide a HHC agency for patient-they will contact patient on own for available agencies. Patient has all dme..Nsg updated.   No further CM needs.    Final next level of care: [place] Barriers to Discharge: Insurance Authorization   Patient Goals and CMS Choice Patient states their goals for this hospitalization and ongoing recovery are:: To get better.   Choice offered to / list presented to : Patient, Spouse  Discharge Placement                       Discharge Plan and Services Discharge Planning Services: CM Consult                HH Arranged: PT [facility] Agency: Other - See comment   Social Determinants of Health (SDOH) Interventions     Readmission Risk Interventions No flowsheet data found.

## 2018-04-09 NOTE — Progress Notes (Signed)
Physical Therapy Treatment Patient Details Name: Randall Flores. MRN: [id number] DOB: 11-08-1955 Today's Date: 04/09/2018    History of Present Illness Pt s/p R TKR and with hx of DDD and multiple back surgery    PT Comments    Pt continues cooperative but and slowly improving with mobility but requires increased time for all tasks.  This date, pt ambulated short distance in hall, negotiated stair and reviewed don/doff KI.  Pt requests rest prior to attempting therex.   Follow Up Recommendations  Follow surgeon's recommendation for DC plan and follow-up therapies     Equipment Recommendations  None recommended by PT    Recommendations for Other Services       Precautions / Restrictions Precautions Precautions: Knee;Fall Required Braces or Orthoses: Knee Immobilizer - Right Knee Immobilizer - Right: Discontinue once straight leg raise with < 10 degree lag Restrictions Weight Bearing Restrictions: No Other Position/Activity Restrictions: WBAT    Mobility  Bed Mobility               General bed mobility comments: Pt up in chair and requests back to same  Transfers Overall transfer level: Needs assistance Equipment used: Rolling walker (2 wheeled) Transfers: Sit to/from Stand Sit to Stand: Min guard         General transfer comment: cues for LE management and use of UEs to self assist  Ambulation/Gait Ambulation/Gait assistance: Min guard;Supervision Gait Distance (Feet): 60 Feet Assistive device: Rolling walker (2 wheeled) Gait Pattern/deviations: Step-to pattern;Decreased step length - right;Decreased step length - left;Shuffle;Trunk flexed Gait velocity: decr   General Gait Details: Increased time with cues for sequence, posture and position from RW   Stairs Stairs: Yes Stairs assistance: Min assist Stair Management: No rails;Backwards;With walker;Step to pattern Number of Stairs: 2 General stair comments: single step twice bkwd with cues for  sequence and foot/RW placement   Wheelchair Mobility    Modified Rankin (Stroke Patients Only)       Balance Overall balance assessment: Mild deficits observed, not formally tested                                          Cognition Arousal/Alertness: Awake/alert Behavior During Therapy: WFL for tasks assessed/performed Overall Cognitive Status: Within Functional Limits for tasks assessed                                        Exercises      General Comments        Pertinent Vitals/Pain Pain Assessment: 0-10 Pain Score: 7  Pain Location: R knee Pain Descriptors / Indicators: Aching;Sore Pain Intervention(s): Limited activity within patient's tolerance;Monitored during session;Premedicated before session;Ice applied    Home Living                      Prior Function            PT Goals (current goals can now be found in the care plan section) Acute Rehab PT Goals Patient Stated Goal: Regain IND PT Goal Formulation: With patient Time For Goal Achievement: 04/14/18 Potential to Achieve Goals: Good Progress towards PT goals: Progressing toward goals    Frequency    7X/week      PT Plan Current plan remains appropriate    Co-evaluation  AM-PAC PT "6 Clicks" Mobility   Outcome Measure  Help needed turning from your back to your side while in a flat bed without using bedrails?: A Little Help needed moving from lying on your back to sitting on the side of a flat bed without using bedrails?: A Little Help needed moving to and from a bed to a chair (including a wheelchair)?: A Little Help needed standing up from a chair using your arms (e.g., wheelchair or bedside chair)?: A Little Help needed to walk in hospital room?: A Little Help needed climbing 3-5 steps with a railing? : A Little 6 Click Score: 18    End of Session Equipment Utilized During Treatment: Gait belt Activity Tolerance: Patient  tolerated treatment well;Patient limited by fatigue;Patient limited by pain Patient left: in chair;with call bell/phone within reach Nurse Communication: Mobility status PT Visit Diagnosis: Difficulty in walking, not elsewhere classified (R26.2)     Time: 2563-8937 PT Time Calculation (min) (ACUTE ONLY): 32 min  Charges:  $Gait Training: 8-22 mins $Therapeutic Activity: 8-22 mins                     [place] Pager [phone] Office [phone]    Yotam Rhine 04/09/2018, 12:40 PM

## 2018-04-09 NOTE — Progress Notes (Signed)
Pt stable at time of d/c. No needs and pt has all home equipment. D/c instructions given without complication. Family at bedside at time of d/c instructions.

## 2018-04-09 NOTE — Discharge Summary (Signed)
Physician Discharge Summary  Patient ID: Randall Flores. MRN: [id number] DOB/AGE: 63-Mar-1957 62 y.o.  Admit date: 04/07/2018 Discharge date: 04/09/2018  Admission Diagnoses:  Right knee primary localized osteoarthritis   Discharge Diagnoses:  Active Problems:   Primary localized osteoarthritis of right knee   Past Medical History:  Diagnosis Date  . Anxiety   . BPH with obstruction/lower urinary tract symptoms   . Colon polyp    Multiple  . Complication of anesthesia    wakes up during anesthesia  . DDD (degenerative disc disease), lumbar   . Diverticulitis   . Diverticulosis   . Elevated PSA   . Fatty liver 01/22/2015   Mild, noted on [place] Abd  . Heart murmur   . 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 Hepatic hemangioma 02/03/2015   Small, noted on MRI Abd  . History of colitis   . History of kidney stones   . Hypercholesteremia   . Hypertension   . Hypothyroidism   . Major depression   . Moderate aortic stenosis 05/24/2017   Noted on ECHO  . Near syncope 04/25/2017   due to stress  . OA (osteoarthritis)    knees, back, hands  . Pre-diabetes   . PVC (premature ventricular contraction)   . Stroke [facility]) 2015   TIA/right side affected, right side has improved  . Wears dentures    partial upper, front    Surgeries: Procedure(s): TOTAL KNEE ARTHROPLASTY on 04/07/2018   Consultants (if any):   Discharged Condition: Improved  Hospital Course: Randall Flores. is an 63 y.o. male who was admitted 04/07/2018 with a diagnosis of right knee oa and went to the operating room on 04/07/2018 and underwent the above named procedures.    He was given perioperative antibiotics:  Anti-infectives (From admission, onward)   Start     Dose/Rate Route Frequency Ordered Stop   04/07/18 1600  ceFAZolin (ANCEF) IVPB 2g/100 mL premix     2 g 200 mL/hr over 30 Minutes Intravenous Every 6 hours 04/07/18 1320 04/07/18 2226   04/07/18 0630  ceFAZolin (ANCEF) IVPB  2g/100 mL premix     2 g 200 mL/hr over 30 Minutes Intravenous On call to O.R. 04/07/18 5277 04/07/18 0749    .  He was given sequential compression devices, early ambulation, and aspirin for DVT prophylaxis.  He benefited maximally from the hospital stay and there were no complications.    Recent vital signs:  Vitals:   04/08/18 2136 04/09/18 0609  BP: (!) 151/81 (!) 162/86  Pulse: 78 84  Resp: 18 18  Temp: 98.3 F (36.8 C) 98.5 F (36.9 C)  SpO2: 98% 97%    Recent laboratory studies:  Lab Results  Component Value Date   HGB 11.4 (L) 04/09/2018   HGB 11.0 (L) 04/08/2018   HGB 13.0 03/29/2018   Lab Results  Component Value Date   WBC 9.8 04/09/2018   PLT 238 04/09/2018   Lab Results  Component Value Date   INR 0.9 03/29/2018   Lab Results  Component Value Date   NA 136 04/08/2018   K 4.1 04/08/2018   CL 104 04/08/2018   CO2 24 04/08/2018   BUN 16 04/08/2018   CREATININE 0.90 04/08/2018   GLUCOSE 134 (H) 04/08/2018    Discharge Medications:   Allergies as of 04/09/2018      Reactions   Cod [fish Allergy]    Codeine Nausea And Vomiting   Ivp  Dye [iodinated Diagnostic Agents] Nausea And Vomiting   Also felt very hot/flushed   Pravastatin    Other reaction(s): Muscle Pain   Shellfish Allergy Nausea And Vomiting      Medication List    TAKE these medications   acetaminophen 500 MG tablet Commonly known as:  TYLENOL Take 1,000 mg by mouth every 8 (eight) hours as needed (pain).   aspirin EC 81 MG tablet Take 1 tablet (81 mg total) by mouth 2 (two) times daily for 14 days. TO PREVENT BLOOD CLOTS   citalopram 20 MG tablet Commonly known as:  CELEXA Take 20 mg by mouth daily.   hydrocortisone cream 0.5 % Apply 1 application topically daily as needed for itching.   levothyroxine 150 MCG tablet Commonly known as:  SYNTHROID, LEVOTHROID Take 150 mcg by mouth daily before breakfast.   lisinopril 10 MG tablet Commonly known as:   PRINIVIL,ZESTRIL Take 10 mg by mouth daily.   methocarbamol 500 MG tablet Commonly known as:  Robaxin Take 1 tablet (500 mg total) by mouth every 6 (six) hours as needed for muscle spasms.   multivitamin capsule Take 1 capsule by mouth daily.   oxyCODONE 5 MG immediate release tablet Commonly known as:  Oxy IR/ROXICODONE Take one tab po q4-6hrs prn pain, may need 1-2 first couple weeks   rosuvastatin 10 MG tablet Commonly known as:  CRESTOR Take 10 mg by mouth daily.   SALONPAS PAIN RELIEF PATCH EX Apply 1 patch topically daily as needed (pain).   sildenafil 20 MG tablet Commonly known as:  Revatio Take 1 tablet (20 mg total) by mouth as needed. Take 1-5 tabs as needed prior to intercourse   tamsulosin 0.4 MG Caps capsule Commonly known as:  Flomax Take 1 capsule (0.4 mg total) by mouth daily.   vitamin C 500 MG tablet Commonly known as:  ASCORBIC ACID Take 500 mg by mouth daily.            Durable Medical Equipment  (From admission, onward)         Start     Ordered   04/07/18 1321  DME 3 n 1  Once     04/07/18 1320          Diagnostic Studies: No results found.  Disposition: Discharge disposition: 01-Home or Self Care         Follow-up Information    Earlie Server, MD. Schedule an appointment as soon as possible for a visit in 2 weeks.   Specialty:  Orthopedic Surgery Contact information: [address] [place] [address] [phone]            Signed: Johnny Bridge 04/09/2018, 11:27 AM

## 2018-04-09 NOTE — Progress Notes (Signed)
Physical Therapy Treatment Patient Details Name: Randall Flores. MRN: [id number] DOB: 07-15-1955 Today's Date: 04/09/2018    History of Present Illness Pt s/p R TKR and with hx of DDD and multiple back surgery    PT Comments    Pt performed home therex program with assist and multiple rests required 2* pain/fatigue.  Written instruction provided.   Follow Up Recommendations  Follow surgeon's recommendation for DC plan and follow-up therapies     Equipment Recommendations  None recommended by PT    Recommendations for Other Services       Precautions / Restrictions Precautions Precautions: Knee;Fall Required Braces or Orthoses: Knee Immobilizer - Right Knee Immobilizer - Right: Discontinue once straight leg raise with < 10 degree lag Restrictions Weight Bearing Restrictions: No Other Position/Activity Restrictions: WBAT    Mobility  Bed Mobility               General bed mobility comments: Pt up in chair and requests back to same  Transfers Overall transfer level: Needs assistance Equipment used: Rolling walker (2 wheeled) Transfers: Sit to/from Stand Sit to Stand: Min guard         General transfer comment: cues for LE management and use of UEs to self assist  Ambulation/Gait Ambulation/Gait assistance: Min guard;Supervision Gait Distance (Feet): 60 Feet Assistive device: Rolling walker (2 wheeled) Gait Pattern/deviations: Step-to pattern;Decreased step length - right;Decreased step length - left;Shuffle;Trunk flexed Gait velocity: decr   General Gait Details: Increased time with cues for sequence, posture and position from RW   Stairs Stairs: Yes Stairs assistance: Min assist Stair Management: No rails;Backwards;With walker;Step to pattern Number of Stairs: 2 General stair comments: single step twice bkwd with cues for sequence and foot/RW placement   Wheelchair Mobility    Modified Rankin (Stroke Patients Only)       Balance Overall  balance assessment: Mild deficits observed, not formally tested                                          Cognition Arousal/Alertness: Awake/alert Behavior During Therapy: WFL for tasks assessed/performed Overall Cognitive Status: Within Functional Limits for tasks assessed                                        Exercises Total Joint Exercises Ankle Circles/Pumps: AROM;Both;15 reps;Supine Quad Sets: AROM;Both;10 reps;Supine Heel Slides: AAROM;Right;15 reps;Supine Straight Leg Raises: Right;10 reps;Supine;AAROM Goniometric ROM: AAROM R knee -8 - 40    General Comments        Pertinent Vitals/Pain Pain Assessment: 0-10 Pain Score: 7  Pain Location: R knee Pain Descriptors / Indicators: Aching;Sore Pain Intervention(s): Limited activity within patient's tolerance;Monitored during session;Premedicated before session;Ice applied    Home Living                      Prior Function            PT Goals (current goals can now be found in the care plan section) Acute Rehab PT Goals Patient Stated Goal: Regain IND PT Goal Formulation: With patient Time For Goal Achievement: 04/14/18 Potential to Achieve Goals: Good Progress towards PT goals: Progressing toward goals    Frequency    7X/week      PT Plan Current plan remains appropriate  Co-evaluation              AM-PAC PT "6 Clicks" Mobility   Outcome Measure  Help needed turning from your back to your side while in a flat bed without using bedrails?: A Little Help needed moving from lying on your back to sitting on the side of a flat bed without using bedrails?: A Little Help needed moving to and from a bed to a chair (including a wheelchair)?: A Little Help needed standing up from a chair using your arms (e.g., wheelchair or bedside chair)?: A Little Help needed to walk in hospital room?: A Little Help needed climbing 3-5 steps with a railing? : A Little 6 Click  Score: 18    End of Session Equipment Utilized During Treatment: Gait belt Activity Tolerance: Patient tolerated treatment well;Patient limited by fatigue;Patient limited by pain Patient left: in chair;with call bell/phone within reach Nurse Communication: Mobility status PT Visit Diagnosis: Difficulty in walking, not elsewhere classified (R26.2)     Time: 9753-0051 PT Time Calculation (min) (ACUTE ONLY): 23 min  Charges:  $Gait Training: 8-22 mins $Therapeutic Exercise: 8-22 mins $Therapeutic Activity: 8-22 mins                     [place] Pager [phone] Office [phone]    Taeja Debellis 04/09/2018, 12:46 PM

## 2018-04-11 ENCOUNTER — Encounter (HOSPITAL_COMMUNITY): Payer: Self-pay | Admitting: Orthopedic Surgery

## 2018-07-10 ENCOUNTER — Other Ambulatory Visit: Payer: Self-pay

## 2018-07-10 ENCOUNTER — Other Ambulatory Visit: Payer: BLUE CROSS/BLUE SHIELD

## 2018-07-10 ENCOUNTER — Other Ambulatory Visit: Payer: PRIVATE HEALTH INSURANCE

## 2018-07-10 DIAGNOSIS — R972 Elevated prostate specific antigen [PSA]: Secondary | ICD-10-CM

## 2018-07-11 LAB — PSA: Prostate Specific Ag, Serum: 6.6 ng/mL — ABNORMAL HIGH (ref 0.0–4.0)

## 2018-07-13 ENCOUNTER — Telehealth: Payer: Self-pay | Admitting: Urology

## 2018-07-13 NOTE — Telephone Encounter (Signed)
Ok to give results?

## 2018-07-13 NOTE — Telephone Encounter (Signed)
Pt called office asking for PSA results.

## 2018-07-13 NOTE — Telephone Encounter (Signed)
Okay to release results but I would like him to schedule an appointment, either virtual or in person to discuss results.  Remains markedly elevated for his age and rising which is concerning.   Hollice Espy, MD

## 2018-07-14 NOTE — Telephone Encounter (Signed)
Results were given to the patient and he said he would call back later today to schedule the follow up with Dr. Erlene Quan. See the message below   [place]

## 2018-08-08 ENCOUNTER — Ambulatory Visit: Payer: BLUE CROSS/BLUE SHIELD | Admitting: Podiatry

## 2018-08-18 ENCOUNTER — Telehealth (INDEPENDENT_AMBULATORY_CARE_PROVIDER_SITE_OTHER): Payer: PRIVATE HEALTH INSURANCE | Admitting: Urology

## 2018-08-18 ENCOUNTER — Other Ambulatory Visit: Payer: Self-pay

## 2018-08-18 DIAGNOSIS — N5203 Combined arterial insufficiency and corporo-venous occlusive erectile dysfunction: Secondary | ICD-10-CM

## 2018-08-18 DIAGNOSIS — N401 Enlarged prostate with lower urinary tract symptoms: Secondary | ICD-10-CM

## 2018-08-18 DIAGNOSIS — R972 Elevated prostate specific antigen [PSA]: Secondary | ICD-10-CM | POA: Diagnosis not present

## 2018-08-18 DIAGNOSIS — N138 Other obstructive and reflux uropathy: Secondary | ICD-10-CM

## 2018-08-18 NOTE — Progress Notes (Signed)
Virtual Visit via Video Note  I connected with Randall Flores. on 08/18/18 at  2:45 PM EDT by a video enabled telemedicine application and verified that I am speaking with the correct person using two identifiers.  Location: Patient: home Provider: office   I discussed the limitations of evaluation and management by telemedicine and the availability of in person appointments. The patient expressed understanding and agreed to proceed.  History of Present Illness: 63 yo M  personal history of elevated PSA and severe urinary symptoms who returns today via virtual visit for follow-up.  He reports that he quit drinking beer and his urinary symptoms almost completely subsided.  He still taking Flomax as this is also been helpful.  He only voids a couple times a day and only gets up once at night to urinate.  He feels like he is emptying his bladder very well at this point.  He believes that drinking too much alcohol was a huge contributing factor to his symptoms.  He is more concerned than previously about his elevated PSA.  He continues to fluctuate, PSA trend as below.  He reports that his sister was diagnosed with a bowel obstruction secondary to malignancy and died shortly thereafter this spring.  He also mentions today that he has a history of urethral stricture.  He reports that when he was a teenager, this had to be dilated.  He has a duplicated left collecting system.  He continues to complain of erectile dysfunction.  He was prescribed Viagra/sildenafil last visit which he said was not effective whatsoever.  Past medical history is significant for history of stroke for which he takes Plavix.  He was able to stop this for knee replacement without difficulty.  His new primary care is from the [facility], [place].   PSA trend: 12/2017 6.7 09/2017 4.6 07/2017 6.06 01/09/2018 6.7 03/06/2018  5.3 07/10/2018  6.6  Observations/Objective: Pleasant, asking good  questions  Assessment and Plan:  1. Elevated PSA PSA fairly stable over the past year, however more elevated than ought to be for his age group.  He does have some fluctuation which may be indicative of an inflammatory component.  He has become more concerned recently due to cancer diagnosis and his sister about his own possibility of cancer.  He is leaning towards wanting to have a prostate biopsy which is reasonable.  We discussed prostate biopsy in detail including the procedure itself, the risks of blood in the urine, stool, and ejaculate, serious infection, and discomfort. He is willing to proceed with this as discussed.  We will need to get clearance from his PCP to hold his Plavix.  All of his questions were answered.  2. BPH with urinary obstruction Improved with behavioral change and Flomax Continue this medication  3. Combined arterial insufficiency and corporo-venous occlusive erectile dysfunction We did not discuss this in great detail today, address the above We will follow-up with this issue after his prostate biopsy  Follow Up Instructions: Prostate biopsy   I discussed the assessment and treatment plan with the patient. The patient was provided an opportunity to ask questions and all were answered. The patient agreed with the plan and demonstrated an understanding of the instructions.   The patient was advised to call back or seek an in-person evaluation if the symptoms worsen or if the condition fails to improve as anticipated.  I provided 12 minutes of non-face-to-face time during this encounter.   Hollice Espy, MD

## 2018-08-21 ENCOUNTER — Telehealth: Payer: Self-pay | Admitting: *Deleted

## 2018-08-21 ENCOUNTER — Encounter: Payer: Self-pay | Admitting: *Deleted

## 2018-08-21 NOTE — Telephone Encounter (Addendum)
Spoke with patient reviewed prostate biopsy instructions and mailed copy.  Patient verbalized understanding. Faxed cardiac clearance to his PCP, Dr. Jorja Loa.

## 2018-09-19 ENCOUNTER — Ambulatory Visit (INDEPENDENT_AMBULATORY_CARE_PROVIDER_SITE_OTHER): Payer: PRIVATE HEALTH INSURANCE | Admitting: Urology

## 2018-09-19 ENCOUNTER — Other Ambulatory Visit: Payer: Self-pay

## 2018-09-19 ENCOUNTER — Encounter: Payer: Self-pay | Admitting: Urology

## 2018-09-19 ENCOUNTER — Other Ambulatory Visit: Payer: Self-pay | Admitting: Urology

## 2018-09-19 VITALS — BP 148/70 | HR 85 | Ht 70.0 in | Wt 240.0 lb

## 2018-09-19 DIAGNOSIS — R972 Elevated prostate specific antigen [PSA]: Secondary | ICD-10-CM

## 2018-09-19 MED ORDER — LEVOFLOXACIN 500 MG PO TABS
500.0000 mg | ORAL_TABLET | Freq: Once | ORAL | Status: AC
  Administered 2018-09-19: 500 mg via ORAL

## 2018-09-19 MED ORDER — GENTAMICIN SULFATE 40 MG/ML IJ SOLN
80.0000 mg | Freq: Once | INTRAMUSCULAR | Status: AC
  Administered 2018-09-19: 80 mg via INTRAMUSCULAR

## 2018-09-19 NOTE — Addendum Note (Signed)
Addended by: Verlene Mayer A on: 09/19/2018 04:03 PM   Modules accepted: Orders

## 2018-09-19 NOTE — Progress Notes (Signed)
   09/19/18  CC:  Chief Complaint  Patient presents with  . Prostate Biopsy    HPI: 63 year old male with a personal history of fluctuating PSA presents today for prostate biopsy.  Risks and benefits were previously discussed and reviewed again today in detail.  He did receive clearance to hold his Plavix.  Blood pressure (!) 148/70, pulse 85, height 5\' 10"  (1.778 m), weight 240 lb (108.9 kg). NED. A&Ox3.   No respiratory distress   Abd soft, NT, ND Normal sphincter tone  Prostate Biopsy Procedure   Informed consent was obtained after discussing risks/benefits of the procedure.  A time out was performed to ensure correct patient identity.  Pre-Procedure: - Gentamicin given prophylactically - Levaquin 500 mg administered PO -Transrectal Ultrasound performed revealing a 67.3 gm prostate -No significant hypoechoic or median lobe noted  Procedure: - Prostate block performed using 10 cc 1% lidocaine and biopsies taken from sextant areas, a total of 12 under ultrasound guidance.  Post-Procedure: - Patient tolerated the procedure well - He was counseled to seek immediate medical attention if experiences any severe pain, significant bleeding, or fevers - Return in one to two  week to discuss biopsy results    Hollice Espy, MD

## 2018-09-27 LAB — ANATOMIC PATHOLOGY REPORT: PDF Image: 0

## 2018-10-03 ENCOUNTER — Other Ambulatory Visit: Payer: Self-pay | Admitting: Urology

## 2018-10-03 ENCOUNTER — Encounter: Payer: Self-pay | Admitting: Urology

## 2018-10-03 ENCOUNTER — Other Ambulatory Visit: Payer: Self-pay

## 2018-10-03 ENCOUNTER — Ambulatory Visit (INDEPENDENT_AMBULATORY_CARE_PROVIDER_SITE_OTHER): Payer: PRIVATE HEALTH INSURANCE | Admitting: Urology

## 2018-10-03 VITALS — BP 152/67 | HR 80 | Ht 70.0 in | Wt 244.0 lb

## 2018-10-03 DIAGNOSIS — N401 Enlarged prostate with lower urinary tract symptoms: Secondary | ICD-10-CM

## 2018-10-03 DIAGNOSIS — N5203 Combined arterial insufficiency and corporo-venous occlusive erectile dysfunction: Secondary | ICD-10-CM

## 2018-10-03 DIAGNOSIS — C61 Malignant neoplasm of prostate: Secondary | ICD-10-CM

## 2018-10-03 DIAGNOSIS — N138 Other obstructive and reflux uropathy: Secondary | ICD-10-CM | POA: Diagnosis not present

## 2018-10-03 NOTE — Progress Notes (Signed)
10/03/2018 8:49 AM   Randall Flores. [birth year] [id number]  Referring provider: [name], MD [facility],  [place] [address]  Chief Complaint  Patient presents with   Results    HPI: 63 year old male who presents today to discuss newly diagnosed prostate cancer.  He initially was seen and evaluated for history of fluctuating PSA in the 4-7 range.  He ultimately elected to undergo prostate biopsy which was uncomplicated on [id number].  TRUS volume 67.3 g.    Pathology revealed 3 cores of Gleason 3+3 ranging up to 50% (left lateral base) of the tissue.  He has had a few cores of high-grade PIN and suspicious but nondiagnostic biopsy cores.  He previously complained of urinary symptoms including urinary frequency which subsided after he quit drinking beer and is now taking Flomax.  He is currently pleased with his urinary symptoms.  He also mentions today that he has a history of urethral stricture.He reports that when he was a teenager, this had to be dilated.    He has a duplicated left collecting system.  He continues to complain of erectile dysfunction.  He was prescribed Viagra/sildenafil last visit which he said was not effective whatsoever.    PMH: 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   Left, noted on CT renal   Hepatic hemangioma 02/03/2015   Small, noted on MRI Abd   History of colitis    History of kidney stones    Hypercholesteremia    Hypertension    Hypothyroidism    Major depression    Moderate aortic stenosis 05/24/2017   Noted on ECHO     Near syncope 04/25/2017   due to stress   OA (osteoarthritis)    knees, back, hands   Pre-diabetes    PVC (premature ventricular contraction)    Stroke ([place]) 2015   TIA/right side affected, right side has improved   Wears dentures    partial upper, front    Surgical History: Past Surgical History:  Procedure Laterality Date   [place], 2001   [place] L5-S1   COLONOSCOPY     [place]   COLONOSCOPY WITH PROPOFOL N/A 01/21/2017   Procedure: COLONOSCOPY WITH PROPOFOL;  Surgeon: [name], MD;  Location: [place];  Service: Endoscopy;  Laterality: N/A;   POLYPECTOMY  01/21/2017   Procedure: POLYPECTOMY;  Surgeon: [name], MD;  Location: [place];  Service: Endoscopy;;   TOTAL KNEE ARTHROPLASTY Right 04/07/2018   Procedure: TOTAL KNEE ARTHROPLASTY;  Surgeon: Earlie Server, MD;  Location: [facility];  Service: Orthopedics;  Laterality: Right;    Home Medications:  Allergies as of 10/03/2018      Reactions   Cod [fish Allergy]    Codeine Nausea And Vomiting   Ivp Dye [iodinated Diagnostic Agents] Nausea And Vomiting   Also felt very hot/flushed   Pravastatin    Other reaction(s): Muscle Pain   Shellfish Allergy Nausea And Vomiting      Medication List  Accurate as of October 03, 2018 11:59 PM. If you have any questions, ask your nurse or doctor.        acetaminophen 500 MG tablet Commonly known as: TYLENOL Take 1,000 mg by mouth every 8 (eight) hours as needed (pain).   citalopram 20 MG tablet Commonly known as: CELEXA Take 20 mg by mouth daily.   hydrocortisone cream 0.5 % Apply 1 application topically daily as needed for itching.   levothyroxine 150 MCG tablet Commonly known as: SYNTHROID Take 150 mcg by mouth daily before breakfast.   lisinopril 10 MG tablet Commonly known as: ZESTRIL Take 10 mg by mouth daily.   methocarbamol 500 MG tablet Commonly known as: Robaxin Take 1 tablet (500 mg total) by  mouth every 6 (six) hours as needed for muscle spasms.   multivitamin capsule Take 1 capsule by mouth daily.   rosuvastatin 10 MG tablet Commonly known as: CRESTOR Take 10 mg by mouth daily.   SALONPAS PAIN RELIEF PATCH EX Apply 1 patch topically daily as needed (pain).   sildenafil 20 MG tablet Commonly known as: Revatio Take 1 tablet (20 mg total) by mouth as needed. Take 1-5 tabs as needed prior to intercourse   tamsulosin 0.4 MG Caps capsule Commonly known as: Flomax Take 1 capsule (0.4 mg total) by mouth daily.   vitamin C 500 MG tablet Commonly known as: ASCORBIC ACID Take 500 mg by mouth daily.       Allergies:  Allergies  Allergen Reactions   Cod [Fish Allergy]    Codeine Nausea And Vomiting   Ivp Dye [Iodinated Diagnostic Agents] Nausea And Vomiting    Also felt very hot/flushed   Pravastatin     Other reaction(s): Muscle Pain   Shellfish Allergy Nausea And Vomiting    Family History: Family History  Problem Relation Age of Onset   Alzheimer's disease Mother     Social History:  reports that he has never smoked. He has never used smokeless tobacco. He reports current alcohol use of about 14.0 standard drinks of alcohol per week. He reports previous drug use. Drug: Marijuana.  ROS: UROLOGY Frequent Urination?: No Hard to postpone urination?: No Burning/pain with urination?: No Get up at night to urinate?: No Leakage of urine?: No Urine stream starts and stops?: No Trouble starting stream?: No Do you have to strain to urinate?: No Blood in urine?: No Urinary tract infection?: No Sexually transmitted disease?: No Injury to kidneys or bladder?: No Painful intercourse?: No Weak stream?: No Erection problems?: No Penile pain?: No  Gastrointestinal Nausea?: No Vomiting?: No Indigestion/heartburn?: No Diarrhea?: No Constipation?: No  Constitutional Fever: No Night sweats?: No Weight loss?: No Fatigue?: No  Skin Skin rash/lesions?:  No Itching?: No  Eyes Blurred vision?: No Double vision?: No  Ears/Nose/Throat Sore throat?: No Sinus problems?: No  Hematologic/Lymphatic Swollen glands?: No Easy bruising?: No  Cardiovascular Leg swelling?: No Chest pain?: No  Respiratory Cough?: No Shortness of breath?: No  Endocrine Excessive thirst?: No  Musculoskeletal Back pain?: No Joint pain?: No  Neurological Headaches?: No Dizziness?: No  Psychologic Depression?: No Anxiety?: No  Physical Exam: BP (!) 152/67    Pulse 80    Ht 5\' 10"  (1.778 m)    Wt 244 lb (110.7 kg)    BMI 35.01 kg/m   Constitutional:  Alert and oriented, No acute distress.  Wife present by speaker phone today. HEENT: [place] AT, moist mucus membranes.  Trachea midline, no masses. Cardiovascular: No clubbing, cyanosis, or edema.  Respiratory: Normal respiratory effort, no increased work of breathing. Skin: No rashes, bruises or suspicious lesions. Neurologic: Grossly intact, no focal deficits, moving all 4 extremities. Psychiatric: Normal mood and affect.  Laboratory Data: Lab Results  Component Value Date   WBC 9.8 04/09/2018   HGB 11.4 (L) 04/09/2018   HCT 35.7 (L) 04/09/2018   MCV 92.2 04/09/2018   PLT 238 04/09/2018    Lab Results  Component Value Date   CREATININE 0.90 04/08/2018    Assessment & Plan:    1. Prostate cancer [facility]) Newly diagnosed low risk prostate cancer  The patient was counseled about the natural history of prostate cancer and the standard treatment options that are available for prostate cancer. It was explained to him how his age and life expectancy, clinical stage, Gleason score, and PSA affect his prognosis, the decision to proceed with additional staging studies, as well as how that information influences recommended treatment strategies. We discussed the roles for active surveillance, radiation therapy, surgical therapy, androgen deprivation, as well as ablative therapy options for the treatment of  prostate cancer as appropriate to his individual cancer situation. We discussed the risks and benefits of these options with regard to their impact on cancer control and also in terms of potential adverse events, complications, and impact on quality of life particularly related to urinary, bowel, and sexual function. The patient was encouraged to ask questions throughout the discussion today and all questions were answered to his stated satisfaction. In addition, the patient was providedwith and/or directed to appropriate resources and literature for further education about prostate cancer treatment options.  Given his fairly low PSA as well as low risk low-volume disease, have strongly recommended active surveillance.  We will follow him on a every 4 month basis and consider prostate MRI versus prostate biopsy around the 1 year point if his PSA remains stable.  Alternative options were also discussed which were declined.   - PSA; Future  2. Combined arterial insufficiency and corporo-venous occlusive erectile dysfunction Not discussed today, will save this discussion for the future  3. BPH with urinary obstruction Symptoms well controlled on Flomax and behavioral modification Patient notably has prostamegaly  Return in about 4 months (around 02/02/2019) for PSA .  Hollice Espy, MD  [facility] [address] [place], [place] [address] [phone]  I spent 25 min with this patient of which greater than 50% was spent in counseling and coordination of care with the patient.

## 2018-10-11 ENCOUNTER — Telehealth: Payer: Self-pay | Admitting: Urology

## 2018-10-11 NOTE — Telephone Encounter (Signed)
Spoke with patient-denies pain, fever, body aches or any other symptoms. He notices the blood in his urine intermittently, denies clots. Aware to push fluids and notify the office if symptoms worsen.  Verbalized understanding.

## 2018-10-11 NOTE — Telephone Encounter (Signed)
Pt wants to know if it's normal for him to still have blood in his urine after biopsy from 9/8.

## 2018-10-24 ENCOUNTER — Other Ambulatory Visit: Payer: Self-pay | Admitting: Urology

## 2018-10-24 ENCOUNTER — Telehealth: Payer: Self-pay | Admitting: Urology

## 2018-10-24 MED ORDER — TAMSULOSIN HCL 0.4 MG PO CAPS: 0.4000 mg | ORAL_CAPSULE | Freq: Every day | ORAL | 0 refills | Status: DC

## 2018-10-24 NOTE — Telephone Encounter (Signed)
Refill sent in as requested, left patient a VM

## 2018-10-24 NOTE — Telephone Encounter (Signed)
Pt returned call and I read message 

## 2018-10-24 NOTE — Telephone Encounter (Signed)
Pt needs refill for Flomax

## 2018-11-01 ENCOUNTER — Emergency Department: Payer: 59

## 2018-11-01 ENCOUNTER — Other Ambulatory Visit: Payer: Self-pay

## 2018-11-01 ENCOUNTER — Encounter: Payer: Self-pay | Admitting: Emergency Medicine

## 2018-11-01 ENCOUNTER — Emergency Department
Admission: EM | Admit: 2018-11-01 | Discharge: 2018-11-02 | Disposition: A | Discharge status: Home / Self Care | Payer: 59 | Attending: Emergency Medicine | Admitting: Emergency Medicine

## 2018-11-01 DIAGNOSIS — I10 Essential (primary) hypertension: Secondary | ICD-10-CM | POA: Insufficient documentation

## 2018-11-01 DIAGNOSIS — Z79899 Other long term (current) drug therapy: Secondary | ICD-10-CM | POA: Insufficient documentation

## 2018-11-01 DIAGNOSIS — Z20828 Contact with and (suspected) exposure to other viral communicable diseases: Secondary | ICD-10-CM | POA: Diagnosis not present

## 2018-11-01 DIAGNOSIS — R1011 Right upper quadrant pain: Secondary | ICD-10-CM | POA: Diagnosis not present

## 2018-11-01 DIAGNOSIS — R112 Nausea with vomiting, unspecified: Secondary | ICD-10-CM | POA: Insufficient documentation

## 2018-11-01 DIAGNOSIS — E039 Hypothyroidism, unspecified: Secondary | ICD-10-CM | POA: Diagnosis not present

## 2018-11-01 LAB — COMPREHENSIVE METABOLIC PANEL
ALT: 22 U/L (ref 0–44)
AST: 22 U/L (ref 15–41)
Albumin: 4.7 g/dL (ref 3.5–5.0)
Alkaline Phosphatase: 68 U/L (ref 38–126)
Anion gap: 13 (ref 5–15)
BUN: 26 mg/dL — ABNORMAL HIGH (ref 8–23)
CO2: 23 mmol/L (ref 22–32)
Calcium: 9.6 mg/dL (ref 8.9–10.3)
Chloride: 102 mmol/L (ref 98–111)
Creatinine, Ser: 1.08 mg/dL (ref 0.61–1.24)
GFR calc Af Amer: >60 mL/min (ref >60)
GFR calc non Af Amer: >60 mL/min (ref >60)
Glucose, Bld: 134 mg/dL — ABNORMAL HIGH (ref 70–99)
Potassium: 4.1 mmol/L (ref 3.5–5.1)
Sodium: 138 mmol/L (ref 135–145)
Total Bilirubin: 0.8 mg/dL (ref 0.3–1.2)
Total Protein: 7.7 g/dL (ref 6.5–8.1)

## 2018-11-01 LAB — CBC
HCT: 39.1 % (ref 39.0–52.0)
Hemoglobin: 12.9 g/dL — ABNORMAL LOW (ref 13.0–17.0)
MCH: 28.9 pg (ref 26.0–34.0)
MCHC: 33 g/dL (ref 30.0–36.0)
MCV: 87.7 fL (ref 80.0–100.0)
Platelets: 306 10*3/uL (ref 150–400)
RBC: 4.46 MIL/uL (ref 4.22–5.81)
RDW: 13.5 % (ref 11.5–15.5)
WBC: 13.1 10*3/uL — ABNORMAL HIGH (ref 4.0–10.5)
nRBC: 0 % (ref 0.0–0.2)

## 2018-11-01 LAB — URINALYSIS, COMPLETE (UACMP) WITH MICROSCOPIC
Bilirubin Urine: NEGATIVE
Glucose, UA: NEGATIVE mg/dL
Hgb urine dipstick: NEGATIVE
Ketones, ur: 5 mg/dL — AB
Leukocytes,Ua: NEGATIVE
Nitrite: NEGATIVE
Protein, ur: 30 mg/dL — AB
Specific Gravity, Urine: 1.028 (ref 1.005–1.030)
Squamous Epithelial / HPF: NONE SEEN (ref 0–5)
pH: 5 (ref 5.0–8.0)

## 2018-11-01 LAB — LIPASE, BLOOD: Lipase: 25 U/L (ref 11–51)

## 2018-11-01 MED ORDER — FAMOTIDINE IN NACL 20-0.9 MG/50ML-% IV SOLN
20.0000 mg | Freq: Once | INTRAVENOUS | Status: AC
  Administered 2018-11-02: 20 mg via INTRAVENOUS
  Filled 2018-11-01: qty 50

## 2018-11-01 MED ORDER — SODIUM CHLORIDE 0.9 % IV BOLUS
1000.0000 mL | Freq: Once | INTRAVENOUS | Status: AC
  Administered 2018-11-01: 1000 mL via INTRAVENOUS

## 2018-11-01 MED ORDER — ONDANSETRON HCL 4 MG/2ML IJ SOLN
4.0000 mg | Freq: Once | INTRAMUSCULAR | Status: AC
  Administered 2018-11-01: 4 mg via INTRAVENOUS
  Filled 2018-11-01: qty 2

## 2018-11-01 MED ORDER — FENTANYL CITRATE (PF) 100 MCG/2ML IJ SOLN
50.0000 ug | Freq: Once | INTRAMUSCULAR | Status: AC
  Administered 2018-11-01: 50 ug via INTRAVENOUS
  Filled 2018-11-01: qty 2

## 2018-11-01 NOTE — ED Triage Notes (Signed)
Pt presents to ED via [facility] with sudden onset of RUQ abd pain approx 2 hours ago. Pt reports vomiting X8 since onset of symptoms. Denies similar symptoms previously.

## 2018-11-01 NOTE — ED Provider Notes (Signed)
[facility] Emergency Department Provider Note   ____________________________________________   First MD Initiated Contact with Patient 11/01/18 2309     (approximate)  I have reviewed the triage vital signs and the nursing notes.   HISTORY  Chief Complaint Abdominal Pain    HPI Randall Flores. is a 63 y.o. male who presents to the ED via EMS from home with a chief complaint of right upper quadrant abdominal pain approximately 8:30 PM.  Patient last ate at 2 PM.  Pain associated with nausea/vomiting x8.  Denies fever, cough, chest pain, shortness of breath, diarrhea, dysuria.  Last BM yesterday which was normal for patient.  Patient did have 2 COVID-19 exposures at work.       Past Medical History:  Diagnosis Date  . Anxiety   . BPH with obstruction/lower urinary tract symptoms   . Colon polyp    Multiple  . Complication of anesthesia    wakes up during anesthesia  . DDD (degenerative disc disease), lumbar   . Diverticulitis   . Diverticulosis   . Elevated PSA   . Fatty liver 01/22/2015   Mild, noted on [place] Abd  . Heart murmur   . 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 Hepatic hemangioma 02/03/2015   Small, noted on MRI Abd  . History of colitis   . History of kidney stones   . Hypercholesteremia   . Hypertension   . Hypothyroidism   . Major depression   . Moderate aortic stenosis 05/24/2017   Noted on ECHO  . Near syncope 04/25/2017   due to stress  . OA (osteoarthritis)    knees, back, hands  . Pre-diabetes   . PVC (premature ventricular contraction)   . Stroke [facility]) 2015   TIA/right side affected, right side has improved  . Wears dentures    partial upper, front    Patient Active Problem List   Diagnosis Date Noted  . Primary localized osteoarthritis of right knee 04/07/2018  . Colon cancer screening   . 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 Abdominal pain, right upper quadrant  02/06/2015  . Right lower quadrant abdominal pain 02/06/2015    Past Surgical History:  Procedure Laterality Date  . [place], 2001   [place] L5-S1  . COLONOSCOPY     [place]  . COLONOSCOPY WITH PROPOFOL N/A 01/21/2017   Procedure: COLONOSCOPY WITH PROPOFOL;  Surgeon: [name], MD;  Location: [place];  Service: Endoscopy;  Laterality: N/A;  . POLYPECTOMY  01/21/2017   Procedure: POLYPECTOMY;  Surgeon: [name], MD;  Location: [place];  Service: Endoscopy;;  . TOTAL KNEE ARTHROPLASTY Right 04/07/2018   Procedure: TOTAL KNEE ARTHROPLASTY;  Surgeon: Earlie Server, MD;  [facility];  Service: Orthopedics;  Laterality: Right;    Prior to Admission medications   Medication Sig Start Date End Date Taking? Authorizing Provider  acetaminophen (TYLENOL) 500 MG tablet Take 1,000 mg by mouth every 8 (eight) hours as needed (pain).    [provider]  citalopram (CELEXA) 20 MG tablet Take 20 mg by mouth daily.    [provider]  dicyclomine (BENTYL) 20 MG tablet Take 1 tablet (20 mg total) by mouth every 6 (six) hours as needed. 11/02/18   Paulette Blanch, MD  hydrocortisone cream 0.5 % Apply 1 application topically daily as needed for itching.    [provider]  levothyroxine (SYNTHROID, LEVOTHROID) 150  MCG tablet Take 150 mcg by mouth daily before breakfast.     [provider]  Liniments (SALONPAS PAIN RELIEF PATCH EX) Apply 1 patch topically daily as needed (pain).    [provider]  lisinopril (PRINIVIL,ZESTRIL) 10 MG tablet Take 10 mg by mouth daily.  07/30/14   [provider]  methocarbamol (ROBAXIN) 500 MG tablet Take 1 tablet (500 mg total) by mouth every 6 (six) hours as needed for muscle spasms. 04/07/18   [place], Vonna Kotyk, PA-C  Multiple Vitamin (MULTIVITAMIN) capsule Take 1 capsule by mouth daily.    [provider]  ondansetron (ZOFRAN ODT) 4 MG disintegrating tablet Take 1  tablet (4 mg total) by mouth every 8 (eight) hours as needed for nausea or vomiting. 11/02/18   Paulette Blanch, MD  rosuvastatin (CRESTOR) 10 MG tablet Take 10 mg by mouth daily.    [provider]  sildenafil (REVATIO) 20 MG tablet Take 1 tablet (20 mg total) by mouth as needed. Take 1-5 tabs as needed prior to intercourse 01/10/18   Hollice Espy, MD  tamsulosin [facility]) 0.4 MG CAPS capsule Take 1 capsule (0.4 mg total) by mouth daily. 10/24/18   Hollice Espy, MD  vitamin C (ASCORBIC ACID) 500 MG tablet Take 500 mg by mouth daily.    [provider]    Allergies [facility] allergy], Codeine, Ivp dye [iodinated diagnostic agents], Pravastatin, and Shellfish allergy  Family History  Problem Relation Age of Onset  . Alzheimer's disease Mother     Social History Social History   Tobacco Use  . Smoking status: Never Smoker  . Smokeless tobacco: Never Used  Substance Use Topics  . Alcohol use: Yes    Alcohol/week: 14.0 standard drinks    Types: 14 Cans of beer per week    Comment:    . Drug use: Not Currently    Types: Marijuana    Comment: occasional (01/12/17)-pt denies use    Review of Systems  Constitutional: No fever/chills Eyes: No visual changes. ENT: No sore throat. Cardiovascular: Denies chest pain. Respiratory: Denies shortness of breath. Gastrointestinal: Positive for right upper quadrant abdominal pain, nausea and vomiting.  No diarrhea.  No constipation. Genitourinary: Negative for dysuria. Musculoskeletal: Negative for back pain. Skin: Negative for rash. Neurological: Negative for headaches, focal weakness or numbness.   ____________________________________________   PHYSICAL EXAM:  VITAL SIGNS: ED Triage Vitals  Enc Vitals Group     BP 11/01/18 2025 120/71     Pulse Rate 11/01/18 2025 91     Resp 11/01/18 2025 18     Temp 11/01/18 2025 97.9 F (36.6 C)     Temp Source 11/01/18 2025 Oral     SpO2 11/01/18 2025 97 %     Weight  11/01/18 2026 240 lb (108.9 kg)     Height 11/01/18 2026 5\' 10"  (1.778 m)     Head Circumference --      Peak Flow --      Pain Score 11/01/18 2026 10     Pain Loc --      Pain Edu? --      Excl. in [place]? --     Constitutional: Alert and oriented. Well appearing and in mild acute distress. Eyes: Conjunctivae are normal. PERRL. EOMI. Head: Atraumatic. Nose: No congestion/rhinnorhea. Mouth/Throat: Mucous membranes are moist.  Oropharynx non-erythematous. Neck: No stridor.   Cardiovascular: Normal rate, regular rhythm. Grossly normal heart sounds.  Good peripheral circulation. Respiratory: Normal respiratory effort.  No retractions.  Lungs CTAB. Gastrointestinal: Soft and mildly tender to palpation right upper quadrant without rebound or guarding. No distention. No abdominal bruits. No CVA tenderness. Musculoskeletal: No lower extremity tenderness nor edema.  No joint effusions. Neurologic:  Normal speech and language. No gross focal neurologic deficits are appreciated. No gait instability. Skin:  Skin is warm, dry and intact. No rash noted. Psychiatric: Mood and affect are normal. Speech and behavior are normal.  ____________________________________________   LABS (all labs ordered are listed, but only abnormal results are displayed)  Labs Reviewed  COMPREHENSIVE METABOLIC PANEL - Abnormal; Notable for the following components:      Result Value   Glucose, Bld 134 (*)    BUN 26 (*)    All other components within normal limits  CBC - Abnormal; Notable for the following components:   WBC 13.1 (*)    Hemoglobin 12.9 (*)    All other components within normal limits  URINALYSIS, COMPLETE (UACMP) WITH MICROSCOPIC - Abnormal; Notable for the following components:   Color, Urine AMBER (*)    APPearance HAZY (*)    Ketones, ur 5 (*)    Protein, ur 30 (*)    Bacteria, UA RARE (*)    All other components within normal limits  SARS CORONAVIRUS 2 (HOSPITAL ORDER, [place] [facility])  LIPASE, BLOOD  TROPONIN I (HIGH SENSITIVITY)   ____________________________________________  EKG  ED ECG REPORT I, SUNG,JADE J, the attending physician, personally viewed and interpreted this ECG.   Date: 11/01/2018  EKG Time: 2052  Rate: 90  Rhythm: normal EKG, normal sinus rhythm  Axis: Normal  Intervals:none  ST&T Change: Nonspecific  ____________________________________________  RADIOLOGY  ED MD interpretation:  Liver cyst, fatty liver  Official radiology report(s): US Abdomen Limited Ruq  Result Date: 11/02/2018 CLINICAL DATA:  Right upper quadrant pain with nausea and vomiting EXAM: ULTRASOUND ABDOMEN LIMITED RIGHT UPPER QUADRANT COMPARISON:  CT 07/02/2016, MRI 02/03/2015 FINDINGS: Gallbladder: No gallstones or wall thickening visualized. No sonographic Murphy sign noted by sonographer. Common bile duct: Diameter: 3.6 mm Liver: Septated cyst in the left hepatic lobe measuring 5.7 x 3.6 x 4.1 cm. Increased hepatic echogenicity. Portal vein is patent on color Doppler imaging with normal direction of blood flow towards the liver. Other: None. IMPRESSION: 1. Negative for gallstones or biliary dilatation 2. Echogenic liver consistent with steatosis. 5.7 cm septated liver cyst as noted on previous examination Electronically Signed   By: Donavan Foil M.D.   On: 11/02/2018 00:20    ____________________________________________   PROCEDURES  Procedure(s) performed (including Critical Care):  Procedures   ____________________________________________   INITIAL IMPRESSION / ASSESSMENT AND PLAN / ED COURSE  As part of my medical decision making, I reviewed the following data within the [place] notes reviewed and incorporated, Labs reviewed, EKG interpreted, Old chart reviewed, Radiograph reviewed, Notes from prior ED visits and [place] [employer] [place]. was evaluated in Emergency Department on  11/02/2018 for the symptoms described in the history of present illness. He was evaluated in the context of the global COVID-19 pandemic, which necessitated consideration that the patient might be at risk for infection with the SARS-CoV-2 virus that causes COVID-19. Institutional protocols and algorithms that pertain to the evaluation of patients at risk for COVID-19 are in a state of rapid change based on information released by regulatory bodies including the CDC and federal and state organizations. These policies and algorithms were followed  during the patient's care in the ED.    63 year old male who presents with right upper quadrant abdominal pain associated with nausea/vomiting. Differential diagnosis includes, but is not limited to, biliary disease (biliary colic, acute cholecystitis, cholangitis, choledocholithiasis, etc), intrathoracic causes for epigastric abdominal pain including ACS, gastritis, duodenitis, pancreatitis, small bowel or large bowel obstruction, abdominal aortic aneurysm, hernia, and ulcer(s).  Laboratory results remarkable for mild leukocytosis, normal LFTs and lipase.  Will initiate IV fluid resuscitation, IV fentanyl and Pepcid for pain, IV Zofran for nausea.  Proceed with right upper quadrant abdominal ultrasound to evaluate for cholecystitis.  Will obtain rapid COVID-19 swab given patient's exposures plus in the event patient requires admission/surgery.   Clinical Course as of Nov 01 557  Thu Nov 02, 2018  0124 Patient feeling significantly better.  Updated him on all test results.  Strict return precautions given.  Patient verbalizes understanding agrees with plan of care.   [JS]    Clinical Course User Index [JS] Paulette Blanch, MD     ____________________________________________   FINAL CLINICAL IMPRESSION(S) / ED DIAGNOSES  Final diagnoses:  Right upper quadrant abdominal pain  Non-intractable vomiting with nausea, unspecified vomiting type     ED  Discharge Orders         Ordered    dicyclomine (BENTYL) 20 MG tablet  Every 6 hours PRN     11/02/18 0101    ondansetron (ZOFRAN ODT) 4 MG disintegrating tablet  Every 8 hours PRN     11/02/18 0101           Note:  This document was prepared using Dragon voice recognition software and may include unintentional dictation errors.   Paulette Blanch, MD 11/02/18 [phone]

## 2018-11-01 NOTE — ED Notes (Signed)
Ultrasound at bedside

## 2018-11-02 LAB — SARS CORONAVIRUS 2 BY RT PCR (HOSPITAL ORDER, PERFORMED IN ~~LOC~~ HOSPITAL LAB): SARS Coronavirus 2: NEGATIVE

## 2018-11-02 LAB — TROPONIN I (HIGH SENSITIVITY): Troponin I (High Sensitivity): 4 ng/L (ref <18)

## 2018-11-02 MED ORDER — DICYCLOMINE HCL 20 MG PO TABS: 20.0000 mg | ORAL_TABLET | Freq: Four times a day (QID) | ORAL | 0 refills | Status: DC | PRN

## 2018-11-02 MED ORDER — ONDANSETRON 4 MG PO TBDP: 4.0000 mg | ORAL_TABLET | Freq: Three times a day (TID) | ORAL | 0 refills | Status: DC | PRN

## 2018-11-02 NOTE — ED Notes (Signed)
Patient signed paper during downtime, discharged at [phone]

## 2018-11-02 NOTE — Discharge Instructions (Addendum)
1.  You may take medicines as needed for abdominal discomfort and nausea (Bentyl/Zofran #20). 2.  Clear liquids x12 hours, then slowly advance diet as tolerated. 3.  Return to the ER for worsening symptoms, persistent vomiting, difficulty breathing or other concerns.

## 2018-11-02 NOTE — ED Notes (Signed)
Lab processing add-on trop

## 2018-12-04 ENCOUNTER — Other Ambulatory Visit: Payer: Self-pay

## 2018-12-04 ENCOUNTER — Encounter: Payer: Self-pay | Admitting: Gastroenterology

## 2018-12-04 ENCOUNTER — Encounter (INDEPENDENT_AMBULATORY_CARE_PROVIDER_SITE_OTHER): Payer: Self-pay

## 2018-12-04 ENCOUNTER — Telehealth: Payer: Self-pay | Admitting: Gastroenterology

## 2018-12-04 ENCOUNTER — Ambulatory Visit (INDEPENDENT_AMBULATORY_CARE_PROVIDER_SITE_OTHER): Payer: 59 | Admitting: Gastroenterology

## 2018-12-04 VITALS — BP 128/72 | HR 74 | Temp 98.2°F | Ht 70.0 in | Wt 244.0 lb

## 2018-12-04 DIAGNOSIS — R160 Hepatomegaly, not elsewhere classified: Secondary | ICD-10-CM

## 2018-12-04 DIAGNOSIS — R1031 Right lower quadrant pain: Secondary | ICD-10-CM

## 2018-12-04 DIAGNOSIS — G8929 Other chronic pain: Secondary | ICD-10-CM

## 2018-12-04 NOTE — Telephone Encounter (Signed)
Pt left vm he stated he went to [employer] and the lady told him he couldn't get it done because he is between insurances  And did not have enough money today.

## 2018-12-04 NOTE — Progress Notes (Signed)
Gastroenterology Consultation  Referring Provider:     [name], MD Primary Care Physician:  [name], MD Primary Gastroenterologist:  Dr. Allen Norris     Reason for Consultation:     Liver lesion        HPI:   Randall Flores. is a 63 y.o. y/o male referred for consultation & management of liver lesion by Dr. Ouida Sills, Ocie Cornfield, MD.  This patient comes in today after having a colonoscopy by me in December 2018 for possible colitis biopsies being normal and not showing any signs of colitis.  The patient had imaging of his liver dating back to December 2016 that showed:  IMPRESSION: 1. Mild gallbladder sludge 2. Mild hepatic steatosis 3. 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 It is likely benign but the appearance warrants further investigation with hepatic protocol CT or MRI. Cyst measuring 37 x 45 x 43 mm  A follow-up MRI showed the cyst to be a 4.7 x 3.6 x 3.5 cm cystic lesion in January 2017.  In June 2018 another CT scan was done and reported "A large left hepatic lobe cyst is again seen."  An ultrasound last month showed:  IMPRESSION: 1. Negative for gallstones or biliary dilatation 2. Echogenic liver consistent with steatosis. 5.7 cm septated liver cyst as noted on previous examination  The patient was recently in the emergency room with right-sided abdominal pain.  The patient's pain was in the lower abdomen and he states that by the time the ER doctor saw him after few hours his pain had gone away.  He has not had any further abdominal pain in that area.  This was new for him and he reports that he has not had any symptoms like this before after.  The patient's wife was more concerned because the patient sister had recently died shortly after going to the hospital when she was found to have a large mass.  Past Medical History:  Diagnosis Date  . Anxiety   . BPH with obstruction/lower urinary tract symptoms   . Colon polyp    Multiple  .  Complication of anesthesia    wakes up during anesthesia  . DDD (degenerative disc disease), lumbar   . Diverticulitis   . Diverticulosis   . Elevated PSA   . Fatty liver 01/22/2015   Mild, noted on [place] Abd  . Heart murmur   . 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 Hepatic hemangioma 02/03/2015   Small, noted on MRI Abd  . History of colitis   . History of kidney stones   . Hypercholesteremia   . Hypertension   . Hypothyroidism   . Major depression   . Moderate aortic stenosis 05/24/2017   Noted on ECHO  . Near syncope 04/25/2017   due to stress  . OA (osteoarthritis)    knees, back, hands  . Pre-diabetes   . PVC (premature ventricular contraction)   . Stroke [facility]) 2015   TIA/right side affected, right side has improved  . Wears dentures    partial upper, front    Past Surgical History:  Procedure Laterality Date  . [place], 2001   [place] L5-S1  . COLONOSCOPY     [place]  . COLONOSCOPY WITH PROPOFOL N/A 01/21/2017   Procedure: COLONOSCOPY WITH PROPOFOL;  Surgeon: [name], MD;  Location: [place];  Service: Endoscopy;  Laterality: N/A;  . POLYPECTOMY  01/21/2017   Procedure:  POLYPECTOMY;  Surgeon: [name], MD;  Location: [place];  Service: Endoscopy;;  . TOTAL KNEE ARTHROPLASTY Right 04/07/2018   Procedure: TOTAL KNEE ARTHROPLASTY;  Surgeon: Earlie Server, MD;  Location: [facility];  Service: Orthopedics;  Laterality: Right;    Prior to Admission medications   Medication Sig Start Date End Date Taking? Authorizing Provider  acetaminophen (TYLENOL) 500 MG tablet Take 1,000 mg by mouth every 8 (eight) hours as needed (pain).    [provider]  citalopram (CELEXA) 20 MG tablet Take 20 mg by mouth daily.    [provider]  dicyclomine (BENTYL) 20 MG tablet Take 1 tablet (20 mg total) by mouth every 6 (six) hours as needed. 11/02/18   Paulette Blanch, MD  hydrocortisone cream 0.5 % Apply 1  application topically daily as needed for itching.    [provider]  levothyroxine (SYNTHROID, LEVOTHROID) 150 MCG tablet Take 150 mcg by mouth daily before breakfast.     [provider]  Liniments (SALONPAS PAIN RELIEF PATCH EX) Apply 1 patch topically daily as needed (pain).    [provider]  lisinopril (PRINIVIL,ZESTRIL) 10 MG tablet Take 10 mg by mouth daily.  07/30/14   [provider]  methocarbamol (ROBAXIN) 500 MG tablet Take 1 tablet (500 mg total) by mouth every 6 (six) hours as needed for muscle spasms. 04/07/18   [place], Vonna Kotyk, PA-C  Multiple Vitamin (MULTIVITAMIN) capsule Take 1 capsule by mouth daily.    [provider]  ondansetron (ZOFRAN ODT) 4 MG disintegrating tablet Take 1 tablet (4 mg total) by mouth every 8 (eight) hours as needed for nausea or vomiting. 11/02/18   Paulette Blanch, MD  rosuvastatin (CRESTOR) 10 MG tablet Take 10 mg by mouth daily.    [provider]  sildenafil (REVATIO) 20 MG tablet Take 1 tablet (20 mg total) by mouth as needed. Take 1-5 tabs as needed prior to intercourse 01/10/18   Hollice Espy, MD  tamsulosin [facility]) 0.4 MG CAPS capsule Take 1 capsule (0.4 mg total) by mouth daily. 10/24/18   Hollice Espy, MD  vitamin C (ASCORBIC ACID) 500 MG tablet Take 500 mg by mouth daily.    [provider]    Family History  Problem Relation Age of Onset  . Alzheimer's disease Mother      Social History   Tobacco Use  . Smoking status: Never Smoker  . Smokeless tobacco: Never Used  Substance Use Topics  . Alcohol use: Yes    Alcohol/week: 14.0 standard drinks    Types: 14 Cans of beer per week    Comment:    . Drug use: Not Currently    Types: Marijuana    Comment: occasional (01/12/17)-pt denies use    Allergies as of 12/04/2018 - Review Complete 11/01/2018  Allergen Reaction Noted  . [place] [fish allergy]  09/09/2014  . Codeine Nausea And Vomiting 09/09/2014  . Ivp dye [iodinated  diagnostic agents] Nausea And Vomiting 01/12/2017  . Pravastatin  01/21/2015  . Shellfish allergy Nausea And Vomiting 01/12/2017    Review of Systems:    All systems reviewed and negative except where noted in HPI.   Physical Exam:  There were no vitals taken for this visit. No LMP for male patient. General:   Alert,  Well-developed, well-nourished, pleasant and cooperative in NAD Head:  Normocephalic and atraumatic. Eyes:  Sclera clear, no icterus.   Conjunctiva pink. Ears:  Normal auditory acuity. Neck:  Supple; no masses or  thyromegaly. Lungs:  Respirations even and unlabored.  Clear throughout to auscultation.   No wheezes, crackles, or rhonchi. No acute distress. Heart:  Regular rate and rhythm; no murmurs, clicks, rubs, or gallops. Abdomen:  Normal bowel sounds.  No bruits.  Soft, non-tender and non-distended without masses, hepatosplenomegaly or hernias noted.  No guarding or rebound tenderness.  Negative Carnett sign.   Rectal:  Deferred.  Msk:  Symmetrical without gross deformities.  Good, equal movement & strength bilaterally. Pulses:  Normal pulses noted. Extremities:  No clubbing or edema.  No cyanosis. Neurologic:  Alert and oriented x3;  grossly normal neurologically. Skin:  Intact without significant lesions or rashes.  No jaundice. Lymph Nodes:  No significant cervical adenopathy. Psych:  Alert and cooperative. Normal mood and affect.  Imaging Studies: No results found.  Assessment and Plan:   Randall Flores. is a 63 y.o. y/o male who has a history of having a colonoscopy by me in the past and now comes in after having some right side abdominal pain.  The pain has resolved and he reports that when he was in the emergency room after having a bowel movement which was reported to be large and green his pain subsided.  The patient had a liver lesion that was followed up at the [facility] clinic which has not been evaluated for some time.  The patient will have his blood  sent off for liver enzymes, CEA, AFP and CA 19-9.  The patient has been explained the plan and agrees with it.    [name], MD. [name]    Note: This dictation was prepared with Dragon dictation along with smaller phrase technology. Any transcriptional errors that result from this process are unintentional.

## 2018-12-04 NOTE — Telephone Encounter (Signed)
Advised pt he can have his labs done at the [facility] outpatient lab. Pt will come back tomorrow to get these done.

## 2018-12-05 ENCOUNTER — Other Ambulatory Visit: Payer: Self-pay

## 2018-12-05 ENCOUNTER — Other Ambulatory Visit
Admission: RE | Admit: 2018-12-05 | Discharge: 2018-12-05 | Disposition: A | Discharge status: Home / Self Care | Payer: Medicaid Other | Attending: Gastroenterology | Admitting: Gastroenterology

## 2018-12-05 DIAGNOSIS — R160 Hepatomegaly, not elsewhere classified: Secondary | ICD-10-CM | POA: Insufficient documentation

## 2018-12-05 LAB — HEPATIC FUNCTION PANEL
ALT: 19 U/L (ref 0–44)
AST: 15 U/L (ref 15–41)
Albumin: 4.6 g/dL (ref 3.5–5.0)
Alkaline Phosphatase: 61 U/L (ref 38–126)
Bilirubin, Direct: 0.1 mg/dL (ref 0.0–0.2)
Indirect Bilirubin: 0.5 mg/dL (ref 0.3–0.9)
Total Bilirubin: 0.6 mg/dL (ref 0.3–1.2)
Total Protein: 7.5 g/dL (ref 6.5–8.1)

## 2018-12-06 LAB — CANCER ANTIGEN 19-9: CA 19-9: <2 U/mL (ref 0–35)

## 2018-12-06 LAB — AFP TUMOR MARKER: AFP, Serum, Tumor Marker: 3 ng/mL (ref 0.0–8.3)

## 2018-12-06 LAB — CEA: CEA: 2.3 ng/mL (ref 0.0–4.7)

## 2018-12-18 ENCOUNTER — Telehealth: Payer: Self-pay | Admitting: Gastroenterology

## 2018-12-18 NOTE — Telephone Encounter (Signed)
Pt left vm checking on his Lab results

## 2018-12-28 ENCOUNTER — Telehealth: Payer: Self-pay

## 2018-12-28 NOTE — Telephone Encounter (Signed)
-----   Message from [name], MD sent at 12/25/2018 12:19 PM EST ----- Let the patient know that all of his tumor markers were negative for any sign of liver cancer.  The cyst was seen back in 2017 and appears to be similar in size as his most recent imaging.  The patient should have a repeat ultrasound in 6 months to make sure it is stable.

## 2018-12-28 NOTE — Telephone Encounter (Signed)
Pt notified of lab results

## 2019-01-29 ENCOUNTER — Telehealth: Payer: Self-pay | Admitting: Urology

## 2019-01-29 MED ORDER — TAMSULOSIN HCL 0.4 MG PO CAPS: 0.4000 mg | ORAL_CAPSULE | Freq: Every day | ORAL | 3 refills | Status: DC

## 2019-01-29 NOTE — Telephone Encounter (Signed)
Pt would like a 90 day refill sent to pharmacy at [facility] for Tamsulosin.  This is near his house.

## 2019-01-29 NOTE — Telephone Encounter (Signed)
RX sent in to [facility] as requested. Patient notified.

## 2019-02-02 ENCOUNTER — Other Ambulatory Visit: Payer: PRIVATE HEALTH INSURANCE

## 2019-02-02 ENCOUNTER — Other Ambulatory Visit
Admission: RE | Admit: 2019-02-02 | Discharge: 2019-02-02 | Disposition: A | Discharge status: Home / Self Care | Payer: Medicaid Other | Source: Ambulatory Visit | Attending: Urology | Admitting: Urology

## 2019-02-02 DIAGNOSIS — C61 Malignant neoplasm of prostate: Secondary | ICD-10-CM | POA: Insufficient documentation

## 2019-02-02 LAB — PSA: Prostatic Specific Antigen: 4.37 ng/mL — ABNORMAL HIGH (ref 0.00–4.00)

## 2019-02-02 NOTE — Addendum Note (Signed)
Addended by: Santiago Bur on: 02/02/2019 11:42 AM   Modules accepted: Orders

## 2019-02-06 ENCOUNTER — Encounter: Payer: Self-pay | Admitting: Urology

## 2019-02-06 ENCOUNTER — Ambulatory Visit (INDEPENDENT_AMBULATORY_CARE_PROVIDER_SITE_OTHER): Payer: Self-pay | Admitting: Urology

## 2019-02-06 ENCOUNTER — Other Ambulatory Visit: Payer: Self-pay

## 2019-02-06 VITALS — BP 163/88 | HR 91 | Ht 70.0 in | Wt 246.0 lb

## 2019-02-06 DIAGNOSIS — C61 Malignant neoplasm of prostate: Secondary | ICD-10-CM

## 2019-02-06 DIAGNOSIS — N138 Other obstructive and reflux uropathy: Secondary | ICD-10-CM

## 2019-02-06 DIAGNOSIS — N401 Enlarged prostate with lower urinary tract symptoms: Secondary | ICD-10-CM

## 2019-02-06 NOTE — Progress Notes (Signed)
02/06/2019 4:20 PM   Randall Flores. 02-08-1955 [id number]  Referring provider: [name], MD [place] [facility] [place],  [place] [address]  Chief Complaint  Patient presents with  . Prostate Cancer    HPI: 64 year old male with a history of low risk prostate cancer diagnosed 09/2018 who returns today for 46-month follow-up with PSA.  He initially was seen and evaluated for history of fluctuating PSA in the 4-7 range.  He ultimately elected to undergo prostate biopsy which was uncomplicated on A999333.  TRUS volume 67.3 g.    Pathology revealed 3 cores of Gleason 3+3 ranging up to 50% (left lateral base) of the tissue.  He has had a few cores of high-grade PIN and suspicious but nondiagnostic biopsy cores.  Most recent PSA has remained stable at 4.37 on 02/02/2019.  Urinary symptoms are at baseline without additional bother.  Remains on flomax.  No weight loss or bone pain.  He mentions today that he lost his job secondary to [employer].  He lost his insurance.  He is negotiated with the hospital to get his labs drawn there is is more economical.   PMH: Past Medical History:  Diagnosis Date  . Anxiety   . BPH with obstruction/lower urinary tract symptoms   . Colon polyp    Multiple  . Complication of anesthesia    wakes up during anesthesia  . DDD (degenerative disc disease), lumbar   . Diverticulitis   . Diverticulosis   . Elevated PSA   . Fatty liver 01/22/2015   Mild, noted on [place] Abd  . Heart murmur   . 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 Hepatic hemangioma 02/03/2015   Small, noted on MRI Abd  . History of colitis   . History of kidney stones   . Hypercholesteremia   . Hypertension   . Hypothyroidism   . Major depression   . Moderate aortic stenosis 05/24/2017   Noted on ECHO  . Near syncope 04/25/2017   due to stress  . OA (osteoarthritis)    knees, back, hands  . Pre-diabetes   . PVC  (premature ventricular contraction)   . Stroke [facility]) 2015   TIA/right side affected, right side has improved  . Wears dentures    partial upper, front    Surgical History: Past Surgical History:  Procedure Laterality Date  . [place], 2001   [place] L5-S1  . COLONOSCOPY     [place]  . COLONOSCOPY WITH PROPOFOL N/A 01/21/2017   Procedure: COLONOSCOPY WITH PROPOFOL;  Surgeon: [name], MD;  Location: [place];  Service: Endoscopy;  Laterality: N/A;  . POLYPECTOMY  01/21/2017   Procedure: POLYPECTOMY;  Surgeon: [name], MD;  Location: [place];  Service: Endoscopy;;  . TOTAL KNEE ARTHROPLASTY Right 04/07/2018   Procedure: TOTAL KNEE ARTHROPLASTY;  Surgeon: Earlie Server, MD;  Location[facility];  Service: Orthopedics;  Laterality: Right;    Home Medications:  Allergies as of 02/06/2019      Reactions   [place] [fish Allergy]    Codeine Nausea And Vomiting   Ivp Dye [iodinated Diagnostic Agents] Nausea And Vomiting   Also felt very hot/flushed   Pravastatin    Other reaction(s): Muscle Pain   Shellfish Allergy Nausea And Vomiting      Medication List       Accurate as of February 06, 2019 11:59 PM. If you have any  questions, ask your nurse or doctor.        acetaminophen 500 MG tablet Commonly known as: TYLENOL Take 1,000 mg by mouth every 8 (eight) hours as needed (pain).   citalopram 20 MG tablet Commonly known as: CELEXA Take 20 mg by mouth daily.   dicyclomine 20 MG tablet Commonly known as: Bentyl Take 1 tablet (20 mg total) by mouth every 6 (six) hours as needed.   hydrocortisone cream 0.5 % Apply 1 application topically daily as needed for itching.   levothyroxine 150 MCG tablet Commonly known as: SYNTHROID Take 150 mcg by mouth daily before breakfast.   lisinopril 10 MG tablet Commonly known as: ZESTRIL Take 10 mg by mouth daily.   methocarbamol 500 MG tablet Commonly known as: Robaxin Take 1 tablet (500 mg  total) by mouth every 6 (six) hours as needed for muscle spasms.   multivitamin capsule Take 1 capsule by mouth daily.   ondansetron 4 MG disintegrating tablet Commonly known as: Zofran ODT Take 1 tablet (4 mg total) by mouth every 8 (eight) hours as needed for nausea or vomiting.   rosuvastatin 10 MG tablet Commonly known as: CRESTOR Take 10 mg by mouth daily.   SALONPAS PAIN RELIEF PATCH EX Apply 1 patch topically daily as needed (pain).   sildenafil 20 MG tablet Commonly known as: Revatio Take 1 tablet (20 mg total) by mouth as needed. Take 1-5 tabs as needed prior to intercourse   tamsulosin 0.4 MG Caps capsule Commonly known as: FLOMAX Take 1 capsule (0.4 mg total) by mouth daily.   vitamin C 500 MG tablet Commonly known as: ASCORBIC ACID Take 500 mg by mouth daily.       Allergies:  Allergies  Allergen Reactions  . Cod [facility] Allergy]   . Codeine Nausea And Vomiting  . Ivp Dye [Iodinated Diagnostic Agents] Nausea And Vomiting    Also felt very hot/flushed  . Pravastatin     Other reaction(s): Muscle Pain  . Shellfish Allergy Nausea And Vomiting    Family History: Family History  Problem Relation Age of Onset  . Alzheimer's disease Mother     Social History:  reports that he has never smoked. He has never used smokeless tobacco. He reports current alcohol use of about 14.0 standard drinks of alcohol per week. He reports previous drug use. Drug: Marijuana.  ROS: UROLOGY Frequent Urination?: No Hard to postpone urination?: No Burning/pain with urination?: No Get up at night to urinate?: No Leakage of urine?: No Urine stream starts and stops?: No Trouble starting stream?: No Do you have to strain to urinate?: No Blood in urine?: No Urinary tract infection?: No Sexually transmitted disease?: No Injury to kidneys or bladder?: No Painful intercourse?: No Weak stream?: No Erection problems?: No Penile pain?: No  Gastrointestinal Nausea?:  No Vomiting?: No Indigestion/heartburn?: No Diarrhea?: No Constipation?: No  Constitutional Fever: No Night sweats?: No Weight loss?: No Fatigue?: No  Skin Skin rash/lesions?: No Itching?: No  Eyes Blurred vision?: No Double vision?: No  Ears/Nose/Throat Sore throat?: No Sinus problems?: No  Hematologic/Lymphatic Swollen glands?: No Easy bruising?: No  Cardiovascular Leg swelling?: No Chest pain?: No  Respiratory Cough?: No Shortness of breath?: No  Endocrine Excessive thirst?: No  Musculoskeletal Back pain?: Yes Joint pain?: Yes  Neurological Headaches?: No Dizziness?: No  Psychologic Depression?: Yes Anxiety?: Yes  Physical Exam: BP (!) 163/88 (BP Location: Left Arm, Patient Position: Sitting, Cuff Size: Large)   Pulse 91   Ht 5\' 10"  (  1.778 m)   Wt 246 lb (111.6 kg)   BMI 35.30 kg/m   Constitutional:  Alert and oriented, No acute distress. HEENT: [place] AT, moist mucus membranes.  Trachea midline, no masses. Cardiovascular: No clubbing, cyanosis, or edema. Respiratory: Normal respiratory effort, no increased work of breathing. Skin: No rashes, bruises or suspicious lesions. Neurologic: Grossly intact, no focal deficits, moving all 4 extremities. Psychiatric: Normal mood and affect.  Laboratory Data: Lab Results  Component Value Date   WBC 13.1 (H) 11/01/2018   HGB 12.9 (L) 11/01/2018   HCT 39.1 11/01/2018   MCV 87.7 11/01/2018   PLT 306 11/01/2018    Lab Results  Component Value Date   CREATININE 1.08 11/01/2018     Assessment & Plan:    1. Prostate cancer ([place]) Low risk prostate cancer on active surveillance  PSA remain stable which is reassuring  We will plan to recheck his PSA again in 4 months  At the 1 year mark from his diagnosis, generally recommend either prostate MRI or confirmatory biopsy.  Risk and benefits of each were reviewed today.  He is leaning towards prostate MRI which may or may not lead to a fusion biopsy  pending the results.  We will hold off and deciding how to proceed based on his next PSA.  He is agreeable this plan.  Importance of timely and consistent follow-up with prostate cancer reviewed.  He understands and will make this a priority.  He is working to get some sort of insurance in the interim. - PSA; Future  2. BPH with urinary obstruction Continue Flomax Symptoms stable .  Return in 4 months (on 06/06/2019) for 4 months for virtual (phone visit) with PSA prior (to be done at [facility] lab, pt scheduled).  Hollice Espy, MD  [facility] [address], [place], [place] [address] [phone]

## 2019-05-30 ENCOUNTER — Ambulatory Visit: Payer: Medicaid Other | Attending: Internal Medicine

## 2019-05-30 DIAGNOSIS — Z23 Encounter for immunization: Secondary | ICD-10-CM

## 2019-05-30 NOTE — Progress Notes (Signed)
   [id number] [facility]  Name:  Randall Flores.    MRN: [id number] DOB: 12/20/1955  05/30/2019  Mr. Elgart was observed post Covid-19 immunization for 15 minutes without incident. He was provided with Vaccine Information Sheet and instruction to access the V-Safe system.   Mr. Ketterling was instructed to call 911 with any severe reactions post vaccine: Marland Kitchen Difficulty breathing  . Swelling of face and throat  . A fast heartbeat  . A bad rash all over body  . Dizziness and weakness   Immunizations Administered    Name Date Dose VIS Date Route   Pfizer COVID-19 Vaccine 05/30/2019  9:12 AM 0.3 mL 03/21/2018 Intramuscular   Manufacturer: [employer], [employer]   Lot: [id number]   [place]: [id number]

## 2019-06-05 NOTE — Progress Notes (Incomplete)
Virtual Visit via Video Note  I connected with Randall Flores. on 06/06/19 at  1:30 PM EDT by a video enabled telemedicine application and verified that I am speaking with the correct person using two identifiers.  Location: Patient: home Provider: office   I discussed the limitations of evaluation and management by telemedicine and the availability of in person appointments. The patient expressed understanding and agreed to proceed.  History of Present Illness: Randall Flores. is a 64 y.o. M with a history of low risk prostate cancer diagnosed 09/2018 who returns today for a 4 month f/u.   He initially was seen and evaluated for history of fluctuating PSA in the 4-7 range. He ultimately elected to undergo prostate biopsy which was uncomplicated on A999333.  TRUS volume 67.3 g.  Pathology revealed 3 cores of Gleason 3+3 ranging up to 50%(left lateral base)of the tissue. He has had a few cores of high-grade PIN and suspicious but nondiagnostic biopsy cores.  Most recent PSA 4.37 as of 02/02/19.      Observations/Objective: Pt is engaged and asking good questions.   Assessment and Plan:   Follow Up Instructions:    I discussed the assessment and treatment plan with the patient. The patient was provided an opportunity to ask questions and all were answered. The patient agreed with the plan and demonstrated an understanding of the instructions.   The patient was advised to call back or seek an in-person evaluation if the symptoms worsen or if the condition fails to improve as anticipated.  I provided *** minutes of non-face-to-face time during this encounter.  Jamas Lav, am acting as a scribe for Dr. Hollice Espy,  {Add Scribe Attestation Statement}

## 2019-06-06 ENCOUNTER — Telehealth: Payer: PRIVATE HEALTH INSURANCE | Admitting: Urology

## 2019-06-06 ENCOUNTER — Other Ambulatory Visit: Payer: Self-pay

## 2019-06-06 ENCOUNTER — Telehealth: Payer: Self-pay | Admitting: *Deleted

## 2019-06-06 NOTE — Telephone Encounter (Signed)
Per Dr. Erlene Quan, pt did not get his PSA for today's virtual visit and this would need to be rescheduled until he get his PSA.  Marland Kitchenleft message to have patient return my call.

## 2019-06-07 NOTE — Telephone Encounter (Signed)
.  left message to have patient return my call.  

## 2019-06-14 NOTE — Telephone Encounter (Signed)
letter mailed to patients home address, unable to reach.

## 2019-06-27 ENCOUNTER — Other Ambulatory Visit: Payer: Self-pay

## 2019-06-27 DIAGNOSIS — C61 Malignant neoplasm of prostate: Secondary | ICD-10-CM

## 2019-06-27 NOTE — Progress Notes (Signed)
06/28/19  12:39 PM   Randall Flores. [birth year] [id number]  Referring provider: Frazier Richards, [place],  [place] [address] No chief complaint on file.   HPI: Randall Flores. is a 64 y.o. M with a history of low risk prostate cancer diagnosed 09/2018 who returns today for 70-month follow-up with PSA.  He initially was seen and evaluated for history of fluctuating PSA in the 4-7 range. He ultimately elected to undergo prostate biopsy which was uncomplicated on [id number].  TRUS volume 67.3 g.  Pathology revealed 3 cores of Gleason 3+3 ranging up to 50%(left lateral base)of the tissue. He has had a few cores of high-grade PIN and suspicious but nondiagnostic biopsy cores.  Most recent PSA 5.4 as of 06/27/19.   He reports of having no energy, irritability, loud snoring and large volume nocturia for a long time which worsens when he drinks 3 beers. He has not been to his PCP for a year since he does not have insurance. Remains on flomax.   PMH: Past Medical History:  Diagnosis Date  . Anxiety   . BPH with obstruction/lower urinary tract symptoms   . Colon polyp    Multiple  . Complication of anesthesia    wakes up during anesthesia  . DDD (degenerative disc disease), lumbar   . Diverticulitis   . Diverticulosis   . Elevated PSA   . Fatty liver 01/22/2015   Mild, noted on [place] Abd  . Heart murmur   . 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 Hepatic hemangioma 02/03/2015   Small, noted on MRI Abd  . History of colitis   . History of kidney stones   . Hypercholesteremia   . Hypertension   . Hypothyroidism   . Major depression   . Moderate aortic stenosis 05/24/2017   Noted on ECHO  . Near syncope 04/25/2017   due to stress  . OA (osteoarthritis)    knees, back, hands  . Pre-diabetes   . PVC (premature ventricular contraction)   . Stroke [facility]) 2015   TIA/right side affected, right side has improved  . Wears dentures    partial upper, front    Surgical History: Past Surgical History:  Procedure Laterality Date  . [place], 2001   [place] L5-S1  . COLONOSCOPY     [place]  . COLONOSCOPY WITH PROPOFOL N/A 01/21/2017   Procedure: COLONOSCOPY WITH PROPOFOL;  Surgeon: [name], MD;  Location: [place];  Service: Endoscopy;  Laterality: N/A;  . POLYPECTOMY  01/21/2017   Procedure: POLYPECTOMY;  Surgeon: [name], MD;  Location: [place];  Service: Endoscopy;;  . TOTAL KNEE ARTHROPLASTY Right 04/07/2018   Procedure: TOTAL KNEE ARTHROPLASTY;  Surgeon: Earlie Server, MD;  Location: [facility];  Service: Orthopedics;  Laterality: Right;    Home Medications:  Allergies as of 06/28/2019      Reactions   Cod [fish Allergy]    Codeine Nausea And Vomiting   Ivp Dye [iodinated Diagnostic Agents] Nausea And Vomiting   Also felt very hot/flushed   Pravastatin    Other reaction(s): Muscle Pain   Shellfish Allergy Nausea And Vomiting      Medication List       Accurate as of June 28, 2019 11:59 PM. If you have any questions, ask your nurse or doctor.        STOP taking these medications   dicyclomine 20 MG tablet Commonly  known as: Bentyl Stopped by: Hollice Espy, MD   methocarbamol 500 MG tablet Commonly known as: Robaxin Stopped by: Hollice Espy, MD   ondansetron 4 MG disintegrating tablet Commonly known as: Zofran ODT Stopped by: Hollice Espy, MD     TAKE these medications   acetaminophen 500 MG tablet Commonly known as: TYLENOL Take 1,000 mg by mouth every 8 (eight) hours as needed (pain).   citalopram 20 MG tablet Commonly known as: CELEXA Take 20 mg by mouth daily.   DULoxetine 60 MG capsule Commonly known as: CYMBALTA Take 60 mg by mouth daily.   hydrocortisone cream 0.5 % Apply 1 application topically daily as needed for itching.   levothyroxine 150 MCG tablet Commonly known as: SYNTHROID Take 150 mcg by mouth daily before  breakfast.   lisinopril 10 MG tablet Commonly known as: ZESTRIL Take 10 mg by mouth daily.   multivitamin capsule Take 1 capsule by mouth daily.   rosuvastatin 10 MG tablet Commonly known as: CRESTOR Take 10 mg by mouth daily.   SALONPAS PAIN RELIEF PATCH EX Apply 1 patch topically daily as needed (pain).   sildenafil 20 MG tablet Commonly known as: Revatio Take 1 tablet (20 mg total) by mouth as needed. Take 1-5 tabs as needed prior to intercourse   tamsulosin 0.4 MG Caps capsule Commonly known as: FLOMAX Take 1 capsule (0.4 mg total) by mouth daily.   vitamin C 500 MG tablet Commonly known as: ASCORBIC ACID Take 500 mg by mouth daily.       Allergies:  Allergies  Allergen Reactions  . Cod [facility]]   . Codeine Nausea And Vomiting  . Ivp Dye [Iodinated Diagnostic Agents] Nausea And Vomiting    Also felt very hot/flushed  . Pravastatin     Other reaction(s): Muscle Pain  . Shellfish Allergy Nausea And Vomiting    Family History: Family History  Problem Relation Age of Onset  . Alzheimer's disease Mother     Social History:  reports that he has never smoked. He has never used smokeless tobacco. He reports current alcohol use of about 14.0 standard drinks of alcohol per week. He reports previous drug use. Drug: Marijuana.   Physical Exam: There were no vitals taken for this visit.  Constitutional:  Alert and oriented, No acute distress. HEENT: [place] AT, moist mucus membranes.  Trachea midline, no masses. Cardiovascular: No clubbing, cyanosis, or edema. Respiratory: Normal respiratory effort, no increased work of breathing. Skin: No rashes, bruises or suspicious lesions. Neurologic: Grossly intact, no focal deficits, moving all 4 extremities. Psychiatric: Normal mood and affect.  Laboratory Data:  Urinalysis Negative   Assessment & Plan:    1. Prostate cancer Low risk prostate cancer on active surveillance PSA remain stable which is reassuring We  will plan to recheck his PSA/DRE again in 4 months  At the 1 year mark from his diagnosis, generally recommend either prostate MRI or confirmatory biopsy.  Risk and benefits of each were reviewed today.  He is leaning towards prostate MRI which may or may not lead to a fusion biopsy pending the results.  We will hold off and deciding how to proceed based on his next PSA.  He is agreeable this plan.  Importance of timely and consistent follow-up with prostate cancer reviewed.  He understands and will make this a priority.  He is working to get some sort of insurance in the interim.  2. BPH with urinary obstruction UA negative  Continue Flomax Symptoms stable  3. Nocturia/fatigue/snoring  Recommended consulting w/ PCP for sleep study  May need a CPAP [address], [place], [place] [address] [phone]  I, Lucas Mallow, am acting as a scribe for Dr. Hollice Espy,  I have reviewed the above documentation for accuracy and completeness, and I agree with the above.   Hollice Espy, MD

## 2019-06-27 NOTE — Addendum Note (Signed)
Addended by: Maryln Gottron on: 06/27/2019 10:49 AM   Modules accepted: Orders

## 2019-06-28 ENCOUNTER — Telehealth: Payer: Self-pay

## 2019-06-28 ENCOUNTER — Ambulatory Visit (INDEPENDENT_AMBULATORY_CARE_PROVIDER_SITE_OTHER): Payer: Medicaid Other | Admitting: Urology

## 2019-06-28 ENCOUNTER — Other Ambulatory Visit: Payer: Self-pay

## 2019-06-28 DIAGNOSIS — C61 Malignant neoplasm of prostate: Secondary | ICD-10-CM

## 2019-06-28 DIAGNOSIS — R160 Hepatomegaly, not elsewhere classified: Secondary | ICD-10-CM

## 2019-06-28 DIAGNOSIS — N138 Other obstructive and reflux uropathy: Secondary | ICD-10-CM

## 2019-06-28 DIAGNOSIS — N401 Enlarged prostate with lower urinary tract symptoms: Secondary | ICD-10-CM

## 2019-06-28 DIAGNOSIS — R351 Nocturia: Secondary | ICD-10-CM

## 2019-06-28 LAB — PSA: Prostate Specific Ag, Serum: 5.4 ng/mL — ABNORMAL HIGH (ref 0.0–4.0)

## 2019-06-28 NOTE — Telephone Encounter (Signed)
Left vm informing pt he is due for his 6 month repeat US to re evaluate the liver cyst noted on previous US dated 11/02/2018.

## 2019-06-28 NOTE — Telephone Encounter (Signed)
-----   Message from Aiyanah Kalama, CMA sent at 12/28/2018  2:39 PM EST -----  ----- Message ----- From: Annaliese Saez, CMA Sent: 12/28/2018   2:38 PM EST To: Raena Pau, CMA  Pt needs repeat US to reevaluate liver cyst.    

## 2019-06-29 LAB — URINALYSIS, COMPLETE
Bilirubin, UA: NEGATIVE
Glucose, UA: NEGATIVE
Ketones, UA: NEGATIVE
Leukocytes,UA: NEGATIVE
Nitrite, UA: NEGATIVE
Protein,UA: NEGATIVE
RBC, UA: NEGATIVE
Specific Gravity, UA: 1.015 (ref 1.005–1.030)
Urobilinogen, Ur: 0.2 mg/dL (ref 0.2–1.0)
pH, UA: 7 (ref 5.0–7.5)

## 2019-06-29 LAB — MICROSCOPIC EXAMINATION
Bacteria, UA: NONE SEEN
Epithelial Cells (non renal): NONE SEEN /hpf (ref 0–10)
RBC, Urine: NONE SEEN /hpf (ref 0–2)

## 2019-07-10 ENCOUNTER — Telehealth: Payer: Self-pay

## 2019-07-10 NOTE — Telephone Encounter (Signed)
-----   Message from Glennie Isle, [place] sent at 12/28/2018  2:39 PM EST -----  ----- Message ----- From: Glennie Isle, CMA Sent: 12/28/2018   2:38 PM EST To: Hobart Marte, CMA  Pt needs repeat US to reevaluate liver cyst.

## 2019-07-10 NOTE — Telephone Encounter (Signed)
Spoke with pt regarding scheduling repeat US to reevaluate liver cyst noted on [place] dated 11/02/2018. Pt stated he would like to put this off until he can get Medicare. He stated he doesn't have insurance currently and is only doing what is necessary at this time. Pt requested a call back in a year when he receives new insurance. Advised pt if anything changes before then to please call and we will get this scheduled.

## 2019-10-08 ENCOUNTER — Other Ambulatory Visit: Payer: Medicaid Other

## 2019-10-08 ENCOUNTER — Other Ambulatory Visit: Payer: Self-pay | Admitting: Critical Care Medicine

## 2019-10-08 DIAGNOSIS — Z20822 Contact with and (suspected) exposure to covid-19: Secondary | ICD-10-CM

## 2019-10-09 LAB — NOVEL CORONAVIRUS, NAA: SARS-CoV-2, NAA: NOT DETECTED

## 2019-10-09 LAB — SARS-COV-2, NAA 2 DAY TAT

## 2019-10-31 ENCOUNTER — Other Ambulatory Visit: Payer: Self-pay

## 2019-11-01 ENCOUNTER — Ambulatory Visit: Payer: Self-pay | Admitting: Urology

## 2020-01-15 ENCOUNTER — Telehealth: Payer: Self-pay | Admitting: Gastroenterology

## 2020-01-15 NOTE — Telephone Encounter (Signed)
Patient states he is having a diverticulitis flare up and wants to know if he can have some antibiotic samples since his insurance is in a lag period right now. Please advise and let pt know.

## 2020-01-15 NOTE — Telephone Encounter (Signed)
Let the patient know that we don't have and never had any antibiotic samples in the office. So I am sorry to say I can not give any samples since we don't have them.

## 2020-01-16 NOTE — Telephone Encounter (Signed)
Patient verbalized understanding  

## 2020-03-04 ENCOUNTER — Other Ambulatory Visit: Payer: Self-pay | Admitting: Urology

## 2020-07-10 ENCOUNTER — Telehealth: Payer: Self-pay

## 2020-07-10 NOTE — Telephone Encounter (Signed)
Contacted pt to advised he is due for his repeat ultrasound. Pt stated his new insurance will go into effect on 09/25/20. He would like to wait until then to schedule. He stated he also needs a colonoscopy which he will discuss with Dr. Allen Norris during his office appt in September. We will schedule all these test at that time per pt request due to insurance.

## 2020-07-10 NOTE — Telephone Encounter (Signed)
-----   Message from Glennie Isle, [place] sent at 07/10/2019  4:36 PM EDT ----- See messages. Pt needs repeat US to reevaluate liver cyst noted on [place] 10/2018. Pt requested a call when he received his new medicare in a year.

## 2020-08-25 ENCOUNTER — Emergency Department: Payer: Medicaid Other

## 2020-08-25 ENCOUNTER — Other Ambulatory Visit: Payer: Self-pay

## 2020-08-25 ENCOUNTER — Emergency Department
Admission: EM | Admit: 2020-08-25 | Discharge: 2020-08-25 | Disposition: A | Discharge status: Home / Self Care | Payer: Medicaid Other | Attending: Emergency Medicine | Admitting: Emergency Medicine

## 2020-08-25 DIAGNOSIS — R0789 Other chest pain: Secondary | ICD-10-CM | POA: Insufficient documentation

## 2020-08-25 DIAGNOSIS — Z20822 Contact with and (suspected) exposure to covid-19: Secondary | ICD-10-CM | POA: Insufficient documentation

## 2020-08-25 DIAGNOSIS — Z79899 Other long term (current) drug therapy: Secondary | ICD-10-CM | POA: Insufficient documentation

## 2020-08-25 DIAGNOSIS — Z853 Personal history of malignant neoplasm of breast: Secondary | ICD-10-CM | POA: Insufficient documentation

## 2020-08-25 DIAGNOSIS — Z96651 Presence of right artificial knee joint: Secondary | ICD-10-CM | POA: Insufficient documentation

## 2020-08-25 DIAGNOSIS — F419 Anxiety disorder, unspecified: Secondary | ICD-10-CM | POA: Insufficient documentation

## 2020-08-25 DIAGNOSIS — I10 Essential (primary) hypertension: Secondary | ICD-10-CM | POA: Insufficient documentation

## 2020-08-25 DIAGNOSIS — R0602 Shortness of breath: Secondary | ICD-10-CM | POA: Insufficient documentation

## 2020-08-25 DIAGNOSIS — F439 Reaction to severe stress, unspecified: Secondary | ICD-10-CM | POA: Insufficient documentation

## 2020-08-25 DIAGNOSIS — R0600 Dyspnea, unspecified: Secondary | ICD-10-CM

## 2020-08-25 DIAGNOSIS — E039 Hypothyroidism, unspecified: Secondary | ICD-10-CM | POA: Insufficient documentation

## 2020-08-25 LAB — CBC
HCT: 41.8 % (ref 39.0–52.0)
Hemoglobin: 14.4 g/dL (ref 13.0–17.0)
MCH: 29.4 pg (ref 26.0–34.0)
MCHC: 34.4 g/dL (ref 30.0–36.0)
MCV: 85.5 fL (ref 80.0–100.0)
Platelets: 293 10*3/uL (ref 150–400)
RBC: 4.89 MIL/uL (ref 4.22–5.81)
RDW: 12.2 % (ref 11.5–15.5)
WBC: 6.9 10*3/uL (ref 4.0–10.5)
nRBC: 0 % (ref 0.0–0.2)

## 2020-08-25 LAB — BASIC METABOLIC PANEL
Anion gap: 7 (ref 5–15)
BUN: 17 mg/dL (ref 8–23)
CO2: 25 mmol/L (ref 22–32)
Calcium: 9.4 mg/dL (ref 8.9–10.3)
Chloride: 104 mmol/L (ref 98–111)
Creatinine, Ser: 1.11 mg/dL (ref 0.61–1.24)
GFR, Estimated: >60 mL/min (ref >60)
Glucose, Bld: 100 mg/dL — ABNORMAL HIGH (ref 70–99)
Potassium: 4 mmol/L (ref 3.5–5.1)
Sodium: 136 mmol/L (ref 135–145)

## 2020-08-25 LAB — TROPONIN I (HIGH SENSITIVITY): Troponin I (High Sensitivity): 6 ng/L (ref <18)

## 2020-08-25 LAB — RESP PANEL BY RT-PCR (FLU A&B, COVID) ARPGX2
Influenza A by PCR: NEGATIVE
Influenza B by PCR: NEGATIVE
SARS Coronavirus 2 by RT PCR: NEGATIVE

## 2020-08-25 MED ORDER — HYDROXYZINE HCL 25 MG PO TABS
25.0000 mg | ORAL_TABLET | Freq: Once | ORAL | Status: AC
  Administered 2020-08-25: 25 mg via ORAL
  Filled 2020-08-25: qty 1

## 2020-08-25 MED ORDER — HYDROXYZINE HCL 25 MG PO TABS: 25.0000 mg | ORAL_TABLET | Freq: Three times a day (TID) | ORAL | 0 refills | Status: DC | PRN

## 2020-08-25 NOTE — ED Triage Notes (Signed)
Pt to ER via POV. Reports for the last week he has had increasing shortness of breath and weakness. Pt also reports left sided chest pain that he describes as tightness. 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 Reports over the last nine months he has lost over 30lbs.   Was previously seen by his PCP last month and was seen by a mental health councilor and diagnosed with anxiety and depression.

## 2020-08-25 NOTE — ED Notes (Signed)
FIRST NURSE: Pt ambulatory to triage with c/SHOB. Pt speaking in complete uninterrupted sentences.  States has had difficulty most of the last week, but seems to be getting worse.

## 2020-08-25 NOTE — ED Provider Notes (Signed)
[facility] Emergency Department Provider Note  Time seen: 12:37 PM  I have reviewed the triage vital signs and the nursing notes.   HISTORY  Chief Complaint Shortness of Breath and Weakness   HPI Randall Flores. is a 65 y.o. male with a past medical history of anxiety, depression, hypertension, hyperlipidemia, TIA, presents emergency department for chest pain.  Patient states he is under tremendous amount of stress recently, states his wife is battling brain/breast cancer and has been given only months more to live.  States he is more frequently getting what he believes are panic attacks or anxiety when she will have shortness of breath sometimes chest discomfort.  Patient states he is not sleeping at night gets 2 to 3 hours most nights because he is up worrying or up with his wife.  States that 1 time he attempted to take a sleep aid his wife tried to get up overnight and ended up falling.  Patient is tearful at times throughout exam.   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   Left, noted on CT renal   Hepatic hemangioma 02/03/2015   Small, noted on MRI Abd   History of colitis    History of kidney stones    Hypercholesteremia    Hypertension    Hypothyroidism    Major depression    Moderate aortic stenosis 05/24/2017   Noted on ECHO   Near syncope 04/25/2017   due to stress   OA (osteoarthritis)    knees, back, hands   Pre-diabetes    PVC (premature ventricular contraction)    Stroke ([place]) 2015   TIA/right side affected, right side has improved   Wears dentures    partial upper, front    Patient Active  Problem List   Diagnosis Date Noted   Primary localized osteoarthritis of right knee 04/07/2018   Colon cancer screening    Pseudopolyposis of colon without complication ([place])    Abdominal pain, right upper quadrant 02/06/2015   Right lower quadrant abdominal pain 02/06/2015    Past Surgical History:  Procedure Laterality Date   [place], 2001   [place] L5-S1   COLONOSCOPY     [place]   COLONOSCOPY WITH PROPOFOL N/A 01/21/2017   Procedure: COLONOSCOPY WITH PROPOFOL;  Surgeon: [name], MD;  Location: [place];  Service: Endoscopy;  Laterality: N/A;   POLYPECTOMY  01/21/2017   Procedure: POLYPECTOMY;  Surgeon: [name], MD;  Location: [place];  Service: Endoscopy;;   TOTAL KNEE ARTHROPLASTY Right 04/07/2018   Procedure: TOTAL KNEE ARTHROPLASTY;  Surgeon: Earlie Server, MD;  [facility];  Service: Orthopedics;  Laterality: Right;    Prior to Admission medications   Medication Sig Start Date End Date Taking? Authorizing Provider  acetaminophen (TYLENOL) 500 MG tablet Take 1,000 mg by mouth every 8 (eight) hours as needed (pain).    [provider]  citalopram (CELEXA) 20 MG tablet Take 20 mg by mouth daily.    [provider]  DULoxetine (CYMBALTA) 60 MG capsule  Take 60 mg by mouth daily.    [provider]  hydrocortisone cream 0.5 % Apply 1 application topically daily as needed for itching.    [provider]  levothyroxine (SYNTHROID, LEVOTHROID) 150 MCG tablet Take 150 mcg by mouth daily before breakfast.     [provider]  Liniments (SALONPAS PAIN RELIEF PATCH EX) Apply 1 patch topically daily as needed (pain).    [provider]  lisinopril (PRINIVIL,ZESTRIL) 10 MG tablet Take 10 mg by mouth daily.  07/30/14   [provider]  Multiple Vitamin (MULTIVITAMIN) capsule Take 1 capsule by mouth daily.    [provider]  rosuvastatin (CRESTOR) 10 MG tablet Take 10  mg by mouth daily.    [provider]  sildenafil (REVATIO) 20 MG tablet Take 1 tablet (20 mg total) by mouth as needed. Take 1-5 tabs as needed prior to intercourse 01/10/18   Hollice Espy, MD  tamsulosin [facility]) 0.4 MG CAPS capsule Take 1 capsule (0.4 mg total) by mouth daily. 01/29/19   Hollice Espy, MD  vitamin C (ASCORBIC ACID) 500 MG tablet Take 500 mg by mouth daily.    [provider]    Allergies  Allergen Reactions   Cod [Fish Allergy]    Codeine Nausea And Vomiting   Ivp Dye [Iodinated Diagnostic Agents] Nausea And Vomiting    Also felt very hot/flushed   Pravastatin     Other reaction(s): Muscle Pain   Shellfish Allergy Nausea And Vomiting    Family History  Problem Relation Age of Onset   Alzheimer's disease Mother     Social History Social History   Tobacco Use   Smoking status: Never   Smokeless tobacco: Never  Vaping Use   Vaping Use: Never used  Substance Use Topics   Alcohol use: Yes    Alcohol/week: 14.0 standard drinks    Types: 14 Cans of beer per week    Comment:     Drug use: Not Currently    Types: Marijuana    Comment: occasional (01/12/17)-pt denies use    Review of Systems Constitutional: Negative for fever. Cardiovascular: Negative for chest pain. Respiratory: Negative for shortness of breath. Gastrointestinal: Negative for abdominal pain, vomiting  Musculoskeletal: Negative for musculoskeletal complaints Neurological: Negative for headache All other ROS negative  ____________________________________________   PHYSICAL EXAM:  VITAL SIGNS: ED Triage Vitals  Enc Vitals Group     BP 08/25/20 1131 (!) 141/91     Pulse Rate 08/25/20 1131 87     Resp 08/25/20 1131 18     Temp 08/25/20 1131 98.5 F (36.9 C)     Temp Source 08/25/20 1131 Oral     SpO2 08/25/20 1131 100 %     Weight 08/25/20 1132 210 lb (95.3 kg)     Height 08/25/20 1132 '5\' 10"'$  (1.778 m)     Head Circumference --      Peak Flow --      Pain  Score 08/25/20 1131 1     Pain Loc --      Pain Edu? --      Excl. in [place]? --    Constitutional: Alert and oriented. Well appearing and in no distress. Eyes: Normal exam ENT      Head: Normocephalic and atraumatic.      Mouth/Throat: Mucous membranes are moist. Cardiovascular: Normal rate, regular rhythm.  Respiratory: Normal respiratory effort without tachypnea nor retractions. Breath sounds are clear  Gastrointestinal: Soft and nontender. No distention.  Musculoskeletal: Nontender with normal range of motion in all extremities. Neurologic:  Normal speech and language. No gross focal neurologic deficits Skin:  Skin is warm, dry and intact.  Psychiatric: Mood and affect are normal.  ____________________________________________    EKG  EKG viewed and interpreted by myself shows a normal sinus rhythm at 79 bpm with a narrow QRS, normal axis, normal intervals, no concerning ST changes.  ____________________________________________    RADIOLOGY  Chest x-ray is negative  ____________________________________________   INITIAL IMPRESSION / ASSESSMENT AND PLAN / ED COURSE  Pertinent labs & imaging results that were available during my care of the patient were reviewed by me and considered in my medical decision making (see chart for details).   Patient presents to the emergency department for multiple symptoms states he has been experiencing intermittent shortness of breath chest discomfort insomnia increased anxiety and stress.  Patient is obviously going through a lot at home with his wife's treatment/diagnosis, worsening depression and anxiety, but no acute concern for SI/HI.  Patient's medical work-up thus far reassuring including labs, cardiac enzymes, chest x-ray, EKG.  We will check a COVID swab as a precaution.  If medical work-up is normal I believe a trial of a medication like hydroxyzine would be useful for the patient.  We will try 1 tablet of hydroxyzine in the emergency  department while awaiting results.  Troponin is negative.  COVID swab is still pending however patient wishes to be discharged as he needs to get back home.  We will discharge home and he will follow-up on his [place]. was evaluated in Emergency Department on 08/25/2020 for the symptoms described in the history of present illness. He was evaluated in the context of the global COVID-19 pandemic, which necessitated consideration that the patient might be at risk for infection with the SARS-CoV-2 virus that causes COVID-19. Institutional protocols and algorithms that pertain to the evaluation of patients at risk for COVID-19 are in a state of rapid change based on information released by regulatory bodies including the CDC and federal and state organizations. These policies and algorithms were followed during the patient's care in the ED. ____________________________________________  FINAL CLINICAL IMPRESSION(S) / ED DIAGNOSES  Dyspnea Anxiety Stress   Harvest Dark, MD 08/25/20 1322

## 2020-09-26 ENCOUNTER — Other Ambulatory Visit: Payer: Medicare HMO

## 2020-09-26 ENCOUNTER — Other Ambulatory Visit: Payer: Self-pay

## 2020-09-26 DIAGNOSIS — C61 Malignant neoplasm of prostate: Secondary | ICD-10-CM

## 2020-09-27 LAB — PSA: Prostate Specific Ag, Serum: 7.1 ng/mL — ABNORMAL HIGH (ref 0.0–4.0)

## 2020-09-29 NOTE — Progress Notes (Signed)
09/30/20 11:37 AM   Randall Flores. 01-18-1956 ED:[id number]  Referring provider:  Frazier Richards, [address],  [place] [address] Chief Complaint  Patient presents with   Prostate Cancer    Follow up     HPI: Randall Flores. is a 65 y.o.male with a history of prostate cancer who returns today for recheck on his PSA.   He has not been seen in over a year due to insurance issues.  He now has coverage.  He was diagnosed 09/2018 with prostate cancer. Pathology revealed 3 cores of Gleason 3+3 ranging up to 50% (left lateral base) of the tissue.  He has had a few cores of high-grade PIN and suspicious but nondiagnostic biopsy cores.   His TRUS volume on 06/28/2019 67.3 g.   His most recent PSA on 09/26/2020 was 7.1.  PSA trend as below.  He reports today that his wife is not doing well and has been taking care of her considering her doctors give her months to live.   He reports he has been experiencing frequency and urgency. He has been taking tamsulosin and experienced relief of his urinary symptoms but has ran out.  He has been out for about a month.  He does better when he is back on his medication.  He also has a personal history of erectile dysfunction but given health issues with his wife, is not interested in discussing this today.  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   Left, noted on CT renal    Hepatic hemangioma 02/03/2015   Small, noted on MRI Abd   History of colitis    History of kidney stones    Hypercholesteremia    Hypertension    Hypothyroidism    Major depression    Moderate aortic stenosis 05/24/2017   Noted on ECHO   Near syncope 04/25/2017   due to stress   OA (osteoarthritis)    knees, back, hands   Pre-diabetes    PVC (premature ventricular contraction)    Stroke ([place]) 2015   TIA/right side affected, right side has improved   Wears dentures    partial upper, front    Surgical History: Past Surgical History:  Procedure Laterality Date   [place], 2001   [place] L5-S1   COLONOSCOPY     [place]   COLONOSCOPY WITH PROPOFOL N/A 01/21/2017   Procedure: COLONOSCOPY WITH PROPOFOL;  Surgeon: [name], MD;  Location: [place] [facility];  Service: Endoscopy;  Laterality: N/A;   POLYPECTOMY  01/21/2017   Procedure: POLYPECTOMY;  Surgeon: [name], MD;  Location: [place];  Service: Endoscopy;;   TOTAL KNEE ARTHROPLASTY Right 04/07/2018   Procedure: TOTAL KNEE ARTHROPLASTY;  Surgeon: Earlie Server, MD;  Location: [facility];  Service: Orthopedics;  Laterality: Right;    Home Medications:  Allergies as of 09/30/2020       Reactions   Cod [fish Allergy]    Codeine Nausea And Vomiting   Ivp Dye [iodinated Diagnostic Agents] Nausea And Vomiting   Also felt very hot/flushed   Pravastatin    Other reaction(s): Muscle Pain   Shellfish Allergy Nausea And Vomiting        Medication List        Accurate as of September 30, 2020 11:37 AM. If you have any questions, ask your nurse or doctor.          STOP taking these medications    sildenafil 20 MG tablet Commonly known as: Revatio Stopped by: Hollice Espy, MD       TAKE these medications    acetaminophen 500 MG tablet Commonly known as: TYLENOL Take 1,000 mg by mouth every 8 (eight) hours as needed (pain).   citalopram 20 MG tablet Commonly known as:  CELEXA Take 20 mg by mouth daily.   DULoxetine 60 MG capsule Commonly known as: CYMBALTA Take 60 mg by mouth daily.   hydrocortisone cream 0.5 % Apply 1 application topically daily as needed for itching.   hydrOXYzine 25 MG tablet Commonly known as: ATARAX/VISTARIL Take 1 tablet (25 mg total) by mouth 3 (three) times daily as needed for anxiety.   levothyroxine 150 MCG tablet Commonly known as: SYNTHROID Take 150 mcg by mouth daily before breakfast.   lisinopril 10 MG tablet Commonly known as: ZESTRIL Take 10 mg by mouth daily.   multivitamin capsule Take 1 capsule by mouth daily.   rosuvastatin 10 MG tablet Commonly known as: CRESTOR Take 10 mg by mouth daily.   SALONPAS PAIN RELIEF PATCH EX Apply 1 patch topically daily as needed (pain).   tamsulosin 0.4 MG Caps capsule Commonly known as: FLOMAX Take 1 capsule (0.4 mg total) by mouth daily.   vitamin C 500 MG tablet Commonly known as: ASCORBIC ACID Take 500 mg by mouth daily.        Allergies:  Allergies  Allergen Reactions   Cod [Fish Allergy]    Codeine Nausea And Vomiting   Ivp Dye [Iodinated Diagnostic Agents] Nausea And Vomiting    Also felt very hot/flushed   Pravastatin     Other reaction(s): Muscle Pain   Shellfish Allergy Nausea And Vomiting    Family History: Family History  Problem Relation Age of Onset   Alzheimer's disease Mother     Social History:  reports that he has never smoked. He has never used smokeless tobacco. He reports current alcohol use of about 14.0 standard drinks per week. He reports that he does not currently use drugs after having used the following drugs: Marijuana.   Physical Exam: BP 129/81   Pulse 80   Ht '5\' 10"'$  (1.778 m)   Wt 210 lb (95.3 kg)   BMI 30.13 kg/m   Constitutional:  Alert and oriented, No acute distress. HEENT: [place] AT, moist mucus membranes.  Trachea midline, no masses. Cardiovascular: No clubbing, cyanosis, or edema. Respiratory: Normal  respiratory effort, no increased work of breathing. Rectal: Normal sphincter tone,  50  CC prostate, smooth no nodules, normal sphincter tone Skin: No rashes, bruises or suspicious lesions. Neurologic: Grossly intact, no focal deficits, moving all 4 extremities. Psychiatric: Normal mood and affect.  Laboratory Data:  Lab Results  Component Value Date   CREATININE 1.11 08/25/2020  Assessment & Plan:    History of Prostate cancer  - PSA has risen slightly from baseline but within a similar range - Rectal exam normal today  - Given his stressor will hold of on repeat biopsy or MRI at this time, repeat in 6 months with low threshold to pursue either of the aforementioned if his PSA continues to rise - PSA; future, will decide if MRI or repeat biopsy works best   BPH with urinary obstruction  - Ran out of Flomax which was helping his symptoms significantly  - Restart on Flomax 0.4 mg -May consider sensitive outlet procedure down the road but in light of social issues, we will hold off on continuing this discussion for the time being  3. ED  - Persistent but due to wife's illness he does not want to address this today     Return in about 6 months (around 03/30/2021) for 39mow/PSA prior.  IConley Rollsas a sEducation administratorfor AHollice Espy MD.,have documented all relevant documentation on the behalf of AHollice Espy MD,as directed by  AHollice Espy MD while in the presence of AHollice Espy MD.  I have reviewed the above documentation for accuracy and completeness, and I agree with the above.   AHollice Espy MD  [facility] [address] [place] [phone]

## 2020-09-30 ENCOUNTER — Other Ambulatory Visit: Payer: Self-pay

## 2020-09-30 ENCOUNTER — Ambulatory Visit (INDEPENDENT_AMBULATORY_CARE_PROVIDER_SITE_OTHER): Payer: Medicare HMO | Admitting: Urology

## 2020-09-30 VITALS — BP 129/81 | HR 80 | Ht 70.0 in | Wt 210.0 lb

## 2020-09-30 DIAGNOSIS — C61 Malignant neoplasm of prostate: Secondary | ICD-10-CM | POA: Diagnosis not present

## 2020-09-30 MED ORDER — TAMSULOSIN HCL 0.4 MG PO CAPS: 0.4000 mg | ORAL_CAPSULE | Freq: Every day | ORAL | 3 refills | Status: DC

## 2020-10-01 ENCOUNTER — Ambulatory Visit: Payer: Medicaid Other | Admitting: Gastroenterology

## 2020-11-12 ENCOUNTER — Encounter: Payer: Self-pay | Admitting: Gastroenterology

## 2020-11-12 ENCOUNTER — Ambulatory Visit (INDEPENDENT_AMBULATORY_CARE_PROVIDER_SITE_OTHER): Payer: Medicare HMO | Admitting: Gastroenterology

## 2020-11-12 VITALS — BP 162/79 | HR 71 | Temp 97.2°F | Ht 70.0 in | Wt 217.2 lb

## 2020-11-12 DIAGNOSIS — K7689 Other specified diseases of liver: Secondary | ICD-10-CM | POA: Diagnosis not present

## 2020-11-12 DIAGNOSIS — K769 Liver disease, unspecified: Secondary | ICD-10-CM | POA: Diagnosis not present

## 2020-11-12 NOTE — Progress Notes (Signed)
Primary Care Physician: Frazier Richards, MD  Primary Gastroenterologist:  Dr. [name]  Chief Complaint  Patient presents with   Liver cyst    HPI: Randall Flores. is a 65 y.o. male here for follow-up with a history of a hepatic cyst with a work-up at the [facility] clinic and imaging that showed lesion on ultrasound back in 2020 to be 5.7 cm.  The patient also had a colonoscopy with what appeared to be pseudopolyposis with the question of underlying asymptomatic inflammatory bowel disease.  The patient had a CEA and CA 19-9 sent off last time he saw me in 2020 with both of those being normal.  The cyst was reported to be likely benign on previous imaging although no biopsies or aspirations were done in the past.  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   Left, noted on CT renal   Hepatic hemangioma 02/03/2015   Small, noted on MRI Abd   History of colitis    History of kidney stones    Hypercholesteremia    Hypertension    Hypothyroidism    Major depression    Moderate aortic stenosis 05/24/2017   Noted on ECHO   Near syncope 04/25/2017   due to stress   OA (osteoarthritis)    knees, back, hands   Pre-diabetes    PVC (premature ventricular contraction)    Stroke ([place]) 2015   TIA/right side affected, right side has improved   Wears dentures    partial upper, front    Current Outpatient Medications  Medication Sig Dispense Refill   acetaminophen (TYLENOL) 500 MG tablet Take 1,000 mg by mouth every 8 (eight) hours as needed (pain).     citalopram (CELEXA) 20 MG tablet Take 20 mg by mouth daily.     DULoxetine (CYMBALTA) 60 MG  capsule Take 60 mg by mouth daily.     hydrocortisone cream 0.5 % Apply 1 application topically daily as needed for itching.     hydrOXYzine (ATARAX/VISTARIL) 25 MG tablet Take 1 tablet (25 mg total) by mouth 3 (three) times daily as needed for anxiety. 30 tablet 0   levothyroxine (SYNTHROID, LEVOTHROID) 150 MCG tablet Take 150 mcg by mouth daily before breakfast.      Liniments (SALONPAS PAIN RELIEF PATCH EX) Apply 1 patch topically daily as needed (pain).     lisinopril (PRINIVIL,ZESTRIL) 10 MG tablet Take 10 mg by mouth daily.      Multiple Vitamin (MULTIVITAMIN) capsule Take 1 capsule by mouth daily.     rosuvastatin (CRESTOR) 10 MG tablet Take 10 mg by mouth daily.     tamsulosin (FLOMAX) 0.4 MG CAPS capsule Take 1 capsule (0.4 mg total) by mouth daily. 90 capsule 3   vitamin C (ASCORBIC ACID) 500 MG tablet Take 500 mg by mouth daily.     No current facility-administered medications for this visit.    Allergies as of 11/12/2020 - Review Complete 11/12/2020  Allergen Reaction Noted   [place] [fish allergy]  09/09/2014   Codeine Nausea And Vomiting 09/09/2014  Ivp dye [iodinated diagnostic agents] Nausea And Vomiting 01/12/2017   Pravastatin  01/21/2015   Shellfish allergy Nausea And Vomiting 01/12/2017    ROS:  General: Negative for anorexia, weight loss, fever, chills, fatigue, weakness. ENT: Negative for hoarseness, difficulty swallowing , nasal congestion. CV: Negative for chest pain, angina, palpitations, dyspnea on exertion, peripheral edema.  Respiratory: Negative for dyspnea at rest, dyspnea on exertion, cough, sputum, wheezing.  GI: See history of present illness. GU:  Negative for dysuria, hematuria, urinary incontinence, urinary frequency, nocturnal urination.  Endo: Negative for unusual weight change.    Physical Examination:   BP (!) 162/79 (BP Location: Left Arm, Patient Position: Sitting, Cuff Size: Large)   Pulse 71   Temp (!) 97.2 F (36.2 C) (Temporal)   Ht 5'  10" (1.778 m)   Wt 217 lb 3.2 oz (98.5 kg)   BMI 31.16 kg/m   General: Well-nourished, well-developed in no acute distress.  Eyes: No icterus. Conjunctivae pink. Neuro: Alert and oriented x 3.  Grossly intact. Skin: Warm and dry, no jaundice.   Psych: Alert and cooperative, normal mood and affect.  Labs:    Imaging Studies: No results found.  Assessment and Plan:   Randall Flores. is a 65 y.o. y/o male who comes in with a history of a liver cyst.  The patient has been taking care of a terminally ill wife and has not been able to follow-up regularly.  The patient will be set up for an MRI to assess this liver lesion and will also be set up with a liver surgeon to have this lesion considered for removal.  The patient has been explained the plan and agrees with it.     [name], MD. [name]    Note: This dictation was prepared with Dragon dictation along with smaller phrase technology. Any transcriptional errors that result from this process are unintentional.

## 2020-11-13 ENCOUNTER — Other Ambulatory Visit: Payer: Self-pay

## 2020-11-20 ENCOUNTER — Other Ambulatory Visit: Payer: Self-pay

## 2020-11-20 MED ORDER — DIAZEPAM 10 MG PO TABS: ORAL_TABLET | ORAL | 0 refills | Status: DC

## 2020-11-25 ENCOUNTER — Ambulatory Visit
Admission: RE | Admit: 2020-11-25 | Discharge: 2020-11-25 | Disposition: A | Discharge status: Home / Self Care | Payer: Medicare HMO | Source: Ambulatory Visit | Attending: Gastroenterology | Admitting: Gastroenterology

## 2020-11-25 ENCOUNTER — Other Ambulatory Visit: Payer: Self-pay

## 2020-11-25 DIAGNOSIS — K769 Liver disease, unspecified: Secondary | ICD-10-CM | POA: Diagnosis not present

## 2020-11-25 MED ORDER — GADOBUTROL 1 MMOL/ML IV SOLN
10.0000 mL | Freq: Once | INTRAVENOUS | Status: AC | PRN
  Administered 2020-11-25: 10 mL via INTRAVENOUS

## 2020-11-28 ENCOUNTER — Telehealth: Payer: Self-pay

## 2020-11-28 NOTE — Telephone Encounter (Signed)
-----   Message from [name], MD sent at 11/25/2020  3:08 PM EDT ----- Let the patient know the liver lesion showed no change is size and showed some fatty liver. He should still follow up with the liver surgeon.

## 2020-11-28 NOTE — Telephone Encounter (Signed)
Pt notified of results

## 2020-12-16 ENCOUNTER — Other Ambulatory Visit: Payer: Self-pay

## 2021-01-28 ENCOUNTER — Encounter: Payer: Self-pay | Admitting: Emergency Medicine

## 2021-01-28 ENCOUNTER — Emergency Department: Payer: Medicare HMO

## 2021-01-28 ENCOUNTER — Other Ambulatory Visit: Payer: Self-pay

## 2021-01-28 ENCOUNTER — Emergency Department
Admission: EM | Admit: 2021-01-28 | Discharge: 2021-01-28 | Disposition: A | Discharge status: Home / Self Care | Payer: Medicare HMO | Attending: Emergency Medicine | Admitting: Emergency Medicine

## 2021-01-28 DIAGNOSIS — R079 Chest pain, unspecified: Secondary | ICD-10-CM | POA: Diagnosis present

## 2021-01-28 DIAGNOSIS — Z5321 Procedure and treatment not carried out due to patient leaving prior to being seen by health care provider: Secondary | ICD-10-CM | POA: Diagnosis not present

## 2021-01-28 DIAGNOSIS — R42 Dizziness and giddiness: Secondary | ICD-10-CM | POA: Diagnosis not present

## 2021-01-28 LAB — CBC
HCT: 40.4 % (ref 39.0–52.0)
Hemoglobin: 14 g/dL (ref 13.0–17.0)
MCH: 30.6 pg (ref 26.0–34.0)
MCHC: 34.7 g/dL (ref 30.0–36.0)
MCV: 88.2 fL (ref 80.0–100.0)
Platelets: 407 10*3/uL — ABNORMAL HIGH (ref 150–400)
RBC: 4.58 MIL/uL (ref 4.22–5.81)
RDW: 12.5 % (ref 11.5–15.5)
WBC: 8.6 10*3/uL (ref 4.0–10.5)
nRBC: 0.2 % (ref 0.0–0.2)

## 2021-01-28 LAB — BASIC METABOLIC PANEL
Anion gap: 7 (ref 5–15)
BUN: 14 mg/dL (ref 8–23)
CO2: 26 mmol/L (ref 22–32)
Calcium: 9.6 mg/dL (ref 8.9–10.3)
Chloride: 101 mmol/L (ref 98–111)
Creatinine, Ser: 1.05 mg/dL (ref 0.61–1.24)
GFR, Estimated: >60 mL/min (ref >60)
Glucose, Bld: 126 mg/dL — ABNORMAL HIGH (ref 70–99)
Potassium: 4.4 mmol/L (ref 3.5–5.1)
Sodium: 134 mmol/L — ABNORMAL LOW (ref 135–145)

## 2021-01-28 LAB — TROPONIN I (HIGH SENSITIVITY): Troponin I (High Sensitivity): 6 ng/L (ref <18)

## 2021-01-28 NOTE — ED Notes (Signed)
Pt called for recheck vitals and 2nd trop with no answer °

## 2021-01-28 NOTE — ED Triage Notes (Signed)
Pt comes into the ED via POV c/o left side chest pain that radiates into the left arm as well as having dizziness.  Pt states hes was seen yesterday where they did an EKG and told him he had had a heart attack and they put in a "urgent referral" to a cardiologist.  Pt states they cant get him in until next week and he is still having significant pain and dizziness.  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

## 2021-01-28 NOTE — ED Provider Triage Note (Cos[place] Needed)
Emergency Medicine Provider Triage Evaluation Note  Randall Flores. , a 66 y.o. male  was evaluated in triage.  Pt complains of left side chest pressure and dizziness. He went to [facility] yesterday and told that he had had a heart attack and placed an urgent referral to cardiology who can't see him until next week.  Review of Systems  Positive: Chest pressure, shortness of breath, dizziness Negative: Cough, nausea, vomiting  Physical Exam  There were no vitals taken for this visit. Gen:   Awake, no distress   Resp:  Normal effort  MSK:   Moves extremities without difficulty  Other:    Medical Decision Making  Medically screening exam initiated at 1:54 PM.  Appropriate orders placed.  Randall Flores. was informed that the remainder of the evaluation will be completed by another provider, this initial triage assessment does not replace that evaluation, and the importance of remaining in the ED until their evaluation is complete.  Cardiac protocol initiated in triage.   Victorino Dike, FNP 01/28/21 1400

## 2021-02-02 ENCOUNTER — Ambulatory Visit: Payer: Medicare HMO | Admitting: Cardiology

## 2021-02-02 ENCOUNTER — Other Ambulatory Visit: Payer: Self-pay

## 2021-02-02 ENCOUNTER — Encounter: Payer: Self-pay | Admitting: Cardiology

## 2021-02-02 VITALS — BP 135/83 | Ht 70.0 in | Wt 215.0 lb

## 2021-02-02 DIAGNOSIS — E7800 Pure hypercholesterolemia, unspecified: Secondary | ICD-10-CM | POA: Diagnosis not present

## 2021-02-02 DIAGNOSIS — R072 Precordial pain: Secondary | ICD-10-CM

## 2021-02-02 DIAGNOSIS — I10 Essential (primary) hypertension: Secondary | ICD-10-CM

## 2021-02-02 DIAGNOSIS — R42 Dizziness and giddiness: Secondary | ICD-10-CM | POA: Diagnosis not present

## 2021-02-02 NOTE — Patient Instructions (Signed)
Medication Instructions:   Your physician recommends that you continue on your current medications as directed. Please refer to the Current Medication list given to you today.  *If you need a refill on your cardiac medications before your next appointment, please call your pharmacy*   Lab Work:  None ordered  If you have labs (blood work) drawn today and your tests are completely normal, you will receive your results only by: [place] (if you have MyChart) OR A paper copy in the mail If you have any lab test that is abnormal or we need to change your treatment, we will call you to review the results.   Testing/Procedures:   Your physician has requested that you have an echocardiogram. Echocardiography is a painless test that uses sound waves to create images of your heart. It provides your doctor with information about the size and shape of your heart and how well your hearts chambers and valves are working. This procedure takes approximately one hour. There are no restrictions for this procedure.   2.  [place]      Your caregiver has ordered a Stress Test with nuclear imaging. The purpose of this test is to evaluate the blood supply to your heart muscle. This procedure is referred to as a "Non-Invasive Stress Test." This is because other than having an IV started in your vein, nothing is inserted or "invades" your body. Cardiac stress tests are done to find areas of poor blood flow to the heart by determining the extent of coronary artery disease (CAD). Some patients exercise on a treadmill, which naturally increases the blood flow to your heart, while others who are  unable to walk on a treadmill due to physical limitations have a pharmacologic/chemical stress agent called Lexiscan . This medicine will mimic walking on a treadmill by temporarily increasing your coronary blood flow.      PLEASE REPORT TO [facility]   THE VOLUNTEERS AT THE FIRST DESK WILL  DIRECT YOU WHERE TO GO     *Please note: these test may take anywhere between 2-4 hours to complete       Date of Procedure:_____________________________________   Arrival Time for Procedure:______________________________    PLEASE NOTIFY THE OFFICE AT LEAST 24 HOURS IN ADVANCE IF YOU ARE UNABLE TO KEEP YOUR APPOINTMENT.  [place] 24 HOURS IN ADVANCE IF YOU ARE UNABLE TO KEEP YOUR APPOINTMENT. [phone]         How to prepare for your Myoview test:    1. Do not eat or drink after midnight  2. No caffeine for 24 hours prior to test  3. No smoking 24 hours prior to test.  4. Unless instructed otherwise, Take your medication with a small sips of water.    5.         Ladies, please do not wear dresses. Skirts or pants are appropriate. Please wear a short sleeve shirt.  6. No perfume, cologne or lotion.  7. Wear comfortable walking shoes. No heels!     Follow-Up: At [facility], you and your health needs are our priority.  As part of our continuing mission to provide you with exceptional heart care, we have created designated Provider Care Teams.  These Care Teams include your primary Cardiologist (physician) and Advanced Practice Providers (APPs -  Physician Assistants and Nurse Practitioners) who all work together to provide you with the care you need, when you need it.  We recommend signing up for the patient portal called "MyChart".  Sign up information is provided on this After Visit Summary.  MyChart is used to connect with patients for Virtual Visits (Telemedicine).  Patients are able to view lab/test results, encounter notes, upcoming appointments, etc.  Non-urgent messages can be sent to your provider as well.   To learn more about what you can do with MyChart, go to [email or web address].    Your next appointment:   6-8 week(s)  The format for your next appointment:   In Person  Provider:   You may see  Kate Sable, MD or one of the following Advanced Practice Providers on your designated Care Team:   Murray Hodgkins, NP Christell Faith, PA-C Cadence Kathlen Mody, [place]    Other Instructions

## 2021-02-02 NOTE — Addendum Note (Signed)
Addended by: Kavin Leech on: 02/02/2021 04:28 PM   Modules accepted: Orders

## 2021-02-02 NOTE — Progress Notes (Addendum)
Cardiology Office Note:    Date:  02/02/2021   ID:  Randall Nash., DOB 11/22/1955, MRN [id number]  PCP:  Frazier Richards, MD   [facility] Providers Cardiologist:  None     Referring MD: Frazier Richards, MD   Chief Complaint  Patient presents with   New Patient (Initial Visit)    Referred by PCP for dizziness, pain in neck, and pressure on chest. Meds reviewed verbally with patient.     History of Present Illness:    Randall Coye. is a 66 y.o. male with a hx of hypertension, hyperlipidemia, anxiety who presents due to dizziness and chest pain.  Patient states being under a lot of stress.  His wife was recently diagnosed with metastatic breast cancer, she was given about 18 months to live.  Patient endorsed having chest pain which she describes as pressure, last occurrence yesterday while walking his dog.  He has a strong family history of heart attacks, with his dad having heart attack in his 53s, several cousins and uncles on the dad side have had heart attacks.  He saw primary care provider due to symptoms of chest discomfort about 5 days ago, he was told to go to the ED due to EKG abnormality.  In the ED, EKG on 01/28/2021 was normal, troponin was also normal.  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   Left, noted on CT renal   Hepatic hemangioma 02/03/2015   Small, noted on MRI Abd   History of colitis    History of kidney stones    Hypercholesteremia    Hypertension    Hypothyroidism    Major depression    Moderate aortic stenosis 05/24/2017   Noted on ECHO   Near syncope 04/25/2017   due to stress   OA (osteoarthritis)     knees, back, hands   Pre-diabetes    PVC (premature ventricular contraction)    Stroke ([place]) 2015   TIA/right side affected, right side has improved   Wears dentures    partial upper, front    Past Surgical History:  Procedure Laterality Date   [place], 2001   [place] L5-S1   COLONOSCOPY     [place]   COLONOSCOPY WITH PROPOFOL N/A 01/21/2017   Procedure: COLONOSCOPY WITH PROPOFOL;  Surgeon: [name], MD;  Location: [facility];  Service: Endoscopy;  Laterality: N/A;   POLYPECTOMY  01/21/2017   Procedure: POLYPECTOMY;  Surgeon: [name], MD;  Location: [facility];  Service: Endoscopy;;   TOTAL KNEE ARTHROPLASTY Right 04/07/2018   Procedure: TOTAL KNEE ARTHROPLASTY;  Surgeon: Earlie Server, MD;  Location: [facility];  Service: Orthopedics;  Laterality: Right;    Current Medications: Current Meds  Medication Sig   acetaminophen (TYLENOL) 500 MG tablet Take 1,000 mg by mouth every 8 (eight) hours as needed (pain).   citalopram (CELEXA) 20 MG tablet Take 20 mg by mouth daily.   DULoxetine (CYMBALTA) 60 MG capsule Take 60 mg by mouth daily.   hydrOXYzine (ATARAX/VISTARIL) 25 MG  tablet Take 1 tablet (25 mg total) by mouth 3 (three) times daily as needed for anxiety.   levothyroxine (SYNTHROID, LEVOTHROID) 150 MCG tablet Take 150 mcg by mouth daily before breakfast.    lisinopril (PRINIVIL,ZESTRIL) 10 MG tablet Take 10 mg by mouth daily.    Multiple Vitamin (MULTIVITAMIN) capsule Take 1 capsule by mouth daily.   rosuvastatin (CRESTOR) 10 MG tablet Take 10 mg by mouth daily.   tamsulosin (FLOMAX) 0.4 MG CAPS capsule Take 1 capsule (0.4 mg total) by mouth daily.     Allergies:   Cod [fish allergy], Codeine, Ivp dye [iodinated contrast media], Pravastatin, and Shellfish allergy   Social History   Socioeconomic History   Marital status: Married    Spouse name: Not on file   Number of children: Not on file   Years of education: Not on file    Highest education level: Not on file  Occupational History   Not on file  Tobacco Use   Smoking status: Never   Smokeless tobacco: Never  Vaping Use   Vaping Use: Never used  Substance and Sexual Activity   Alcohol use: Yes    Alcohol/week: 14.0 standard drinks    Types: 14 Cans of beer per week    Comment:     Drug use: Yes    Types: Marijuana    Comment: occasional (01/12/17)-pt denies use   Sexual activity: Not on file  Other Topics Concern   Not on file  Social History Narrative   Not on file   Social Determinants of Health   Financial Resource Strain: Not on file  Food Insecurity: Not on file  Transportation Needs: Not on file  Physical Activity: Not on file  Stress: Not on file  Social Connections: Not on file     Family History: The patient's family history includes Alzheimer's disease in his mother.  ROS:   Please see the history of present illness.     All other systems reviewed and are negative.  EKGs/Labs/Other Studies Reviewed:    The following studies were reviewed today:   EKG:  EKG is not ordered today.   Recent Labs: 01/28/2021: BUN 14; Creatinine, Ser 1.05; Hemoglobin 14.0; Platelets 407; Potassium 4.4; Sodium 134  Recent Lipid Panel    Component Value Date/Time   CHOL 175 06/28/2013 0624   TRIG 214 (H) 06/28/2013 0624   HDL 27 (L) 06/28/2013 0624   VLDL 43 (H) 06/28/2013 0624   LDLCALC 105 (H) 06/28/2013 0624   Outside lipid panel 01/28/2021 total cholesterol 137, HDL 37, LDL 78, triglycerides 124.  Risk Assessment/Calculations:         Physical Exam:    VS:  BP 135/83 (BP Location: Left Arm, Patient Position: Sitting, Cuff Size: Normal)    Ht 5\' 10"  (1.778 m)    Wt 215 lb (97.5 kg)    BMI 30.85 kg/m     Wt Readings from Last 3 Encounters:  02/02/21 215 lb (97.5 kg)  11/12/20 217 lb 3.2 oz (98.5 kg)  09/30/20 210 lb (95.3 kg)     GEN:  Well nourished, well developed in no acute distress HEENT: Normal NECK: No JVD; No carotid  bruits CARDIAC: RRR, 2/6 systolic murmur RESPIRATORY:  Clear to auscultation without rales, wheezing or rhonchi  ABDOMEN: Soft, non-tender, non-distended MUSCULOSKELETAL:  No edema; No deformity  SKIN: Warm and dry NEUROLOGIC:  Alert and oriented x 3 PSYCHIATRIC:  Normal affect   ASSESSMENT:    1. Precordial pain  2. Primary hypertension   3. Pure hypercholesterolemia   4. Dizziness    PLAN:    In order of problems listed above:  Chest pain, risk factors hypertension, hyperlipidemia, family history of early CAD.  Get echo, get Lexiscan Myoview.  Patient has iodine contrast allergies. Hypertension, BP controlled.  Continue lisinopril. Hyperlipidemia, cholesterol controlled, continue Crestor. Dizziness, associated with changing position although a component of orthostasis also noted on orthostatic vitals with BP staying normal.  Hydration recommended, advised to avoid abrupt changes in positioning.  Continue lisinopril as prescribed.  Follow-up in 2 months      Shared Decision Making/Informed Consent The risks [chest pain, shortness of breath, cardiac arrhythmias, dizziness, blood pressure fluctuations, myocardial infarction, stroke/transient ischemic attack, nausea, vomiting, allergic reaction, radiation exposure, metallic taste sensation and life-threatening complications (estimated to be 1 in 10,000)], benefits (risk stratification, diagnosing coronary artery disease, treatment guidance) and alternatives of a nuclear stress test were discussed in detail with Randall Flores and he agrees to proceed.    Medication Adjustments/Labs and Tests Ordered: Current medicines are reviewed at length with the patient today.  Concerns regarding medicines are outlined above.  Orders Placed This Encounter  Procedures   NM Myocar Multi W/Spect W/Wall Motion / EF   EKG 12-Lead   ECHOCARDIOGRAM COMPLETE   No orders of the defined types were placed in this encounter.   Patient Instructions   Medication Instructions:   Your physician recommends that you continue on your current medications as directed. Please refer to the Current Medication list given to you today.  *If you need a refill on your cardiac medications before your next appointment, please call your pharmacy*   Lab Work:  None ordered  If you have labs (blood work) drawn today and your tests are completely normal, you will receive your results only by: [place] (if you have MyChart) OR A paper copy in the mail If you have any lab test that is abnormal or we need to change your treatment, we will call you to review the results.   Testing/Procedures:   Your physician has requested that you have an echocardiogram. Echocardiography is a painless test that uses sound waves to create images of your heart. It provides your doctor with information about the size and shape of your heart and how well your hearts chambers and valves are working. This procedure takes approximately one hour. There are no restrictions for this procedure.   2.  [place]      Your caregiver has ordered a Stress Test with nuclear imaging. The purpose of this test is to evaluate the blood supply to your heart muscle. This procedure is referred to as a "Non-Invasive Stress Test." This is because other than having an IV started in your vein, nothing is inserted or "invades" your body. Cardiac stress tests are done to find areas of poor blood flow to the heart by determining the extent of coronary artery disease (CAD). Some patients exercise on a treadmill, which naturally increases the blood flow to your heart, while others who are  unable to walk on a treadmill due to physical limitations have a pharmacologic/chemical stress agent called Lexiscan . This medicine will mimic walking on a treadmill by temporarily increasing your coronary blood flow.      PLEASE REPORT TO [facility]ICAL [facility] ENTRANCE   THE VOLUNTEERS AT THE FIRST DESK WILL  DIRECT YOU WHERE TO GO     *Please note: these test may take anywhere between 2-4  hours to complete       Date of Procedure:_____________________________________   Arrival Time for Procedure:______________________________    PLEASE NOTIFY THE OFFICE AT LEAST 24 HOURS IN ADVANCE IF YOU ARE UNABLE TO KEEP YOUR APPOINTMENT.  [facility] 24 HOURS IN ADVANCE IF YOU ARE UNABLE TO KEEP YOUR APPOINTMENT. [phone]         How to prepare for your Myoview test:    1. Do not eat or drink after midnight  2. No caffeine for 24 hours prior to test  3. No smoking 24 hours prior to test.  4. Unless instructed otherwise, Take your medication with a small sips of water.    5.         Ladies, please do not wear dresses. Skirts or pants are appropriate. Please wear a short sleeve shirt.  6. No perfume, cologne or lotion.  7. Wear comfortable walking shoes. No heels!     Follow-Up: At [facility], you and your health needs are our priority.  As part of our continuing mission to provide you with exceptional heart care, we have created designated Provider Care Teams.  These Care Teams include your primary Cardiologist (physician) and Advanced Practice Providers (APPs -  Physician Assistants and Nurse Practitioners) who all work together to provide you with the care you need, when you need it.  We recommend signing up for the patient portal called "MyChart".  Sign up information is provided on this After Visit Summary.  MyChart is used to connect with patients for Virtual Visits (Telemedicine).  Patients are able to view lab/test results, encounter notes, upcoming appointments, etc.  Non-urgent messages can be sent to your provider as well.   To learn more about what you can do with MyChart, go to [email or web address].    Your next appointment:   6-8 week(s)  The format for your next appointment:   In Person  Provider:   You may see  Kate Sable, MD or one of the following Advanced Practice Providers on your designated Care Team:   Murray Hodgkins, NP Christell Faith, PA-C Cadence Kathlen Mody, [place]    Other Instructions     Signed, Kate Sable, MD  02/02/2021 4:24 PM    [place]

## 2021-02-09 NOTE — Addendum Note (Signed)
Addended by: Kate Sable on: 02/09/2021 04:58 PM   Modules accepted: Orders

## 2021-02-10 ENCOUNTER — Encounter
Admission: RE | Admit: 2021-02-10 | Discharge: 2021-02-10 | Disposition: A | Discharge status: Home / Self Care | Payer: Medicare HMO | Source: Ambulatory Visit | Attending: Cardiology | Admitting: Cardiology

## 2021-02-10 ENCOUNTER — Other Ambulatory Visit: Payer: Self-pay

## 2021-02-10 DIAGNOSIS — R072 Precordial pain: Secondary | ICD-10-CM | POA: Diagnosis not present

## 2021-02-10 LAB — NM MYOCAR MULTI W/SPECT W/WALL MOTION / EF
LV dias vol: 98 mL (ref 62–150)
LV sys vol: 40 mL
MPHR: 155 {beats}/min
Nuc Stress EF: 59 %
Peak HR: 87 {beats}/min
Percent HR: 56 %
Rest HR: 64 {beats}/min
Rest Nuclear Isotope Dose: 10.2 mCi
SDS: 3
SRS: 0
SSS: 3
ST Depression (mm): 0 mm
Stress Nuclear Isotope Dose: 32.5 mCi
TID: 1.13

## 2021-02-10 MED ORDER — TECHNETIUM TC 99M TETROFOSMIN IV KIT
10.0000 | PACK | Freq: Once | INTRAVENOUS | Status: AC | PRN
  Administered 2021-02-10: 10.2 via INTRAVENOUS

## 2021-02-10 MED ORDER — TECHNETIUM TC 99M TETROFOSMIN IV KIT
30.0000 | PACK | Freq: Once | INTRAVENOUS | Status: AC | PRN
  Administered 2021-02-10: 32.5 via INTRAVENOUS

## 2021-02-10 MED ORDER — REGADENOSON 0.4 MG/5ML IV SOLN
0.4000 mg | Freq: Once | INTRAVENOUS | Status: AC
  Administered 2021-02-10: 0.4 mg via INTRAVENOUS

## 2021-02-12 ENCOUNTER — Telehealth: Payer: Self-pay | Admitting: Emergency Medicine

## 2021-02-12 NOTE — Telephone Encounter (Signed)
-----   Message from Kate Sable, MD sent at 02/11/2021  5:25 PM EST ----- Normal stress test, no evidence for ischemia.

## 2021-02-12 NOTE — Telephone Encounter (Signed)
Patient called, results gone over.   Patient voiced appreciation for the call and understands that they can reach out to our office with any questions or concerns.

## 2021-02-27 ENCOUNTER — Other Ambulatory Visit: Payer: Self-pay

## 2021-02-27 ENCOUNTER — Ambulatory Visit (INDEPENDENT_AMBULATORY_CARE_PROVIDER_SITE_OTHER): Payer: Medicare HMO

## 2021-02-27 DIAGNOSIS — R072 Precordial pain: Secondary | ICD-10-CM | POA: Diagnosis not present

## 2021-02-27 LAB — ECHOCARDIOGRAM COMPLETE
AR max vel: 1.21 cm2
AV Area VTI: 1.15 cm2
AV Area mean vel: 1.08 cm2
AV Mean grad: 37.2 mmHg
AV Peak grad: 57.1 mmHg
Ao pk vel: 3.78 m/s
Area-P 1/2: 4.1 cm2
Calc EF: 59.4 %
S' Lateral: 2.8 cm
Single Plane A2C EF: 62.2 %
Single Plane A4C EF: 56.3 %

## 2021-03-02 ENCOUNTER — Telehealth: Payer: Self-pay

## 2021-03-02 NOTE — Telephone Encounter (Signed)
-----   Message from Kate Sable, MD sent at 03/01/2021  1:57 PM EST ----- Normal systolic function, aortic valve shows severe aortic stenosis.  Please schedule follow-up appointment with myself.  He will need left heart cath for aortic valve work-up. ( Mean gradient 41, peak gradient 66, vmax 4.66m/s)

## 2021-03-02 NOTE — Telephone Encounter (Signed)
The patient has been notified of the result and verbalized understanding.  All questions (if any) were answered. Patient is now scheduled for an appointment with Dr. Launa Grill on Friday 03/06/21. Kavin Leech, RN 03/02/2021 8:42 AM

## 2021-03-06 ENCOUNTER — Encounter: Payer: Self-pay | Admitting: Cardiology

## 2021-03-06 ENCOUNTER — Other Ambulatory Visit: Payer: Self-pay

## 2021-03-06 ENCOUNTER — Ambulatory Visit: Payer: Medicare HMO | Admitting: Cardiology

## 2021-03-06 VITALS — BP 130/70 | HR 73 | Ht 71.0 in | Wt 216.0 lb

## 2021-03-06 DIAGNOSIS — I10 Essential (primary) hypertension: Secondary | ICD-10-CM | POA: Diagnosis not present

## 2021-03-06 DIAGNOSIS — E7800 Pure hypercholesterolemia, unspecified: Secondary | ICD-10-CM | POA: Diagnosis not present

## 2021-03-06 DIAGNOSIS — R072 Precordial pain: Secondary | ICD-10-CM | POA: Diagnosis not present

## 2021-03-06 DIAGNOSIS — I350 Nonrheumatic aortic (valve) stenosis: Secondary | ICD-10-CM

## 2021-03-06 MED ORDER — PREDNISONE 50 MG PO TABS: ORAL_TABLET | ORAL | 0 refills | Status: DC

## 2021-03-06 NOTE — Progress Notes (Signed)
Cardiology Office Note:    Date:  03/06/2021   ID:  Randall Flores., DOB [birth year], MRN [id number]  PCP:  Frazier Richards, MD   [facility] Providers Cardiologist:  None     Referring MD: Frazier Richards, MD   Chief Complaint  Patient presents with   Other    ECHO follow up -- Needs cath for aortic valve work up. Meds reviewed verbally with patient.     History of Present Illness:    Randall Flores. is a 66 y.o. male with a hx of hypertension, hyperlipidemia, anxiety who presents for follow-up.  Previously seen due to dizziness and chest pain.  Echocardiogram and Lexiscan Myoview were ordered to evaluate cardiac function.  Patient under a lot of stress with wife diagnosed with metastatic breast cancer.  He presents for echo results.  States doing okay, no new concerns.  States feeling sick after obtaining iodine contrast for abdominal CT.  Was nauseous at the time.  Prior notes  he has a strong family history of heart attacks, with his dad having heart attack in his 71s, several cousins and uncles on the dad side have had heart attacks.   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   Left, noted on CT renal   Hepatic hemangioma 02/03/2015   Small, noted on MRI Abd   History of colitis    History of kidney stones    Hypercholesteremia    Hypertension    Hypothyroidism    Major depression    Moderate aortic stenosis 05/24/2017   Noted on ECHO   Near syncope 04/25/2017   due to stress   OA (osteoarthritis)    knees, back, hands   Pre-diabetes    PVC (premature ventricular contraction)    Stroke ([place]) 2015   TIA/right side  affected, right side has improved   Wears dentures    partial upper, front    Past Surgical History:  Procedure Laterality Date   [place], 2001   [place] L5-S1   COLONOSCOPY     [place]   COLONOSCOPY WITH PROPOFOL N/A 01/21/2017   Procedure: COLONOSCOPY WITH PROPOFOL;  Surgeon: [name], MD;  Location: [place];  Service: Endoscopy;  Laterality: N/A;   POLYPECTOMY  01/21/2017   Procedure: POLYPECTOMY;  Surgeon: [name], MD;  Location: [place];  Service: Endoscopy;;   TOTAL KNEE ARTHROPLASTY Right 04/07/2018   Procedure: TOTAL KNEE ARTHROPLASTY;  Surgeon: Earlie Server, MD;  Location: [facility];  Service: Orthopedics;  Laterality: Right;    Current Medications: Current Meds  Medication Sig   acetaminophen (TYLENOL) 500 MG tablet Take 1,000 mg by mouth every 8 (eight) hours as needed (pain).   citalopram (CELEXA) 20 MG tablet Take 20 mg by mouth daily.   DULoxetine (CYMBALTA) 60 MG capsule Take 60 mg by mouth daily.   hydrOXYzine (ATARAX/VISTARIL) 25 MG tablet Take 1 tablet (25 mg total) by mouth 3 (three) times daily as needed for anxiety.   levothyroxine (SYNTHROID, LEVOTHROID) 150 MCG tablet Take  150 mcg by mouth daily before breakfast.    lisinopril (PRINIVIL,ZESTRIL) 10 MG tablet Take 10 mg by mouth daily.    Multiple Vitamin (MULTIVITAMIN) capsule Take 1 capsule by mouth daily.   predniSONE (DELTASONE) 50 MG tablet Take 1 tab by mouth at 8pm on Mon. 03/09/21. Take 2nd tab by mouth at 2am on Tue 03/10/21. Take last tab prior to leaving home for your cath lab procedure.   rosuvastatin (CRESTOR) 10 MG tablet Take 10 mg by mouth daily.   tamsulosin (FLOMAX) 0.4 MG CAPS capsule Take 1 capsule (0.4 mg total) by mouth daily.     Allergies:   Codeine, Ivp dye [iodinated contrast media], Pravastatin, and Shellfish allergy   Social History   Socioeconomic History   Marital status: Married    Spouse name: Not on file   Number of children:  Not on file   Years of education: Not on file   Highest education level: Not on file  Occupational History   Not on file  Tobacco Use   Smoking status: Never   Smokeless tobacco: Never  Vaping Use   Vaping Use: Never used  Substance and Sexual Activity   Alcohol use: Yes    Alcohol/week: 14.0 standard drinks    Types: 14 Cans of beer per week    Comment:     Drug use: Yes    Types: Marijuana    Comment: occasional (01/12/17)-pt denies use   Sexual activity: Not on file  Other Topics Concern   Not on file  Social History Narrative   Not on file   Social Determinants of Health   Financial Resource Strain: Not on file  Food Insecurity: Not on file  Transportation Needs: Not on file  Physical Activity: Not on file  Stress: Not on file  Social Connections: Not on file     Family History: The patient's family history includes Alzheimer's disease in his mother.  ROS:   Please see the history of present illness.     All other systems reviewed and are negative.  EKGs/Labs/Other Studies Reviewed:    The following studies were reviewed today:   EKG:  EKG is ordered today.  EKG shows normal sinus rhythm, possible old inferior infarct  Recent Labs: 01/28/2021: BUN 14; Creatinine, Ser 1.05; Hemoglobin 14.0; Platelets 407; Potassium 4.4; Sodium 134  Recent Lipid Panel    Component Value Date/Time   CHOL 175 06/28/2013 0624   TRIG 214 (H) 06/28/2013 0624   HDL 27 (L) 06/28/2013 0624   VLDL 43 (H) 06/28/2013 0624   LDLCALC 105 (H) 06/28/2013 0624   Outside lipid panel 01/28/2021 total cholesterol 137, HDL 37, LDL 78, triglycerides 124.  Risk Assessment/Calculations:         Physical Exam:    VS:  BP 130/70 (BP Location: Left Arm, Patient Position: Sitting, Cuff Size: Normal)    Pulse 73    Ht 5\' 11"  (1.803 m)    Wt 216 lb (98 kg)    SpO2 98%    BMI 30.13 kg/m     Wt Readings from Last 3 Encounters:  03/06/21 216 lb (98 kg)  02/02/21 215 lb (97.5 kg)  11/12/20 217  lb 3.2 oz (98.5 kg)     GEN:  Well nourished, well developed in no acute distress HEENT: Normal NECK: No JVD; No carotid bruits CARDIAC: RRR, 2/6 systolic murmur RESPIRATORY:  Clear to auscultation without rales, wheezing or rhonchi  ABDOMEN: Soft, non-tender, non-distended MUSCULOSKELETAL:  No edema; No  deformity  SKIN: Warm and dry NEUROLOGIC:  Alert and oriented x 3 PSYCHIATRIC:  Normal affect   ASSESSMENT:    1. Precordial pain   2. Aortic valve stenosis, etiology of cardiac valve disease unspecified   3. Primary hypertension   4. Pure hypercholesterolemia     PLAN:    In order of problems listed above:  Chest pain, risk factors hypertension, hyperlipidemia, family history of early CAD.  Echocardiogram showed normal systolic function, EF 60 to 65%, severe aortic valve stenosis.  [place] with no significant ischemia.  Patient has iodine contrast allergies. Severe aortic valve stenosis, echo reviewed by myself, mean gradient 41, peak gradient 66, V-max 4.45m/s consistent with severe aortic stenosis.  Position of LVOT VTI was inaccurate.  Obtain limited echo to eval aortic valve.  Plan left heart cath, premedicate patient due to iodine allergies.  Refer to CT surgery. Hypertension, BP controlled.  Continue lisinopril. Hyperlipidemia, cholesterol controlled, continue Crestor.  Follow-up in 3 months     Shared Decision Making/Informed Consent The risks [stroke (1 in 1000), death (1 in 1000), kidney failure [usually temporary] (1 in 500), bleeding (1 in 200), allergic reaction [possibly serious] (1 in 200)], benefits (diagnostic support and management of coronary artery disease) and alternatives of a cardiac catheterization were discussed in detail with Mr. Deridder and he is willing to proceed.   Medication Adjustments/Labs and Tests Ordered: Current medicines are reviewed at length with the patient today.  Concerns regarding medicines are outlined above.  Orders Placed  This Encounter  Procedures   Basic metabolic panel   CBC   Ambulatory referral to Cardiothoracic Surgery   EKG 12-Lead   ECHOCARDIOGRAM COMPLETE   Meds ordered this encounter  Medications   predniSONE (DELTASONE) 50 MG tablet    Sig: Take 1 tab by mouth at 8pm on Mon. 03/09/21. Take 2nd tab by mouth at 2am on Tue 03/10/21. Take last tab prior to leaving home for your cath lab procedure.    Dispense:  3 tablet    Refill:  0    Patient Instructions  Medication Instructions:   Your physician recommends that you continue on your current medications as directed. Please refer to the Current Medication list given to you today.  *If you need a refill on your cardiac medications before your next appointment, please call your pharmacy*   Lab Work:  BMP, CBC to be drawn today   If you have labs (blood work) drawn today and your tests are completely normal, you will receive your results only by: [place] (if you have MyChart) OR A paper copy in the mail If you have any lab test that is abnormal or we need to change your treatment, we will call you to review the results.   Testing/Procedures:  Your physician has requested that you have a limited echocardiogram. Echocardiography is a painless test that uses sound waves to create images of your heart. It provides your doctor with information about the size and shape of your heart and how well your hearts chambers and valves are working. This procedure takes approximately one hour. There are no restrictions for this procedure.    Follow-Up: At [facility], you and your health needs are our priority.  As part of our continuing mission to provide you with exceptional heart care, we have created designated Provider Care Teams.  These Care Teams include your primary Cardiologist (physician) and Advanced Practice Providers (APPs -  Physician Assistants and Nurse Practitioners) who all work  together to provide you with the care you need,  when you need it.  We recommend signing up for the patient portal called "MyChart".  Sign up information is provided on this After Visit Summary.  MyChart is used to connect with patients for Virtual Visits (Telemedicine).  Patients are able to view lab/test results, encounter notes, upcoming appointments, etc.  Non-urgent messages can be sent to your provider as well.   To learn more about what you can do with MyChart, go to [email or web address].    Your next appointment:   3 month(s)  The format for your next appointment:   In Person  Provider:    ONLY WITH Kate Sable, MD    Other Instructions  You are scheduled for a Cardiac Catheterization on Tuesday, February 14 with Dr. Harrell Gave End.  1. Please arrive at the [facility] at 8:00 AM (This time is one hours before your procedure to ensure your preparation). Free valet parking service is available.   Special note: Every effort is made to have your procedure done on time. Please understand that emergencies sometimes delay scheduled procedures.  2. Diet: Do not eat solid foods after midnight.  The patient may have clear liquids until 5am upon the day of the procedure.  3. Labs: Drawn in office  4. Medication instructions in preparation for your procedure:  Contrast Allergy: Yes, Please take Prednisone 50mg  by mouth at: Thirteen hours prior to cath 8:00pm on Monday Seven hours prior to cath 2:00am on Tuesday And prior to leaving home please take last dose of Prednisone 50mg  and Benadryl 50mg  by mouth.   On the morning of your procedure, take an Aspirin 81 MG and all of your morning medications.  You may use sips of water.  5. Plan for one night stay--bring personal belongings. 6. Bring a current list of your medications and current insurance cards. 7. You MUST have a responsible person to drive you home. 8. Someone MUST be with you the first 24 hours after you arrive home or your discharge will be delayed. 9.  Please wear clothes that are easy to get on and off and wear slip-on shoes.  Thank you for allowing [place] to care for you!   -- [facility] Invasive Cardiovascular services     Signed, Kate Sable, MD  03/06/2021 1:07 PM    [place]

## 2021-03-06 NOTE — Addendum Note (Signed)
Addended by: Kate Sable on: 03/06/2021 03:58 PM   Modules accepted: Orders

## 2021-03-06 NOTE — H&P (View-Only) (Signed)
Cardiology Office Note:    Date:  03/06/2021   ID:  Randall Nash., DOB 1955-08-02, MRN [id number]  PCP:  Frazier Richards, MD   [facility] Providers Cardiologist:  None     Referring MD: Frazier Richards, MD   Chief Complaint  Patient presents with   Other    ECHO follow up -- Needs cath for aortic valve work up. Meds reviewed verbally with patient.     History of Present Illness:    Randall Veltre. is a 66 y.o. male with a hx of hypertension, hyperlipidemia, anxiety who presents for follow-up.  Previously seen due to dizziness and chest pain.  Echocardiogram and Lexiscan Myoview were ordered to evaluate cardiac function.  Patient under a lot of stress with wife diagnosed with metastatic breast cancer.  He presents for echo results.  States doing okay, no new concerns.  States feeling sick after obtaining iodine contrast for abdominal CT.  Was nauseous at the time.  Prior notes  he has a strong family history of heart attacks, with his dad having heart attack in his 84s, several cousins and uncles on the dad side have had heart attacks.   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   Left, noted on CT renal   Hepatic hemangioma 02/03/2015   Small, noted on MRI Abd   History of colitis    History of kidney stones    Hypercholesteremia    Hypertension    Hypothyroidism    Major depression    Moderate aortic stenosis 05/24/2017   Noted on ECHO   Near syncope 04/25/2017   due to stress   OA (osteoarthritis)    knees, back, hands   Pre-diabetes    PVC (premature ventricular contraction)    Stroke ([place]) 2015   TIA/right side  affected, right side has improved   Wears dentures    partial upper, front    Past Surgical History:  Procedure Laterality Date   [place], 2001   [place] L5-S1   COLONOSCOPY     [place]   COLONOSCOPY WITH PROPOFOL N/A 01/21/2017   Procedure: COLONOSCOPY WITH PROPOFOL;  Surgeon: [name], MD;  Location: [place];  Service: Endoscopy;  Laterality: N/A;   POLYPECTOMY  01/21/2017   Procedure: POLYPECTOMY;  Surgeon: [name], MD;  Location: [place];  Service: Endoscopy;;   TOTAL KNEE ARTHROPLASTY Right 04/07/2018   Procedure: TOTAL KNEE ARTHROPLASTY;  Surgeon: Earlie Server, MD;  Location: [facility];  Service: Orthopedics;  Laterality: Right;    Current Medications: Current Meds  Medication Sig   acetaminophen (TYLENOL) 500 MG tablet Take 1,000 mg by mouth every 8 (eight) hours as needed (pain).   citalopram (CELEXA) 20 MG tablet Take 20 mg by mouth daily.   DULoxetine (CYMBALTA) 60 MG capsule Take 60 mg by mouth daily.   hydrOXYzine (ATARAX/VISTARIL) 25 MG tablet Take 1 tablet (25 mg total) by mouth 3 (three) times daily as needed for anxiety.   levothyroxine (SYNTHROID, LEVOTHROID) 150 MCG tablet Take  150 mcg by mouth daily before breakfast.    lisinopril (PRINIVIL,ZESTRIL) 10 MG tablet Take 10 mg by mouth daily.    Multiple Vitamin (MULTIVITAMIN) capsule Take 1 capsule by mouth daily.   predniSONE (DELTASONE) 50 MG tablet Take 1 tab by mouth at 8pm on Mon. 03/09/21. Take 2nd tab by mouth at 2am on Tue 03/10/21. Take last tab prior to leaving home for your cath lab procedure.   rosuvastatin (CRESTOR) 10 MG tablet Take 10 mg by mouth daily.   tamsulosin (FLOMAX) 0.4 MG CAPS capsule Take 1 capsule (0.4 mg total) by mouth daily.     Allergies:   Codeine, Ivp dye [iodinated contrast media], Pravastatin, and Shellfish allergy   Social History   Socioeconomic History   Marital status: Married    Spouse name: Not on file   Number of children:  Not on file   Years of education: Not on file   Highest education level: Not on file  Occupational History   Not on file  Tobacco Use   Smoking status: Never   Smokeless tobacco: Never  Vaping Use   Vaping Use: Never used  Substance and Sexual Activity   Alcohol use: Yes    Alcohol/week: 14.0 standard drinks    Types: 14 Cans of beer per week    Comment:     Drug use: Yes    Types: Marijuana    Comment: occasional (01/12/17)-pt denies use   Sexual activity: Not on file  Other Topics Concern   Not on file  Social History Narrative   Not on file   Social Determinants of Health   Financial Resource Strain: Not on file  Food Insecurity: Not on file  Transportation Needs: Not on file  Physical Activity: Not on file  Stress: Not on file  Social Connections: Not on file     Family History: The patient's family history includes Alzheimer's disease in his mother.  ROS:   Please see the history of present illness.     All other systems reviewed and are negative.  EKGs/Labs/Other Studies Reviewed:    The following studies were reviewed today:   EKG:  EKG is ordered today.  EKG shows normal sinus rhythm, possible old inferior infarct  Recent Labs: 01/28/2021: BUN 14; Creatinine, Ser 1.05; Hemoglobin 14.0; Platelets 407; Potassium 4.4; Sodium 134  Recent Lipid Panel    Component Value Date/Time   CHOL 175 06/28/2013 0624   TRIG 214 (H) 06/28/2013 0624   HDL 27 (L) 06/28/2013 0624   VLDL 43 (H) 06/28/2013 0624   LDLCALC 105 (H) 06/28/2013 0624   Outside lipid panel 01/28/2021 total cholesterol 137, HDL 37, LDL 78, triglycerides 124.  Risk Assessment/Calculations:         Physical Exam:    VS:  BP 130/70 (BP Location: Left Arm, Patient Position: Sitting, Cuff Size: Normal)    Pulse 73    Ht 5\' 11"  (1.803 m)    Wt 216 lb (98 kg)    SpO2 98%    BMI 30.13 kg/m     Wt Readings from Last 3 Encounters:  03/06/21 216 lb (98 kg)  02/02/21 215 lb (97.5 kg)  11/12/20 217  lb 3.2 oz (98.5 kg)     GEN:  Well nourished, well developed in no acute distress HEENT: Normal NECK: No JVD; No carotid bruits CARDIAC: RRR, 2/6 systolic murmur RESPIRATORY:  Clear to auscultation without rales, wheezing or rhonchi  ABDOMEN: Soft, non-tender, non-distended MUSCULOSKELETAL:  No edema; No  deformity  SKIN: Warm and dry NEUROLOGIC:  Alert and oriented x 3 PSYCHIATRIC:  Normal affect   ASSESSMENT:    1. Precordial pain   2. Aortic valve stenosis, etiology of cardiac valve disease unspecified   3. Primary hypertension   4. Pure hypercholesterolemia     PLAN:    In order of problems listed above:  Chest pain, risk factors hypertension, hyperlipidemia, family history of early CAD.  Echocardiogram showed normal systolic function, EF 60 to 65%, severe aortic valve stenosis.  [place] with no significant ischemia.  Patient has iodine contrast allergies. Severe aortic valve stenosis, echo reviewed by myself, mean gradient 41, peak gradient 66, V-max 4.11m/s consistent with severe aortic stenosis.  Position of LVOT VTI was inaccurate.  Obtain limited echo to eval aortic valve.  Plan left heart cath, premedicate patient due to iodine allergies.  Refer to CT surgery. Hypertension, BP controlled.  Continue lisinopril. Hyperlipidemia, cholesterol controlled, continue Crestor.  Follow-up in 3 months     Shared Decision Making/Informed Consent The risks [stroke (1 in 1000), death (1 in 1000), kidney failure [usually temporary] (1 in 500), bleeding (1 in 200), allergic reaction [possibly serious] (1 in 200)], benefits (diagnostic support and management of coronary artery disease) and alternatives of a cardiac catheterization were discussed in detail with Mr. Cull and he is willing to proceed.   Medication Adjustments/Labs and Tests Ordered: Current medicines are reviewed at length with the patient today.  Concerns regarding medicines are outlined above.  Orders Placed  This Encounter  Procedures   Basic metabolic panel   CBC   Ambulatory referral to Cardiothoracic Surgery   EKG 12-Lead   ECHOCARDIOGRAM COMPLETE   Meds ordered this encounter  Medications   predniSONE (DELTASONE) 50 MG tablet    Sig: Take 1 tab by mouth at 8pm on Mon. 03/09/21. Take 2nd tab by mouth at 2am on Tue 03/10/21. Take last tab prior to leaving home for your cath lab procedure.    Dispense:  3 tablet    Refill:  0    Patient Instructions  Medication Instructions:   Your physician recommends that you continue on your current medications as directed. Please refer to the Current Medication list given to you today.  *If you need a refill on your cardiac medications before your next appointment, please call your pharmacy*   Lab Work:  BMP, CBC to be drawn today   If you have labs (blood work) drawn today and your tests are completely normal, you will receive your results only by: [place] (if you have MyChart) OR A paper copy in the mail If you have any lab test that is abnormal or we need to change your treatment, we will call you to review the results.   Testing/Procedures:  Your physician has requested that you have a limited echocardiogram. Echocardiography is a painless test that uses sound waves to create images of your heart. It provides your doctor with information about the size and shape of your heart and how well your hearts chambers and valves are working. This procedure takes approximately one hour. There are no restrictions for this procedure.    Follow-Up: At [facility], you and your health needs are our priority.  As part of our continuing mission to provide you with exceptional heart care, we have created designated Provider Care Teams.  These Care Teams include your primary Cardiologist (physician) and Advanced Practice Providers (APPs -  Physician Assistants and Nurse Practitioners) who all work  together to provide you with the care you need,  when you need it.  We recommend signing up for the patient portal called "MyChart".  Sign up information is provided on this After Visit Summary.  MyChart is used to connect with patients for Virtual Visits (Telemedicine).  Patients are able to view lab/test results, encounter notes, upcoming appointments, etc.  Non-urgent messages can be sent to your provider as well.   To learn more about what you can do with MyChart, go to [email or web address].    Your next appointment:   3 month(s)  The format for your next appointment:   In Person  Provider:    ONLY WITH Kate Sable, MD    Other Instructions  You are scheduled for a Cardiac Catheterization on Tuesday, February 14 with Dr. Harrell Gave End.  1. Please arrive at the [facility] at 8:00 AM (This time is one hours before your procedure to ensure your preparation). Free valet parking service is available.   Special note: Every effort is made to have your procedure done on time. Please understand that emergencies sometimes delay scheduled procedures.  2. Diet: Do not eat solid foods after midnight.  The patient may have clear liquids until 5am upon the day of the procedure.  3. Labs: Drawn in office  4. Medication instructions in preparation for your procedure:  Contrast Allergy: Yes, Please take Prednisone 50mg  by mouth at: Thirteen hours prior to cath 8:00pm on Monday Seven hours prior to cath 2:00am on Tuesday And prior to leaving home please take last dose of Prednisone 50mg  and Benadryl 50mg  by mouth.   On the morning of your procedure, take an Aspirin 81 MG and all of your morning medications.  You may use sips of water.  5. Plan for one night stay--bring personal belongings. 6. Bring a current list of your medications and current insurance cards. 7. You MUST have a responsible person to drive you home. 8. Someone MUST be with you the first 24 hours after you arrive home or your discharge will be delayed. 9.  Please wear clothes that are easy to get on and off and wear slip-on shoes.  Thank you for allowing [place] to care for you!   -- [facility] Invasive Cardiovascular services     Signed, Kate Sable, MD  03/06/2021 1:07 PM    [place]

## 2021-03-06 NOTE — Patient Instructions (Addendum)
Medication Instructions:   Your physician recommends that you continue on your current medications as directed. Please refer to the Current Medication list given to you today.  *If you need a refill on your cardiac medications before your next appointment, please call your pharmacy*   Lab Work:  BMP, CBC to be drawn today   If you have labs (blood work) drawn today and your tests are completely normal, you will receive your results only by: [place] (if you have MyChart) OR A paper copy in the mail If you have any lab test that is abnormal or we need to change your treatment, we will call you to review the results.   Testing/Procedures:  Your physician has requested that you have a limited echocardiogram. Echocardiography is a painless test that uses sound waves to create images of your heart. It provides your doctor with information about the size and shape of your heart and how well your hearts chambers and valves are working. This procedure takes approximately one hour. There are no restrictions for this procedure.    Follow-Up: At [facility], you and your health needs are our priority.  As part of our continuing mission to provide you with exceptional heart care, we have created designated Provider Care Teams.  These Care Teams include your primary Cardiologist (physician) and Advanced Practice Providers (APPs -  Physician Assistants and Nurse Practitioners) who all work together to provide you with the care you need, when you need it.  We recommend signing up for the patient portal called "MyChart".  Sign up information is provided on this After Visit Summary.  MyChart is used to connect with patients for Virtual Visits (Telemedicine).  Patients are able to view lab/test results, encounter notes, upcoming appointments, etc.  Non-urgent messages can be sent to your provider as well.   To learn more about what you can do with MyChart, go to [email or web address].    Your  next appointment:   3 month(s)  The format for your next appointment:   In Person  Provider:    ONLY WITH Kate Sable, MD    Other Instructions  You are scheduled for a Cardiac Catheterization on Tuesday, February 14 with Dr. Harrell Gave End.  1. Please arrive at the [facility] at 8:00 AM (This time is one hours before your procedure to ensure your preparation). Free valet parking service is available.   Special note: Every effort is made to have your procedure done on time. Please understand that emergencies sometimes delay scheduled procedures.  2. Diet: Do not eat solid foods after midnight.  The patient may have clear liquids until 5am upon the day of the procedure.  3. Labs: Drawn in office  4. Medication instructions in preparation for your procedure:  Contrast Allergy: Yes, Please take Prednisone 50mg  by mouth at: Thirteen hours prior to cath 8:00pm on Monday Seven hours prior to cath 2:00am on Tuesday And prior to leaving home please take last dose of Prednisone 50mg  and Benadryl 50mg  by mouth.   On the morning of your procedure, take an Aspirin 81 MG and all of your morning medications.  You may use sips of water.  5. Plan for one night stay--bring personal belongings. 6. Bring a current list of your medications and current insurance cards. 7. You MUST have a responsible person to drive you home. 8. Someone MUST be with you the first 24 hours after you arrive home or your discharge will be delayed. 9. Please wear clothes that  are easy to get on and off and wear slip-on shoes.  Thank you for allowing [place] to care for you!   -- [facility]

## 2021-03-07 LAB — CBC
Hematocrit: 38.9 % (ref 37.5–51.0)
Hemoglobin: 13.3 g/dL (ref 13.0–17.7)
MCH: 30.2 pg (ref 26.6–33.0)
MCHC: 34.2 g/dL (ref 31.5–35.7)
MCV: 88 fL (ref 79–97)
Platelets: 293 10*3/uL (ref 150–450)
RBC: 4.4 x10E6/uL (ref 4.14–5.80)
RDW: 12.9 % (ref 11.6–15.4)
WBC: 6.2 10*3/uL (ref 3.4–10.8)

## 2021-03-07 LAB — BASIC METABOLIC PANEL
BUN/Creatinine Ratio: 11 (ref 10–24)
BUN: 11 mg/dL (ref 8–27)
CO2: 22 mmol/L (ref 20–29)
Calcium: 9.4 mg/dL (ref 8.6–10.2)
Chloride: 99 mmol/L (ref 96–106)
Creatinine, Ser: 0.96 mg/dL (ref 0.76–1.27)
Glucose: 110 mg/dL — ABNORMAL HIGH (ref 70–99)
Potassium: 4.6 mmol/L (ref 3.5–5.2)
Sodium: 137 mmol/L (ref 134–144)
eGFR: 88 mL/min/{1.73_m2} (ref >59)

## 2021-03-09 ENCOUNTER — Other Ambulatory Visit: Payer: Self-pay

## 2021-03-09 ENCOUNTER — Telehealth: Payer: Self-pay

## 2021-03-09 ENCOUNTER — Ambulatory Visit
Admission: RE | Admit: 2021-03-09 | Discharge: 2021-03-09 | Disposition: A | Discharge status: Home / Self Care | Payer: Medicare HMO | Source: Ambulatory Visit | Attending: Cardiology | Admitting: Cardiology

## 2021-03-09 DIAGNOSIS — I10 Essential (primary) hypertension: Secondary | ICD-10-CM | POA: Diagnosis not present

## 2021-03-09 DIAGNOSIS — I350 Nonrheumatic aortic (valve) stenosis: Secondary | ICD-10-CM | POA: Insufficient documentation

## 2021-03-09 LAB — ECHOCARDIOGRAM COMPLETE
AR max vel: 0.69 cm2
AV Area VTI: 0.64 cm2
AV Area mean vel: 0.65 cm2
AV Mean grad: 39.7 mmHg
AV Peak grad: 57.4 mmHg
Ao pk vel: 3.79 m/s
Area-P 1/2: 2.96 cm2
MV VTI: 2.21 cm2
S' Lateral: 3.2 cm

## 2021-03-09 NOTE — Progress Notes (Signed)
*  PRELIMINARY RESULTS* Echocardiogram 2D Echocardiogram has been performed.  Randall Flores 03/09/2021, 9:54 AM

## 2021-03-09 NOTE — Telephone Encounter (Signed)
Called Elnita Maxwell the referral coordinator that scheduled patients referral to [facility] and left a VM requesting a call back to discuss the urgent need for patient to be seen ASAP for Aortic Valve replacement. Patient was scheduled for 04/29/2021. Dr. Launa Grill is advising that the patient be soon ASAP. Patient had a second Echo today confirming severe aortic stenosis, and is also scheduled for a cardiac cath to evaluate aortic valve tomorrow 03/10/2021.

## 2021-03-10 ENCOUNTER — Encounter
Admission: RE | Disposition: A | Discharge status: Home / Self Care | Payer: Self-pay | Source: Home / Self Care | Attending: Internal Medicine

## 2021-03-10 ENCOUNTER — Encounter: Payer: Self-pay | Admitting: Internal Medicine

## 2021-03-10 ENCOUNTER — Ambulatory Visit
Admission: RE | Admit: 2021-03-10 | Discharge: 2021-03-10 | Disposition: A | Discharge status: Home / Self Care | Payer: Medicare HMO | Attending: Internal Medicine | Admitting: Internal Medicine

## 2021-03-10 ENCOUNTER — Other Ambulatory Visit: Payer: Self-pay

## 2021-03-10 DIAGNOSIS — I350 Nonrheumatic aortic (valve) stenosis: Secondary | ICD-10-CM

## 2021-03-10 DIAGNOSIS — Z8249 Family history of ischemic heart disease and other diseases of the circulatory system: Secondary | ICD-10-CM | POA: Diagnosis not present

## 2021-03-10 DIAGNOSIS — I2510 Atherosclerotic heart disease of native coronary artery without angina pectoris: Secondary | ICD-10-CM | POA: Insufficient documentation

## 2021-03-10 DIAGNOSIS — I110 Hypertensive heart disease with heart failure: Secondary | ICD-10-CM | POA: Diagnosis not present

## 2021-03-10 DIAGNOSIS — E7800 Pure hypercholesterolemia, unspecified: Secondary | ICD-10-CM | POA: Diagnosis not present

## 2021-03-10 DIAGNOSIS — I5032 Chronic diastolic (congestive) heart failure: Secondary | ICD-10-CM | POA: Insufficient documentation

## 2021-03-10 DIAGNOSIS — F419 Anxiety disorder, unspecified: Secondary | ICD-10-CM | POA: Diagnosis not present

## 2021-03-10 DIAGNOSIS — R072 Precordial pain: Secondary | ICD-10-CM

## 2021-03-10 HISTORY — PX: RIGHT HEART CATH AND CORONARY ANGIOGRAPHY: CATH118264

## 2021-03-10 SURGERY — RIGHT HEART CATH AND CORONARY ANGIOGRAPHY
Anesthesia: Moderate Sedation

## 2021-03-10 MED ORDER — LABETALOL HCL 5 MG/ML IV SOLN: 10.0000 mg | INTRAVENOUS | Status: DC | PRN

## 2021-03-10 MED ORDER — SODIUM CHLORIDE 0.9 % WEIGHT BASED INFUSION: 1.0000 mL/kg/h | INTRAVENOUS | Status: DC

## 2021-03-10 MED ORDER — SODIUM CHLORIDE 0.9 % IV SOLN: 250.0000 mL | INTRAVENOUS | Status: DC | PRN

## 2021-03-10 MED ORDER — MIDAZOLAM HCL 2 MG/2ML IJ SOLN
INTRAMUSCULAR | Status: AC
  Filled 2021-03-10: qty 2

## 2021-03-10 MED ORDER — HEPARIN (PORCINE) IN NACL 1000-0.9 UT/500ML-% IV SOLN
INTRAVENOUS | Status: DC | PRN
  Administered 2021-03-10: 1000 mL

## 2021-03-10 MED ORDER — MIDAZOLAM HCL 2 MG/2ML IJ SOLN
INTRAMUSCULAR | Status: DC | PRN
  Administered 2021-03-10 (×2): 1 mg via INTRAVENOUS

## 2021-03-10 MED ORDER — VERAPAMIL HCL 2.5 MG/ML IV SOLN
INTRAVENOUS | Status: DC | PRN
  Administered 2021-03-10: 2.5 mg via INTRA_ARTERIAL

## 2021-03-10 MED ORDER — SODIUM CHLORIDE 0.9% FLUSH: 3.0000 mL | Freq: Two times a day (BID) | INTRAVENOUS | Status: DC

## 2021-03-10 MED ORDER — ACETAMINOPHEN 325 MG PO TABS: 650.0000 mg | ORAL_TABLET | ORAL | Status: DC | PRN

## 2021-03-10 MED ORDER — ONDANSETRON HCL 4 MG/2ML IJ SOLN: 4.0000 mg | Freq: Four times a day (QID) | INTRAMUSCULAR | Status: DC | PRN

## 2021-03-10 MED ORDER — HYDRALAZINE HCL 20 MG/ML IJ SOLN: 10.0000 mg | INTRAMUSCULAR | Status: DC | PRN

## 2021-03-10 MED ORDER — SODIUM CHLORIDE 0.9% FLUSH: 3.0000 mL | INTRAVENOUS | Status: DC | PRN

## 2021-03-10 MED ORDER — FENTANYL CITRATE (PF) 100 MCG/2ML IJ SOLN
INTRAMUSCULAR | Status: DC | PRN
  Administered 2021-03-10 (×2): 25 ug via INTRAVENOUS

## 2021-03-10 MED ORDER — SODIUM CHLORIDE 0.9 % WEIGHT BASED INFUSION
3.0000 mL/kg/h | INTRAVENOUS | Status: AC
  Administered 2021-03-10: 3 mL/kg/h via INTRAVENOUS

## 2021-03-10 MED ORDER — HEPARIN (PORCINE) IN NACL 1000-0.9 UT/500ML-% IV SOLN
INTRAVENOUS | Status: AC
  Filled 2021-03-10: qty 1000

## 2021-03-10 MED ORDER — HEPARIN SODIUM (PORCINE) 1000 UNIT/ML IJ SOLN
INTRAMUSCULAR | Status: DC | PRN
  Administered 2021-03-10: 5000 [IU] via INTRAVENOUS

## 2021-03-10 MED ORDER — VERAPAMIL HCL 2.5 MG/ML IV SOLN
INTRAVENOUS | Status: AC
  Filled 2021-03-10: qty 2

## 2021-03-10 MED ORDER — FUROSEMIDE 20 MG PO TABS: 20.0000 mg | ORAL_TABLET | Freq: Every day | ORAL | 5 refills | Status: DC

## 2021-03-10 MED ORDER — IOHEXOL 300 MG/ML  SOLN
INTRAMUSCULAR | Status: DC | PRN
  Administered 2021-03-10: 88 mL

## 2021-03-10 MED ORDER — FENTANYL CITRATE (PF) 100 MCG/2ML IJ SOLN
INTRAMUSCULAR | Status: AC
  Filled 2021-03-10: qty 2

## 2021-03-10 MED ORDER — HEPARIN SODIUM (PORCINE) 1000 UNIT/ML IJ SOLN
INTRAMUSCULAR | Status: AC
  Filled 2021-03-10: qty 10

## 2021-03-10 SURGICAL SUPPLY — 15 items
CATH 5F 110X4 TIG (CATHETERS) ×1 IMPLANT
CATH AMP RT 5F (CATHETERS) ×1 IMPLANT
CATH BALLN WEDGE 5F 110CM (CATHETERS) ×1 IMPLANT
CATH INFINITI 5 FR 3DRC (CATHETERS) ×1 IMPLANT
CATH INFINITI 5FR AL1 (CATHETERS) ×1 IMPLANT
DEVICE RAD TR BAND REGULAR (VASCULAR PRODUCTS) ×1 IMPLANT
DRAPE BRACHIAL (DRAPES) ×2 IMPLANT
GLIDESHEATH SLEND SS 6F .021 (SHEATH) ×1 IMPLANT
GUIDEWIRE INQWIRE 1.5J.035X260 (WIRE) IMPLANT
INQWIRE 1.5J .035X260CM (WIRE) ×2
PACK CARDIAC CATH (CUSTOM PROCEDURE TRAY) ×2 IMPLANT
PROTECTION STATION PRESSURIZED (MISCELLANEOUS) ×2
SET ATX SIMPLICITY (MISCELLANEOUS) ×1 IMPLANT
SHEATH GLIDE SLENDER 4/5FR (SHEATH) ×1 IMPLANT
STATION PROTECTION PRESSURIZED (MISCELLANEOUS) IMPLANT

## 2021-03-10 NOTE — Interval H&P Note (Signed)
History and Physical Interval Note:  03/10/2021 8:53 AM  Randall Flores.  has presented today for surgery, with the diagnosis of severe aortic stenosis.  The various methods of treatment have been discussed with the patient and family. After consideration of risks, benefits and other options for treatment, the patient has consented to  Procedure(s): RIGHT/LEFT HEART CATH AND CORONARY ANGIOGRAPHY (N/A) as a surgical intervention.  The patient's history has been reviewed, patient examined, no change in status, stable for surgery.  I have reviewed the patient's chart and labs.  Questions were answered to the patient's satisfaction.    Cath Lab Visit (complete for each Cath Lab visit)  Clinical Evaluation Leading to the Procedure:   ACS: No.  Non-ACS:    Anginal Classification: CCS III  Anti-ischemic medical therapy: No Therapy  Non-Invasive Test Results: Low-risk stress test findings: cardiac mortality <1%/year  Prior CABG: No previous CABG  Randall Flores

## 2021-03-10 NOTE — Telephone Encounter (Signed)
Patient has been rescheduled for 03/20/21. Closing encounter.

## 2021-03-11 ENCOUNTER — Encounter: Payer: Self-pay | Admitting: Internal Medicine

## 2021-03-17 ENCOUNTER — Encounter: Payer: Medicare HMO | Admitting: Surgery

## 2021-03-19 ENCOUNTER — Other Ambulatory Visit: Payer: Self-pay

## 2021-03-19 ENCOUNTER — Encounter: Payer: Self-pay | Admitting: *Deleted

## 2021-03-19 ENCOUNTER — Other Ambulatory Visit: Payer: Self-pay | Admitting: *Deleted

## 2021-03-19 ENCOUNTER — Institutional Professional Consult (permissible substitution): Payer: Medicare HMO | Admitting: Surgery

## 2021-03-19 ENCOUNTER — Encounter: Payer: Self-pay | Admitting: Surgery

## 2021-03-19 VITALS — BP 108/68 | HR 80 | Resp 20 | Ht 71.0 in | Wt 215.0 lb

## 2021-03-19 DIAGNOSIS — I350 Nonrheumatic aortic (valve) stenosis: Secondary | ICD-10-CM

## 2021-03-19 NOTE — Progress Notes (Signed)
Cardiothoracic Surgery Consultation   PCP is Flinchum, Kelby Aline, FNP Referring Provider is Kate Sable, MD  Chief Complaint  Patient presents with   Aortic Stenosis    New patient consultation ECHO 2/13, 2/14 CATH 2/14    HPI:  The patient is a 66 year old gentleman with a history of hypertension, hyperlipidemia, hypothyroidism, prediabetes, anxiety and depression, and bicuspid aortic valve stenosis.  An echocardiogram on 03/09/2021 showed a bicuspid aortic valve with a mean gradient of 40 mmHg and a valve area of 0.64 cm.  Left ventricular ejection fraction was 60% with a stroke-volume index of 26.  Cardiac catheterization showed no significant coronary disease with mildly elevated left heart filling pressures.  He reports progressive exertional fatigue and shortness of breath as well as some dizziness.  He has had no syncope.  He has had some exertional left-sided chest discomfort radiating into his left arm and up into his neck on both sides.  He denies orthopnea.  He has had mild ankle edema at times.  The patient lives at home with his wife and is retired.  His wife has metastatic breast cancer with diffuse metastases including brain and is undergoing intensified chemotherapy at [facility].  He provides most of her care at home. Past Medical History:  Diagnosis Date   Anxiety    BPH with obstruction/lower urinary tract symptoms    Colon polyp    Multiple   Complication of anesthesia    wakes up during anesthesia   DDD (degenerative disc disease), lumbar    Diverticulitis    Diverticulosis    Elevated PSA    Fatty liver 01/22/2015   Mild, noted on [place] Abd   Hepatic cyst 06/08/2016   Left, noted on CT renal   Hepatic hemangioma 02/03/2015   Small, noted on MRI Abd   History of colitis    History of kidney stones    Hypercholesteremia    Hypertension    Hypothyroidism    Major depression    Moderate aortic stenosis 05/24/2017   Noted on ECHO   Near syncope 04/25/2017    due to stress   OA (osteoarthritis)    knees, back, hands   Pre-diabetes    PVC (premature ventricular contraction)    Stroke ([place]) 2015   TIA/right side affected, right side has improved   Wears dentures    partial upper, front    Past Surgical History:  Procedure Laterality Date   [place], 2001   [place] L5-S1   COLONOSCOPY     [place]   COLONOSCOPY WITH PROPOFOL N/A 01/21/2017   Procedure: COLONOSCOPY WITH PROPOFOL;  Surgeon: [name], MD;  Location: [place];  Service: Endoscopy;  Laterality: N/A;   POLYPECTOMY  01/21/2017   Procedure: POLYPECTOMY;  Surgeon: [name], MD;  Location: [place];  Service: Endoscopy;;   RIGHT HEART CATH AND CORONARY ANGIOGRAPHY N/A 03/10/2021   Procedure: RIGHT HEART CATH AND CORONARY ANGIOGRAPHY;  Surgeon: Nelva Bush, MD;  Location: [facility];  Service: Cardiovascular;  Laterality: N/A;   TOTAL KNEE ARTHROPLASTY Right 04/07/2018   Procedure: TOTAL KNEE ARTHROPLASTY;  Surgeon: Earlie Server, MD;  Location: [facility];  Service: Orthopedics;  Laterality: Right;    Family History  Problem Relation Age of Onset   Alzheimer's disease Mother     Social History Social History   Tobacco Use   Smoking status: Never   Smokeless tobacco: Never  Vaping Use   Vaping Use: Never used  Substance Use Topics   Alcohol use: Yes    Alcohol/week: 14.0 standard drinks    Types: 14 Cans of beer per week    Comment:     Drug use: Yes    Types: Marijuana    Comment: had beg. of Feb. 2023    Current Outpatient Medications  Medication Sig Dispense Refill   acetaminophen (TYLENOL) 500 MG tablet Take 1,000 mg by mouth every 8 (eight) hours as needed (pain).     cholecalciferol (VITAMIN D3) 25 MCG (1000 UNIT) tablet Take 1,000 Units by mouth daily.     citalopram (CELEXA) 20 MG tablet Take 20 mg by mouth daily.     DULoxetine (CYMBALTA) 60 MG capsule Take 60 mg by mouth daily.     furosemide  (LASIX) 20 MG tablet Take 1 tablet (20 mg total) by mouth daily. 30 tablet 5   hydrOXYzine (ATARAX/VISTARIL) 25 MG tablet Take 1 tablet (25 mg total) by mouth 3 (three) times daily as needed for anxiety. 30 tablet 0   levothyroxine (SYNTHROID, LEVOTHROID) 150 MCG tablet Take 150 mcg by mouth daily before breakfast.      lisinopril (PRINIVIL,ZESTRIL) 10 MG tablet Take 10 mg by mouth daily.      Multiple Vitamin (MULTIVITAMIN) capsule Take 1 capsule by mouth daily.     rosuvastatin (CRESTOR) 10 MG tablet Take 10 mg by mouth daily.     tamsulosin (FLOMAX) 0.4 MG CAPS capsule Take 1 capsule (0.4 mg total) by mouth daily. 90 capsule 3   No current facility-administered medications for this visit.    Allergies  Allergen Reactions   Codeine Nausea And Vomiting   Ivp Dye [Iodinated Contrast Media] Nausea And Vomiting    Also felt very hot/flushed   Pravastatin     Other reaction(s): Muscle Pain   Shellfish Allergy Nausea And Vomiting    Review of Systems  Constitutional:  Positive for activity change and fatigue. Negative for chills, fever and unexpected weight change.  HENT:         Saw his dentist 6 months ago and needs to have some fillings.  Has partial dentures  Eyes: Negative.   Respiratory:  Positive for shortness of breath.   Cardiovascular:  Positive for chest pain and leg swelling.  Gastrointestinal: Negative.   Endocrine: Negative.   Genitourinary: Negative.        BPH  Musculoskeletal:  Positive for arthralgias and myalgias.  Skin: Negative.   Allergic/Immunologic: Negative.   Neurological:  Positive for dizziness. Negative for syncope.  Hematological: Negative.   Psychiatric/Behavioral:  The patient is nervous/anxious.        Depression   BP 108/68 (BP Location: Left Arm, Patient Position: Sitting, Cuff Size: Normal)    Pulse 80    Resp 20    Ht 5\' 11"  (1.803 m)    Wt 215 lb (97.5 kg)    SpO2 96% Comment: RA   BMI 29.99 kg/m  Physical Exam Constitutional:       Appearance: Normal appearance. He is normal weight.  HENT:     Head: Normocephalic and atraumatic.  Eyes:     Extraocular Movements: Extraocular movements intact.     Conjunctiva/sclera: Conjunctivae normal.     Pupils: Pupils are equal, round, and reactive to light.  Neck:     Vascular: No carotid bruit.  Cardiovascular:     Rate and Rhythm: Normal rate and regular rhythm.     Pulses: Normal pulses.     Heart sounds: Murmur  heard.     Comments: 3/6 systolic murmur along the right sternal border.  There is no diastolic murmur. Pulmonary:     Effort: Pulmonary effort is normal.     Breath sounds: Normal breath sounds.  Abdominal:     General: There is no distension.     Tenderness: There is no abdominal tenderness.  Musculoskeletal:        General: No swelling. Normal range of motion.     Cervical back: Normal range of motion and neck supple.  Skin:    General: Skin is warm and dry.  Neurological:     General: No focal deficit present.     Mental Status: He is alert and oriented to person, place, and time.  Psychiatric:        Mood and Affect: Mood normal.        Behavior: Behavior normal.     Diagnostic Tests:      ECHOCARDIOGRAM REPORT         Patient Name:   Randall Flores. Date of Exam: 03/09/2021  Medical Rec #:  [id number]           Height:       71.0 in  Accession #:    [id number]          Weight:       216.0 lb  Date of Birth:  1955/03/29           BSA:          2.179 m  Patient Age:    82 years            BP:           130/70 mmHg  Patient Gender: M                   HR:           73 bpm.  Exam Location:  [facility]   Procedure: 2D Echo, Cardiac Doppler and Color Doppler   Indications:     I35.0 Aortic valve stenosis, etiology of cardiac valve  disease                   unspecified.                   Severe aortic valve stenosis     History:         Patient has prior history of Echocardiogram examinations,  most                   recent 02/27/2021.  Signs/Symptoms:Murmur; Risk                   Factors:Hypertension. Moderate aortic stenosis.     Sonographer:     Sherrie Sport  Referring Phys:  [id number] Kate Sable  Diagnosing Phys: Kathlyn Sacramento MD      Sonographer Comments: Suboptimal apical window.  IMPRESSIONS     1. Left ventricular ejection fraction, by estimation, is 60 to 65%. The  left ventricle has normal function. The left ventricle has no regional  wall motion abnormalities. There is mild left ventricular hypertrophy.  Left ventricular diastolic parameters  are indeterminate.   2. Right ventricular systolic function is normal. The right ventricular  size is normal. Tricuspid regurgitation signal is inadequate for assessing  PA pressure.   3. Left atrial size was mildly dilated.   4. The mitral valve is normal in structure. No evidence of mitral  valve  regurgitation. No evidence of mitral stenosis.   5. The aortic valve is bicuspid. Aortic valve regurgitation is not  visualized. Severe aortic valve stenosis. Aortic valve area, by VTI  measures 0.64 cm. Aortic valve mean gradient measures 39.7 mmHg.   6. The inferior vena cava is normal in size with greater than 50%  respiratory variability, suggesting right atrial pressure of 3 mmHg.   FINDINGS   Left Ventricle: Left ventricular ejection fraction, by estimation, is 60  to 65%. The left ventricle has normal function. The left ventricle has no  regional wall motion abnormalities. The left ventricular internal cavity  size was normal in size. There is   mild left ventricular hypertrophy. Left ventricular diastolic parameters  are indeterminate.   Right Ventricle: The right ventricular size is normal. No increase in  right ventricular wall thickness. Right ventricular systolic function is  normal. Tricuspid regurgitation signal is inadequate for assessing PA  pressure.   Left Atrium: Left atrial size was mildly dilated.   Right Atrium: Right atrial size was  normal in size.   Pericardium: There is no evidence of pericardial effusion.   Mitral Valve: The mitral valve is normal in structure. No evidence of  mitral valve regurgitation. No evidence of mitral valve stenosis. MV peak  gradient, 3.7 mmHg. The mean mitral valve gradient is 2.0 mmHg.   Tricuspid Valve: The tricuspid valve is normal in structure. Tricuspid  valve regurgitation is not demonstrated. No evidence of tricuspid  stenosis.   Aortic Valve: The aortic valve is bicuspid. Aortic valve regurgitation is  not visualized. Severe aortic stenosis is present. Aortic valve mean  gradient measures 39.7 mmHg. Aortic valve peak gradient measures 57.4  mmHg. Aortic valve area, by VTI measures  0.64 cm.   Pulmonic Valve: The pulmonic valve was normal in structure. Pulmonic valve  regurgitation is not visualized. No evidence of pulmonic stenosis.   Aorta: The aortic root is normal in size and structure.   Venous: The inferior vena cava is normal in size with greater than 50%  respiratory variability, suggesting right atrial pressure of 3 mmHg.   IAS/Shunts: No atrial level shunt detected by color flow Doppler.      LEFT VENTRICLE  PLAX 2D  LVIDd:         4.80 cm   Diastology  LVIDs:         3.20 cm   LV e' medial:    3.81 cm/s  LV PW:         1.00 cm   LV E/e' medial:  17.5  LV IVS:        1.20 cm   LV e' lateral:   5.33 cm/s  LVOT diam:     2.00 cm   LV E/e' lateral: 12.5  LV SV:         57  LV SV Index:   26  LVOT Area:     3.14 cm      LEFT ATRIUM             Index        RIGHT ATRIUM           Index  LA diam:        3.70 cm 1.70 cm/m   RA Area:     12.40 cm  LA Vol (A2C):   77.9 ml 35.76 ml/m  RA Volume:   25.80 ml  11.84 ml/m  LA Vol (A4C):   32.7 ml 15.01 ml/m  LA Biplane Vol: 50.8 ml 23.32 ml/m   AORTIC VALVE                     PULMONIC VALVE  AV Area (Vmax):    0.69 cm      PV Vmax:        0.73 m/s  AV Area (Vmean):   0.65 cm      PV Vmean:       47.850  cm/s  AV Area (VTI):     0.64 cm      PV VTI:         0.145 m  AV Vmax:           378.67 cm/s   PV Peak grad:   2.2 mmHg  AV Vmean:          298.667 cm/s  PV Mean grad:   1.0 mmHg  AV VTI:            0.887 m       RVOT Peak grad: 4 mmHg  AV Peak Grad:      57.4 mmHg  AV Mean Grad:      39.7 mmHg  LVOT Vmax:         82.90 cm/s  LVOT Vmean:        61.800 cm/s  LVOT VTI:          0.181 m  LVOT/AV VTI ratio: 0.20     AORTA  Ao Root diam: 3.47 cm   MITRAL VALVE               TRICUSPID VALVE  MV Area (PHT): 2.96 cm    TR Peak grad:   10.5 mmHg  MV Area VTI:   2.21 cm    TR Vmax:        162.00 cm/s  MV Peak grad:  3.7 mmHg  MV Mean grad:  2.0 mmHg    SHUNTS  MV Vmax:       0.96 m/s    Systemic VTI:  0.18 m  MV Vmean:      68.7 cm/s   Systemic Diam: 2.00 cm  MV Decel Time: 256 msec    Pulmonic VTI:  0.187 m  MV E velocity: 66.80 cm/s  MV A velocity: 90.40 cm/s  MV E/A ratio:  0.74   Kathlyn Sacramento MD  Electronically signed by Kathlyn Sacramento MD  Signature Date/Time: 03/09/2021/11:26:20 AM         Final     Physicians  Panel Physicians Referring Physician Case Authorizing Physician  End, Harrell Gave, MD (Primary)     Procedures  RIGHT HEART CATH AND CORONARY ANGIOGRAPHY   Conclusion  Conclusions: Minimal coronary plaquing in the LAD.  No angiographically significant coronary artery disease identified. Mildly elevated left heart filling pressures. Moderately elevated right heart filling pressure with mild RVOT gradient. Normal Fick cardiac output/index.   Recommendations: Initiate gentle diuresis for element of HFpEF.  Volume status will need to be watched closely in the setting of severe aortic stenosis. Primary prevention of coronary artery disease. Proceed with referral to cardiac surgery/valve team for further workup/management of severe aortic stenosis by echo.   Nelva Bush, MD [facility]     Recommendations  Antiplatelet/Anticoag No indication for  antiplatelet therapy at this time .  Discharge Date In the absence of any other complications or medical issues, we expect the patient to be ready for discharge from a cath perspective on 03/10/2021.   Indications  Severe aortic stenosis [I35.0 (ICD-10-CM)]   Clinical Presentation  CHF/Shock Congestive heart failure present. NYHA Class III. No shock present.   Procedural Details  Technical Details Indication: 66 y.o. year-old man with history of hypertension and hyperlipidemia, presenting for evaluation of exertional chest pain and shortness of breath in the setting of recently diagnosed severe aortic stenosis.  GFR: >60 ml/min  Procedure: The risks, benefits, complications, treatment options, and expected outcomes were discussed with the patient. The patient and/or family concurred with the proposed plan, giving informed consent. The right wrist and elbow were prepped and draped in a sterile fashion. 1% lidocaine was used for local anesthesia. Ultrasound was used to evaluate the right basilic vein. It was patent.  A micropuncture needle was used to access the right basilic vein under ultrasound guidance. A 50F slender Glidesheath was inserted using modified Seldinger technique. Right heart catheterization was performed by advancing a 50F balloon-tipped catheter through the right heart chambers into the pulmonary capillary wedge position. Pressure measurements and oxygen saturations were obtained.  Ultrasound was used to evaluate the right radial artery. It was patent.  A micropuncture needle was used to access the right radial artery under ultrasound guidance. Using the modified Seldinger access technique, a 19F slender Glidesheath was placed in the right radial artery. 3 mg Verapamil was given through the sheath. Heparin 5,000 units were administered.  Selective coronary angiography was performed using a 50F TIG4.0 catheter to engage the left coronary artery and a 50F AR mod catheter to engage the  right coronary artery.  Attempts to engage the RCA with a 50F TIG4.0, 3DRC, and AL1 were unsuccessful.  left heart catheterization was not attempted, given known severe aortic stenosis.  At the end of the procedure, the radial artery sheath was removed and a TR band applied to achieve patent hemostasis. The basilic vein sheath was removed and hemostasis achieved with manual compression. There were no immediate complications. The patient was taken to the recovery area in stable condition. Estimated blood loss <50 mL.   During this procedure medications were administered to achieve and maintain moderate conscious sedation while the patient's heart rate, blood pressure, and oxygen saturation were continuously monitored and I was present face-to-face 100% of this time.   Medications (Filter: Administrations occurring from [phone] to 1035 on 03/10/21) fentaNYL (SUBLIMAZE) injection (mcg) Total dose:  50 mcg Date/Time Rate/Dose/Volume Action   03/10/21 0934 25 mcg Given   0959 25 mcg Given    midazolam (VERSED) injection (mg) Total dose:  2 mg Date/Time Rate/Dose/Volume Action   03/10/21 0934 1 mg Given   0959 1 mg Given    Heparin (Porcine) in NaCl 1000-0.9 UT/500ML-% SOLN (mL) Total volume:  1,000 mL Date/Time Rate/Dose/Volume Action   03/10/21 0934 1,000 mL Given    verapamil (ISOPTIN) injection (mg) Total dose:  2.5 mg Date/Time Rate/Dose/Volume Action   03/10/21 0956 2.5 mg Given    heparin sodium (porcine) injection (Units) Total dose:  5,000 Units Date/Time Rate/Dose/Volume Action   03/10/21 0959 5,000 Units Given    iohexol (OMNIPAQUE) 300 MG/ML solution (mL) Total volume:  88 mL Date/Time Rate/Dose/Volume Action   03/10/21 1020 88 mL Given    Sedation Time  Sedation Time Physician-1: 45 minutes 44 seconds Contrast  Medication Name Total Dose  iohexol (OMNIPAQUE) 300 MG/ML solution 88 mL   Radiation/Fluoro  Fluoro time: 11.8 (min) DAP: 40.2 (Gycm2) Cumulative Air  Kerma: 633 (mGy) Complications  Complications documented before study signed (03/10/2021 10:54 AM)  No complications were associated with this study.  Documented by Strain, Justin C, RT - 03/10/2021 10:20 AM     Coronary Findings  Diagnostic Dominance: Right Left Anterior Descending  Vessel is moderate in size. The vessel exhibits minimal luminal irregularities. The vessel originates from a separate ostium.    First Diagonal Branch  Vessel is small in size.    Second Diagonal Branch  Vessel is moderate in size.    Third Diagonal Branch  Vessel is small in size.    Left Circumflex  Vessel is moderate in size. Vessel is angiographically normal. The vessel originates from a separate ostium.    First Obtuse Marginal Branch  Vessel is small in size.    Second Obtuse Marginal Branch  Vessel is large in size.    Third Obtuse Marginal Branch  Vessel is small in size.    Left Posterior Atrioventricular Artery  Vessel is small in size.    Right Coronary Artery  Vessel is moderate in size. Vessel is angiographically normal.    Right Posterior Descending Artery  Vessel is small in size.    Right Posterior Atrioventricular Artery  Vessel is small in size.    First Right Posterolateral Branch  Vessel is small in size.    Intervention   No interventions have been documented.   Right Heart  Right Heart Pressures RA (mean): 12 mmHg RV (S/EDP): 44/13 mmHg PA (S/D, mean): 35/22 mmHg PCWP (mean): 23 mmHg  Ao sat: 99% PA sat: 80%  Fick CO: 7.9 L/min Fick CI: 3.6 L/min/m^2   Left Heart  Aortic Valve The aortic valve is calcified.   Coronary Diagrams  Diagnostic Dominance: Right Intervention  Implants     No implant documentation for this case.   Syngo Images   Show images for CARDIAC CATHETERIZATION Images on Long Term Storage   Show images for Axcel, Horsch. "Randall Flores" Link to Procedure Log  Procedure Log    Hemo Data  Flowsheet Row Most  Recent Value  Fick Cardiac Output 7.9 L/min  Fick Cardiac Output Index 3.62 (L/min)/BSA  RA A Wave 14 mmHg  RA V Wave 10 mmHg  RA Mean 9 mmHg  RV Systolic Pressure 39 mmHg  RV Diastolic Pressure 1 mmHg  RV EDP 11 mmHg  PA Systolic Pressure 32 mmHg  PA Diastolic Pressure 16 mmHg  PA Mean 22 mmHg  PW A Wave 21 mmHg  PW V Wave 25 mmHg  PW Mean 19 mmHg  AO Systolic Pressure 284 mmHg  AO Diastolic Pressure 82 mmHg  AO Mean 113 mmHg  QP/QS 1  TPVR Index 6.08 HRUI  TSVR Index 28.14 HRUI  TPVR/TSVR Ratio 0.22    Impression:  This 66 year old gentleman has stage D, severe, symptomatic bicuspid aortic valve stenosis with New York Heart Association class II-III symptoms of exertional fatigue and shortness of breath as well as intermittent dizziness and chest discomfort.  His echocardiogram shows a mean gradient of 40 mmHg and a valve area of 0.64 cm consistent with severe aortic stenosis.  Cardiac catheterization shows no angiographically significant coronary disease.  I agree that aortic valve replacement is indicated in this patient for relief of his progressive symptoms and to prevent left ventricular dysfunction.  Given his relatively young age of 22 I think that open surgical aortic valve replacement using a bovine pericardial bioprosthesis would be the best treatment for him.  I discussed the alternative of transcatheter aortic valve replacement which I would not recommend given his  young age.  The long-term durability is undetermined and I think open surgical valve replacement would give him the best long-term result with the option of valve in valve TAVR in the future if he should have structural valve deterioration.  I discussed the operative procedure with the patient and family including alternatives, benefits and risks; including but not limited to bleeding, blood transfusion, infection, stroke, myocardial infarction, graft failure, heart block requiring a permanent pacemaker, organ  dysfunction, and death.  Randall Flores. understands and agrees to proceed.  He will need a CT scan of the chest to evaluate for aortic aneurysm given his bicuspid aortic valve.  We will do this without contrast since he has an iodine allergy and felt poorly after his prior CT scans.  Plan:  He will be scheduled for a CT scan of the chest without contrast to evaluate his aorta and will then be scheduled for aortic valve replacement using a bioprosthetic valve on Thursday, 04/16/2021.  I spent 60 minutes performing this consultation and > 50% of this time was spent face to face counseling and coordinating the care of this patient's severe symptomatic bicuspid aortic valve stenosis.  Gaye Pollack, MD [facility] [phone]

## 2021-03-20 ENCOUNTER — Encounter: Payer: Medicare HMO | Admitting: Surgery

## 2021-03-23 ENCOUNTER — Ambulatory Visit: Payer: Medicare HMO | Admitting: Cardiology

## 2021-03-26 ENCOUNTER — Other Ambulatory Visit: Payer: Self-pay

## 2021-03-26 ENCOUNTER — Other Ambulatory Visit: Payer: Medicare HMO

## 2021-03-26 DIAGNOSIS — C61 Malignant neoplasm of prostate: Secondary | ICD-10-CM

## 2021-03-27 LAB — PSA: Prostate Specific Ag, Serum: 8.4 ng/mL — ABNORMAL HIGH (ref 0.0–4.0)

## 2021-03-31 NOTE — Progress Notes (Signed)
? ?04/01/2021 ? ?Randall Flores. ?1955/06/19 ?[id number] ? ?Referring provider:  ?Frazier Richards, MD ?[address] ?[place],  [place] [address] ?Chief Complaint  ?Patient presents with  ? Prostate Cancer  ? ? ?HPI: ?Randall Flores. is a 66 y.o.male with a personal history of prostate cancer, BPH with urinary obstruction, and ED, who presents today for a 6 month follow-up with PSA prior.  ? ?He was diagnosed 09/2018 with prostate cancer. Pathology revealed 3 cores of Gleason 3+3 ranging up to 50% (left lateral base) of the tissue.  He has had a few cores of high-grade PIN and suspicious but nondiagnostic biopsy cores. ?  ?His TRUS volume on 06/28/2019 67.3 g.  ?  ?His most recent PSA on 03/26/2021 was 8.4 with a previous PSA  of 7.1 on 09/26/2020. ? ?He is managed on Flomax.  ? ?He reports today that he is undergoing valve surgery on the 23rd of this month. ? ?He reports that his wife's health has been rabidly declining. He has been under a lot of stress due to this.  She now has brain mets.  She is refusing to let anybody come in and assist. ? ?He reports that he is concerned with taking lasix and tamsulosin. He has been having increased urinary urgency and frequency. ? ? ?PSA trend:  ?Component Prostate Specific Ag, Serum  ?Latest Ref Rng & Units 0.0 - 4.0 ng/mL  ?10/06/2017 4.6 (H)  ?01/09/2018 6.7 (H)  ?03/06/2018 5.3 (H)  ?07/10/2018 6.6 (H)  ?06/27/2019 5.4 (H)  ?09/26/2020 7.1 (H)  ?03/26/2021 8.4 (H)  ? ? ? ? ? ?PMH: ?Past Medical History:  ?Diagnosis Date  ? Anxiety   ? BPH with obstruction/lower urinary tract symptoms   ? Colon polyp   ? Multiple  ? Complication of anesthesia   ? wakes up during anesthesia  ? DDD (degenerative disc disease), lumbar   ? Diverticulitis   ? Diverticulosis   ? Elevated PSA   ? Fatty liver 01/22/2015  ? Mild, noted on [place] Abd  ? Hepatic cyst 06/08/2016  ? Left, noted on CT renal  ? Hepatic hemangioma 02/03/2015  ? Small, noted on MRI Abd  ? History of colitis   ? History of kidney stones    ? Hypercholesteremia   ? Hypertension   ? Hypothyroidism   ? Major depression   ? Moderate aortic stenosis 05/24/2017  ? Noted on ECHO  ? Near syncope 04/25/2017  ? due to stress  ? OA (osteoarthritis)   ? knees, back, hands  ? Pre-diabetes   ? PVC (premature ventricular contraction)   ? Stroke [facility]) 2015  ? TIA/right side affected, right side has improved  ? Wears dentures   ? partial upper, front  ? ? ?Surgical History: ?Past Surgical History:  ?Procedure Laterality Date  ? [place], 2001  ? [place] L5-S1  ? COLONOSCOPY    ? [place]  ? COLONOSCOPY WITH PROPOFOL N/A 01/21/2017  ? Procedure: COLONOSCOPY WITH PROPOFOL;  Surgeon: [name], MD;  Location: [place] [facility];  Service: Endoscopy;  Laterality: N/A;  ? POLYPECTOMY  01/21/2017  ? Procedure: POLYPECTOMY;  Surgeon: [name], MD;  Location: [place];  Service: Endoscopy;;  ? RIGHT HEART CATH AND CORONARY ANGIOGRAPHY N/A 03/10/2021  ? Procedure: RIGHT HEART CATH AND CORONARY ANGIOGRAPHY;  Surgeon: Nelva Bush, MD;  Location: [place] [facility];  Service: Cardiovascular;  Laterality: N/A;  ? TOTAL KNEE ARTHROPLASTY Right 04/07/2018  ?  Procedure: TOTAL KNEE ARTHROPLASTY;  Surgeon: Earlie Server, MD;  [facility];  Service: Orthopedics;  Laterality: Right;  ? ? ?Home Medications:  ?Allergies as of 04/01/2021   ? ?   Reactions  ? Codeine Nausea And Vomiting  ? Ivp Dye [iodinated Contrast Media] Nausea And Vomiting  ? Also felt very hot/flushed  ? Pravastatin   ? Other reaction(s): Muscle Pain  ? Shellfish Allergy Nausea And Vomiting  ? ?  ? ?  ?Medication List  ?  ? ?  ? Accurate as of April 01, 2021 11:59 PM. If you have any questions, ask your nurse or doctor.  ?  ?  ? ?  ? ?acetaminophen 500 MG tablet ?Commonly known as: TYLENOL ?Take 1,000 mg by mouth every 8 (eight) hours as needed (pain). ?  ?cholecalciferol 25 MCG (1000 UNIT) tablet ?Commonly known as: VITAMIN D3 ?Take 1,000 Units by mouth daily. ?  ?citalopram 20  MG tablet ?Commonly known as: CELEXA ?Take 20 mg by mouth daily. ?  ?DULoxetine 60 MG capsule ?Commonly known as: CYMBALTA ?Take 60 mg by mouth daily. ?  ?furosemide 20 MG tablet ?Commonly known as: Lasix ?Take 1 tablet (20 mg total) by mouth daily. ?  ?hydrOXYzine 25 MG tablet ?Commonly known as: ATARAX ?Take 1 tablet (25 mg total) by mouth 3 (three) times daily as needed for anxiety. ?  ?levothyroxine 150 MCG tablet ?Commonly known as: SYNTHROID ?Take 150 mcg by mouth daily before breakfast. ?  ?lisinopril 10 MG tablet ?Commonly known as: ZESTRIL ?Take 10 mg by mouth daily. ?  ?multivitamin capsule ?Take 1 capsule by mouth daily. ?  ?rosuvastatin 10 MG tablet ?Commonly known as: CRESTOR ?Take 10 mg by mouth daily. ?  ?tamsulosin 0.4 MG Caps capsule ?Commonly known as: FLOMAX ?Take 1 capsule (0.4 mg total) by mouth daily. ?  ? ?  ? ? ?Allergies:  ?Allergies  ?Allergen Reactions  ? Codeine Nausea And Vomiting  ? Ivp Dye [Iodinated Contrast Media] Nausea And Vomiting  ?  Also felt very hot/flushed  ? Pravastatin   ?  Other reaction(s): Muscle Pain  ? Shellfish Allergy Nausea And Vomiting  ? ? ?Family History: ?Family History  ?Problem Relation Age of Onset  ? Alzheimer's disease Mother   ? ? ?Social History:  reports that he has never smoked. He has never used smokeless tobacco. He reports current alcohol use of about 14.0 standard drinks per week. He reports current drug use. Drug: Marijuana. ? ? ?Physical Exam: ?BP 126/79   Pulse 80   Ht '5\' 11"'$  (1.803 m)   Wt 216 lb (98 kg)   BMI 30.13 kg/m?   ?Constitutional:  Alert and oriented, No acute distress. ?HEENT: [place] AT, moist mucus membranes.  Trachea midline, no masses. ?Cardiovascular: No clubbing, cyanosis, or edema. ?Respiratory: Normal respiratory effort, no increased work of breathing. ?Skin: No rashes, bruises or suspicious lesions. ?Neurologic: Grossly intact, no focal deficits, moving all 4 extremities. ?Psychiatric: Normal mood and affect. ? ?Laboratory  Data: ?Lab Results  ?Component Value Date  ? CREATININE 0.96 03/06/2021  ? ? ?Assessment & Plan:   ?History of Prostate cancer  ?- PSA has risen and continues to trend upwards, or 6 months ago further work-up was deferred in light of his multiple medical issues as well as social issues however unfortunately these do not seem to be improving and PSA continues to rise ?-Discussed today ?today that at this point in time, would strongly recommend further work-up for rising PSA ?-  Will have him undergo a prostate MRI to further evaluate; We will call him with results. Depending on results may have him undergo a fusion biopsy versus PSA recheck ?- MRI prostate; scheduled  ? ?2. Urinary frequency  ?- likely exasperated by lasix recommend he continue Flomax  ? ?Will call him with MRI results ? ?Conley Rolls as a scribe for Hollice Espy, MD.,have documented all relevant documentation on the behalf of Hollice Espy, MD,as directed by  Hollice Espy, MD while in the presence of Hollice Espy, MD. ? ?I have reviewed the above documentation for accuracy and completeness, and I agree with the above.  ? ?Hollice Espy, MD ? ?I spent 31 total minutes on the day of the encounter including pre-visit review of the medical record, face-to-face time with the patient, and post visit ordering of labs/imaging/tests. ? ?[facility] ?[address], [address] ?[place], [place] [address] ?[phone]

## 2021-04-01 ENCOUNTER — Other Ambulatory Visit: Payer: Self-pay

## 2021-04-01 ENCOUNTER — Ambulatory Visit: Payer: Medicare HMO | Admitting: Urology

## 2021-04-01 VITALS — BP 126/79 | HR 80 | Ht 71.0 in | Wt 216.0 lb

## 2021-04-01 DIAGNOSIS — C61 Malignant neoplasm of prostate: Secondary | ICD-10-CM

## 2021-04-01 NOTE — Patient Instructions (Signed)
Prostate MRI Prep: ? ?1- No ejaculation 48 hours prior to exam ? ?2- No food or drink or caffeine 4 hours prior to exam ? ?3- Fleets enema needs to be done 4 hours prior to exam  ? ?4- Urinate just prior to exam  ?

## 2021-04-02 ENCOUNTER — Ambulatory Visit
Admission: RE | Admit: 2021-04-02 | Discharge: 2021-04-02 | Disposition: A | Discharge status: Home / Self Care | Payer: Medicare HMO | Source: Ambulatory Visit | Attending: Surgery | Admitting: Surgery

## 2021-04-02 DIAGNOSIS — I700 Atherosclerosis of aorta: Secondary | ICD-10-CM | POA: Insufficient documentation

## 2021-04-02 DIAGNOSIS — I350 Nonrheumatic aortic (valve) stenosis: Secondary | ICD-10-CM | POA: Insufficient documentation

## 2021-04-02 DIAGNOSIS — E278 Other specified disorders of adrenal gland: Secondary | ICD-10-CM | POA: Diagnosis not present

## 2021-04-09 ENCOUNTER — Ambulatory Visit: Payer: Medicare HMO | Admitting: Adult Health

## 2021-04-13 NOTE — Progress Notes (Signed)
Surgical Instructions ? ? ? Your procedure is scheduled on Thursday March 23rd. ? Report to [facility] Main Entrance "A" at 5:30 A.M., then check in with the Admitting office. ? Call this number if you have problems the morning of surgery: ? [phone] ? ? If you have any questions prior to your surgery date call [phone]: Open Monday-Friday 8am-4pm ? ? ? Remember: ? Do not eat or drink after midnight the night before your surgery ? ?  ? Take these medicines the morning of surgery with A SIP OF WATER: ?Per your surgeon, take usual cardiac medications (calcium-channel blockers, beta blockers, digoxin, or nitrates) the morning of surgery with a sip of water  ?Per your surgeon, stop taking clopidogrel (Plavix) and other NSAIDs at least 7 days prior to surgery ?citalopram (CELEXA) 20 MG tablet ?DULoxetine (CYMBALTA) 60 MG capsule ?levothyroxine (SYNTHROID, LEVOTHROID) 150 MCG tablet ?rosuvastatin (CRESTOR) 10 MG tablet ?tamsulosin (FLOMAX) 0.4 MG CAPS capsule ? ?IF NEEDED ?acetaminophen (TYLENOL) 500 MG tablet ?hydrOXYzine (ATARAX/VISTARIL) 25 MG tablet ? ? ? Follow your surgeon's instructions regarding Aspirin.  If no instructions were given by your surgeon then you will need to call the office to get those instructions.   ? ?As of today, STOP taking any Aspirin (unless otherwise instructed by your surgeon) Aleve, Naproxen, Ibuprofen, Motrin, Advil, Goody's, BC's, all herbal medications, fish oil, and all vitamins. ? ?         ?Do not wear jewelry  ?Do not wear lotions, powders, colognes, or deodorant. ?Do not shave 48 hours prior to surgery.  Men may shave face and neck. ?Do not bring valuables to the hospital. ?Do not wear nail polish, gel polish, artificial nails, or any other type of covering on natural nails (fingers and toes) ?If you have artificial nails or gel coating that need to be removed by a nail salon, please have this removed prior to surgery. Artificial nails or gel coating may interfere with  anesthesia's ability to adequately monitor your vital signs. ? ?[facility] is not responsible for any belongings or valuables. .  ? ?Do NOT Smoke (Tobacco/Vaping)  24 hours prior to your procedure ? ?If you use a CPAP at night, you may bring your mask for your overnight stay. ?  ?Contacts, glasses, hearing aids, dentures or partials may not be worn into surgery, please bring cases for these belongings ?  ?For patients admitted to the hospital, discharge time will be determined by your treatment team. ?  ?Patients discharged the day of surgery will not be allowed to drive home, and someone needs to stay with them for 24 hours. ? ?NO VISITORS WILL BE ALLOWED IN PRE-OP WHERE PATIENTS ARE PREPPED FOR SURGERY.  ONLY 1 SUPPORT PERSON MAY BE PRESENT IN THE WAITING ROOM WHILE YOU ARE IN SURGERY.  IF YOU ARE TO BE ADMITTED, ONCE YOU ARE IN YOUR ROOM YOU WILL BE ALLOWED TWO (2) VISITORS. 1 (ONE) VISITOR MAY STAY OVERNIGHT BUT MUST ARRIVE TO THE ROOM BY 8pm.  Minor children may have two parents present. Special consideration for safety and communication needs will be reviewed on a case by case basis. ? ?Special instructions:   ? ?Oral Hygiene is also important to reduce your risk of infection.  Remember - BRUSH YOUR TEETH THE MORNING OF SURGERY WITH YOUR REGULAR TOOTHPASTE ? ? ?- Preparing For Surgery ? ?Before surgery, you can play an important role. Because skin is not sterile, your skin needs to be as free of germs as  possible. You can reduce the number of germs on your skin by washing with CHG (chlorahexidine gluconate) Soap before surgery.  CHG is an antiseptic cleaner which kills germs and bonds with the skin to continue killing germs even after washing.   ? ? ?Please do not use if you have an allergy to CHG or antibacterial soaps. If your skin becomes reddened/irritated stop using the CHG.  ?Do not shave (including legs and underarms) for at least 48 hours prior to first CHG shower. It is OK to shave your  face. ? ?Please follow these instructions carefully. ?  ? ? Shower the NIGHT BEFORE SURGERY and the MORNING OF SURGERY with CHG Soap.  ? If you chose to wash your hair, wash your hair first as usual with your normal shampoo. After you shampoo, rinse your hair and body thoroughly to remove the shampoo.  Then [employer] and genitals (private parts) with your normal soap and rinse thoroughly to remove soap. ? ?After that Use CHG Soap as you would any other liquid soap. You can apply CHG directly to the skin and wash gently with a scrungie or a clean washcloth.  ? ?Apply the CHG Soap to your body ONLY FROM THE NECK DOWN.  Do not use on open wounds or open sores. Avoid contact with your eyes, ears, mouth and genitals (private parts). Wash Face and genitals (private parts)  with your normal soap.  ? ?Wash thoroughly, paying special attention to the area where your surgery will be performed. ? ?Thoroughly rinse your body with warm water from the neck down. ? ?DO NOT shower/wash with your normal soap after using and rinsing off the CHG Soap. ? ?Pat yourself dry with a CLEAN TOWEL. ? ?Wear CLEAN PAJAMAS to bed the night before surgery ? ?Place CLEAN SHEETS on your bed the night before your surgery ? ?DO NOT SLEEP WITH PETS. ? ? ?Day of Surgery: ? ?Take a shower with CHG soap. ?Wear Clean/Comfortable clothing the morning of surgery ?Do not apply any deodorants/lotions.   ?Remember to brush your teeth WITH YOUR REGULAR TOOTHPASTE. ? ? ? ?COVID testing ? ?If you are going to stay overnight or be admitted after your procedure/surgery and require a pre-op COVID test, please follow these instructions after your COVID test  ? ?You are not required to quarantine however you are required to wear a well-fitting mask when you are out and around people not in your household.  If your mask becomes wet or soiled, replace with a new one. ? ?Wash your hands often with soap and water for 20 seconds or clean your hands with an alcohol-based  hand sanitizer that contains at least 60% alcohol. ? ?Do not share personal items. ? ?Notify your provider: ?if you are in close contact with someone who has COVID  ?or if you develop a fever of 100.4 or greater, sneezing, cough, sore throat, shortness of breath or body aches. ? ?  ?Please read over the following fact sheets that you were given.  ? ?

## 2021-04-14 ENCOUNTER — Other Ambulatory Visit: Payer: Self-pay

## 2021-04-14 ENCOUNTER — Encounter: Payer: Medicare HMO | Admitting: Surgery

## 2021-04-14 ENCOUNTER — Ambulatory Visit (HOSPITAL_BASED_OUTPATIENT_CLINIC_OR_DEPARTMENT_OTHER)
Admission: RE | Admit: 2021-04-14 | Discharge: 2021-04-14 | Disposition: A | Discharge status: Home / Self Care | Payer: Medicare HMO | Source: Ambulatory Visit | Attending: Surgery | Admitting: Surgery

## 2021-04-14 ENCOUNTER — Encounter (HOSPITAL_COMMUNITY)
Admission: RE | Admit: 2021-04-14 | Discharge: 2021-04-14 | Disposition: A | Discharge status: Home / Self Care | Payer: Medicare HMO | Source: Ambulatory Visit | Attending: Surgery | Admitting: Surgery

## 2021-04-14 ENCOUNTER — Encounter (HOSPITAL_COMMUNITY): Payer: Self-pay

## 2021-04-14 VITALS — BP 114/69 | HR 77 | Temp 98.3°F | Resp 17 | Ht 71.0 in | Wt 217.2 lb

## 2021-04-14 DIAGNOSIS — I350 Nonrheumatic aortic (valve) stenosis: Secondary | ICD-10-CM

## 2021-04-14 DIAGNOSIS — Z20822 Contact with and (suspected) exposure to covid-19: Secondary | ICD-10-CM | POA: Insufficient documentation

## 2021-04-14 DIAGNOSIS — Z01818 Encounter for other preprocedural examination: Secondary | ICD-10-CM | POA: Insufficient documentation

## 2021-04-14 LAB — COMPREHENSIVE METABOLIC PANEL
ALT: 19 U/L (ref 0–44)
AST: 18 U/L (ref 15–41)
Albumin: 4.2 g/dL (ref 3.5–5.0)
Alkaline Phosphatase: 66 U/L (ref 38–126)
Anion gap: 9 (ref 5–15)
BUN: 14 mg/dL (ref 8–23)
CO2: 25 mmol/L (ref 22–32)
Calcium: 9.4 mg/dL (ref 8.9–10.3)
Chloride: 102 mmol/L (ref 98–111)
Creatinine, Ser: 1.1 mg/dL (ref 0.61–1.24)
GFR, Estimated: >60 mL/min (ref >60)
Glucose, Bld: 86 mg/dL (ref 70–99)
Potassium: 4.1 mmol/L (ref 3.5–5.1)
Sodium: 136 mmol/L (ref 135–145)
Total Bilirubin: 0.7 mg/dL (ref 0.3–1.2)
Total Protein: 6.8 g/dL (ref 6.5–8.1)

## 2021-04-14 LAB — CBC
HCT: 42.1 % (ref 39.0–52.0)
Hemoglobin: 13.9 g/dL (ref 13.0–17.0)
MCH: 29.6 pg (ref 26.0–34.0)
MCHC: 33 g/dL (ref 30.0–36.0)
MCV: 89.8 fL (ref 80.0–100.0)
Platelets: 299 10*3/uL (ref 150–400)
RBC: 4.69 MIL/uL (ref 4.22–5.81)
RDW: 12.3 % (ref 11.5–15.5)
WBC: 6.7 10*3/uL (ref 4.0–10.5)
nRBC: 0 % (ref 0.0–0.2)

## 2021-04-14 LAB — SARS CORONAVIRUS 2 (TAT 6-24 HRS): SARS Coronavirus 2: NEGATIVE

## 2021-04-14 LAB — TYPE AND SCREEN
ABO/RH(D): A POS
Antibody Screen: NEGATIVE

## 2021-04-14 LAB — URINALYSIS, ROUTINE W REFLEX MICROSCOPIC
Bilirubin Urine: NEGATIVE
Glucose, UA: NEGATIVE mg/dL
Hgb urine dipstick: NEGATIVE
Ketones, ur: NEGATIVE mg/dL
Leukocytes,Ua: NEGATIVE
Nitrite: NEGATIVE
Protein, ur: NEGATIVE mg/dL
Specific Gravity, Urine: 1.009 (ref 1.005–1.030)
pH: 6 (ref 5.0–8.0)

## 2021-04-14 LAB — HEMOGLOBIN A1C
Hgb A1c MFr Bld: 5.9 % — ABNORMAL HIGH (ref 4.8–5.6)
Mean Plasma Glucose: 122.63 mg/dL

## 2021-04-14 LAB — SURGICAL PCR SCREEN
MRSA, PCR: NEGATIVE
Staphylococcus aureus: NEGATIVE

## 2021-04-14 LAB — PROTIME-INR
INR: 0.9 (ref 0.8–1.2)
Prothrombin Time: 12.5 seconds (ref 11.4–15.2)

## 2021-04-14 LAB — APTT: aPTT: 24 seconds (ref 24–36)

## 2021-04-14 NOTE — Progress Notes (Signed)
PCP:  Beverlyn Roux, MD ?Cardiologist:  Kate Sable ? ?EKG:04/14/21 ?CXR: 04/14/21 ?ECHO: 03/19/21 ?Stress Test: 12/11/21 ?Cardiac Cath: 03/10/21 ? ?Fasting Blood Sugar-na ?Checks Blood Sugar__na_ times a day ? ?ASA: Continue per patient ?Blood Thinner: No ? ?OSA/CPAP: No ? ?Covid test 04/14/21 at PAT ? ?Anesthesia Review: Yes, cardiac history ? ?Patient denies shortness of breath, fever, cough, and chest pain at PAT appointment. ? ?Patient verbalized understanding of instructions provided today at the PAT appointment.  Patient asked to review instructions at home and day of surgery.   ?

## 2021-04-15 ENCOUNTER — Encounter (HOSPITAL_COMMUNITY): Payer: Self-pay | Admitting: Surgery

## 2021-04-15 MED ORDER — EPINEPHRINE HCL 5 MG/250ML IV SOLN IN NS
0.0000 ug/min | INTRAVENOUS | Status: DC
  Filled 2021-04-15: qty 250

## 2021-04-15 MED ORDER — MAGNESIUM SULFATE 50 % IJ SOLN
INTRAMUSCULAR | Status: DC
  Filled 2021-04-15: qty 13

## 2021-04-15 MED ORDER — TRANEXAMIC ACID (OHS) PUMP PRIME SOLUTION
2.0000 mg/kg | INTRAVENOUS | Status: DC
Start: 2021-04-16 — End: 2021-04-16
  Filled 2021-04-15: qty 1.97

## 2021-04-15 MED ORDER — TRANEXAMIC ACID 1000 MG/10ML IV SOLN
1.5000 mg/kg/h | INTRAVENOUS | Status: AC
  Administered 2021-04-16: 1.5 mg/kg/h via INTRAVENOUS
  Filled 2021-04-15: qty 25

## 2021-04-15 MED ORDER — DEXMEDETOMIDINE HCL IN NACL 400 MCG/100ML IV SOLN
0.1000 ug/kg/h | INTRAVENOUS | Status: AC
  Administered 2021-04-16: .5 ug/kg/h via INTRAVENOUS
  Filled 2021-04-15: qty 100

## 2021-04-15 MED ORDER — NOREPINEPHRINE 4 MG/250ML-% IV SOLN
0.0000 ug/min | INTRAVENOUS | Status: AC
  Administered 2021-04-16: 5 ug/min via INTRAVENOUS
  Filled 2021-04-15: qty 250

## 2021-04-15 MED ORDER — POTASSIUM CHLORIDE 2 MEQ/ML IV SOLN
80.0000 meq | INTRAVENOUS | Status: DC
  Filled 2021-04-15: qty 40

## 2021-04-15 MED ORDER — MILRINONE LACTATE IN DEXTROSE 20-5 MG/100ML-% IV SOLN
0.3000 ug/kg/min | INTRAVENOUS | Status: DC
  Filled 2021-04-15: qty 100

## 2021-04-15 MED ORDER — CEFAZOLIN SODIUM-DEXTROSE 2-4 GM/100ML-% IV SOLN
2.0000 g | INTRAVENOUS | Status: AC
  Administered 2021-04-16 (×2): 2 g via INTRAVENOUS
  Filled 2021-04-15: qty 100

## 2021-04-15 MED ORDER — NITROGLYCERIN IN D5W 200-5 MCG/ML-% IV SOLN
2.0000 ug/min | INTRAVENOUS | Status: AC
  Administered 2021-04-16: 16.6 ug/min via INTRAVENOUS
  Filled 2021-04-15: qty 250

## 2021-04-15 MED ORDER — PHENYLEPHRINE HCL-NACL 20-0.9 MG/250ML-% IV SOLN
30.0000 ug/min | INTRAVENOUS | Status: AC
  Administered 2021-04-16: 10 ug/min via INTRAVENOUS
  Filled 2021-04-15: qty 250

## 2021-04-15 MED ORDER — HEPARIN SODIUM (PORCINE) 1000 UNIT/ML IJ SOLN
INTRAMUSCULAR | Status: DC
Start: 2021-04-16 — End: 2021-04-16
  Filled 2021-04-15: qty 2.5

## 2021-04-15 MED ORDER — INSULIN REGULAR(HUMAN) IN NACL 100-0.9 UT/100ML-% IV SOLN
INTRAVENOUS | Status: AC
  Administered 2021-04-16: 3.2 [IU]/h via INTRAVENOUS
  Filled 2021-04-15: qty 100

## 2021-04-15 MED ORDER — VANCOMYCIN HCL 1500 MG/300ML IV SOLN
1500.0000 mg | INTRAVENOUS | Status: AC
  Administered 2021-04-16: 1500 mg via INTRAVENOUS
  Filled 2021-04-15: qty 300

## 2021-04-15 MED ORDER — TRANEXAMIC ACID (OHS) BOLUS VIA INFUSION
15.0000 mg/kg | INTRAVENOUS | Status: AC
  Administered 2021-04-16: 1477.5 mg via INTRAVENOUS
  Filled 2021-04-15: qty 1478

## 2021-04-15 MED ORDER — HEPARIN 30,000 UNITS/1000 ML (OHS) CELLSAVER SOLUTION
Status: DC
  Filled 2021-04-15: qty 1000

## 2021-04-15 MED ORDER — CEFAZOLIN SODIUM-DEXTROSE 2-4 GM/100ML-% IV SOLN
2.0000 g | INTRAVENOUS | Status: DC
Start: 2021-04-16 — End: 2021-04-16
  Filled 2021-04-15: qty 100

## 2021-04-15 NOTE — H&P (Signed)
? ?   ?[address] ?      [place] [address] ?            [phone]   ? ?  ?Cardiothoracic Surgery Admission History and Physical ? ? ?PCP is Flinchum, Kelby Aline, FNP ?Referring Provider is Kate Sable, MD ?  ?    ?Chief Complaint  ?Patient presents with  ? Aortic Stenosis  ?     ?  ?  ?HPI: ?  ?The patient is a 66 year old gentleman with a history of hypertension, hyperlipidemia, hypothyroidism, prediabetes, anxiety and depression, and bicuspid aortic valve stenosis.  An echocardiogram on 03/09/2021 showed a bicuspid aortic valve with a mean gradient of 40 mmHg and a valve area of 0.64 cm?.  Left ventricular ejection fraction was 60% with a stroke-volume index of 26.  Cardiac catheterization showed no significant coronary disease with mildly elevated left heart filling pressures.  He reports progressive exertional fatigue and shortness of breath as well as some dizziness.  He has had no syncope.  He has had some exertional left-sided chest discomfort radiating into his left arm and up into his neck on both sides.  He denies orthopnea.  He has had mild ankle edema at times. ?  ?The patient lives at home with his wife and is retired.  His wife has metastatic breast cancer with diffuse metastases including brain and is undergoing intensified chemotherapy at [facility].  He provides most of her care at home. ?    ?Past Medical History:  ?Diagnosis Date  ? Anxiety    ? BPH with obstruction/lower urinary tract symptoms    ? Colon polyp    ?  Multiple  ? Complication of anesthesia    ?  wakes up during anesthesia  ? DDD (degenerative disc disease), lumbar    ? Diverticulitis    ? Diverticulosis    ? Elevated PSA    ? Fatty liver 01/22/2015  ?  Mild, noted on [place] Abd  ? Hepatic cyst 06/08/2016  ?  Left, noted on CT renal  ? Hepatic hemangioma 02/03/2015  ?  Small, noted on MRI Abd  ? History of colitis    ? History of kidney stones    ? Hypercholesteremia    ? Hypertension    ? Hypothyroidism    ?  Major depression    ? Moderate aortic stenosis 05/24/2017  ?  Noted on ECHO  ? Near syncope 04/25/2017  ?  due to stress  ? OA (osteoarthritis)    ?  knees, back, hands  ? Pre-diabetes    ? PVC (premature ventricular contraction)    ? Stroke [facility]) 2015  ?  TIA/right side affected, right side has improved  ? Wears dentures    ?  partial upper, front  ?  ?  ?     ?Past Surgical History:  ?Procedure Laterality Date  ? [place], 2001  ?  [place] L5-S1  ? COLONOSCOPY      ?  [place]  ? COLONOSCOPY WITH PROPOFOL N/A 01/21/2017  ?  Procedure: COLONOSCOPY WITH PROPOFOL;  Surgeon: [name], MD;  Location: [facility];  Service: Endoscopy;  Laterality: N/A;  ? POLYPECTOMY   01/21/2017  ?  Procedure: POLYPECTOMY;  Surgeon: [name], MD;  Location: [place];  Service: Endoscopy;;  ? RIGHT HEART CATH AND CORONARY ANGIOGRAPHY N/A 03/10/2021  ?  Procedure: RIGHT HEART CATH AND CORONARY ANGIOGRAPHY;  Surgeon: Nelva Bush, MD;  Location: [place] [facility];  Service: Cardiovascular;  Laterality: N/A;  ? TOTAL KNEE ARTHROPLASTY Right 04/07/2018  ?  Procedure: TOTAL KNEE ARTHROPLASTY;  Surgeon: Earlie Server, MD;  [facility];  Service: Orthopedics;  Laterality: Right;  ?  ?  ?     ?Family History  ?Problem Relation Age of Onset  ? Alzheimer's disease Mother    ?  ?  ?Social History ?Social History  ?  ?     ?Tobacco Use  ? Smoking status: Never  ? Smokeless tobacco: Never  ?Vaping Use  ? Vaping Use: Never used  ?Substance Use Topics  ? Alcohol use: Yes  ?    Alcohol/week: 14.0 standard drinks  ?    Types: 14 Cans of beer per week  ?    Comment:    ? Drug use: Yes  ?    Types: Marijuana  ?    Comment: had beg. of Feb. 2023  ?  ?  ?      ?Current Outpatient Medications  ?Medication Sig Dispense Refill  ? acetaminophen (TYLENOL) 500 MG tablet Take 1,000 mg by mouth every 8 (eight) hours as needed (pain).      ? cholecalciferol (VITAMIN D3) 25 MCG (1000 UNIT) tablet Take 1,000 Units by  mouth daily.      ? citalopram (CELEXA) 20 MG tablet Take 20 mg by mouth daily.      ? DULoxetine (CYMBALTA) 60 MG capsule Take 60 mg by mouth daily.      ? furosemide (LASIX) 20 MG tablet Take 1 tablet (20 mg total) by mouth daily. 30 tablet 5  ? hydrOXYzine (ATARAX/VISTARIL) 25 MG tablet Take 1 tablet (25 mg total) by mouth 3 (three) times daily as needed for anxiety. 30 tablet 0  ? levothyroxine (SYNTHROID, LEVOTHROID) 150 MCG tablet Take 150 mcg by mouth daily before breakfast.       ? lisinopril (PRINIVIL,ZESTRIL) 10 MG tablet Take 10 mg by mouth daily.       ? Multiple Vitamin (MULTIVITAMIN) capsule Take 1 capsule by mouth daily.      ? rosuvastatin (CRESTOR) 10 MG tablet Take 10 mg by mouth daily.      ? tamsulosin (FLOMAX) 0.4 MG CAPS capsule Take 1 capsule (0.4 mg total) by mouth daily. 90 capsule 3  ?  ?No current facility-administered medications for this visit.  ?  ?  ?     ?Allergies  ?Allergen Reactions  ? Codeine Nausea And Vomiting  ? Ivp Dye [Iodinated Contrast Media] Nausea And Vomiting  ?    Also felt very hot/flushed  ? Pravastatin    ?    Other reaction(s): Muscle Pain  ? Shellfish Allergy Nausea And Vomiting  ?  ?  ?Review of Systems  ?Constitutional:  Positive for activity change and fatigue. Negative for chills, fever and unexpected weight change.  ?HENT:    ?     Saw his dentist 6 months ago and needs to have some fillings.  Has partial dentures  ?Eyes: Negative.   ?Respiratory:  Positive for shortness of breath.   ?Cardiovascular:  Positive for chest pain and leg swelling.  ?Gastrointestinal: Negative.   ?Endocrine: Negative.   ?Genitourinary: Negative.   ?     BPH  ?Musculoskeletal:  Positive for arthralgias and myalgias.  ?Skin: Negative.   ?Allergic/Immunologic: Negative.   ?Neurological:  Positive for dizziness. Negative for syncope.  ?Hematological: Negative.   ?Psychiatric/Behavioral:  The patient is nervous/anxious.   ?  Depression  ?  ?BP 108/68 (BP Location: Left Arm, Patient  Position: Sitting, Cuff Size: Normal)   Pulse 80   Resp 20   Ht '5\' 11"'$  (1.803 m)   Wt 215 lb (97.5 kg)   SpO2 96% Comment: RA  BMI 29.99 kg/m?  ?Physical Exam ?Constitutional:   ?   Appearance: Normal appearance. He is normal weight.  ?HENT:  ?   Head: Normocephalic and atraumatic.  ?Eyes:  ?   Extraocular Movements: Extraocular movements intact.  ?   Conjunctiva/sclera: Conjunctivae normal.  ?   Pupils: Pupils are equal, round, and reactive to light.  ?Neck:  ?   Vascular: No carotid bruit.  ?Cardiovascular:  ?   Rate and Rhythm: Normal rate and regular rhythm.  ?   Pulses: Normal pulses.  ?   Heart sounds: Murmur heard.  ?   Comments: 3/6 systolic murmur along the right sternal border.  There is no diastolic murmur. ?Pulmonary:  ?   Effort: Pulmonary effort is normal.  ?   Breath sounds: Normal breath sounds.  ?Abdominal:  ?   General: There is no distension.  ?   Tenderness: There is no abdominal tenderness.  ?Musculoskeletal:     ?   General: No swelling. Normal range of motion.  ?   Cervical back: Normal range of motion and neck supple.  ?Skin: ?   General: Skin is warm and dry.  ?Neurological:  ?   General: No focal deficit present.  ?   Mental Status: He is alert and oriented to person, place, and time.  ?Psychiatric:     ?   Mood and Affect: Mood normal.     ?   Behavior: Behavior normal.  ?  ?  ?  ?Diagnostic Tests: ?  ? ?   ECHOCARDIOGRAM REPORT    ? ?   ? ?Patient Name:   Randall Flores. Date of Exam: 03/09/2021  ?Medical Rec #:  [id number]           Height:       71.0 in  ?Accession #:    [id number]          Weight:       216.0 lb  ?Date of Birth:  05/18/1955           BSA:          2.179 m?  ?Patient Age:    81 years            BP:           130/70 mmHg  ?Patient Gender: M                   HR:           73 bpm.  ?Exam Location:  [facility]  ? ?Procedure: 2D Echo, Cardiac Doppler and Color Doppler  ? ?Indications:     I35.0 Aortic valve stenosis, etiology of cardiac valve  ?disease  ?                  unspecified.  ?                 Severe aortic valve stenosis  ?   ?History:         Patient has prior history of Echocardiogram examinations,  ?most  ?                 recent 02/27/2021. Signs/Symptoms:Murmur; Risk

## 2021-04-16 ENCOUNTER — Inpatient Hospital Stay (HOSPITAL_COMMUNITY): Payer: Medicare HMO | Admitting: Physician Assistant

## 2021-04-16 ENCOUNTER — Encounter (HOSPITAL_COMMUNITY): Payer: Self-pay | Admitting: Surgery

## 2021-04-16 ENCOUNTER — Inpatient Hospital Stay (HOSPITAL_COMMUNITY)
Admission: RE | Admit: 2021-04-16 | Discharge: 2021-04-20 | DRG: 220 | Disposition: A | Discharge status: Home / Self Care | Payer: Medicare HMO | Attending: Surgery | Admitting: Surgery

## 2021-04-16 ENCOUNTER — Other Ambulatory Visit: Payer: Self-pay

## 2021-04-16 ENCOUNTER — Inpatient Hospital Stay (HOSPITAL_COMMUNITY): Payer: Medicare HMO

## 2021-04-16 ENCOUNTER — Encounter (HOSPITAL_COMMUNITY)
Admission: RE | Disposition: A | Discharge status: Home / Self Care | Payer: Self-pay | Source: Home / Self Care | Attending: Surgery

## 2021-04-16 ENCOUNTER — Inpatient Hospital Stay (HOSPITAL_COMMUNITY): Payer: Medicare HMO | Admitting: Anesthesiology

## 2021-04-16 DIAGNOSIS — I5032 Chronic diastolic (congestive) heart failure: Secondary | ICD-10-CM | POA: Diagnosis present

## 2021-04-16 DIAGNOSIS — Z79899 Other long term (current) drug therapy: Secondary | ICD-10-CM | POA: Diagnosis not present

## 2021-04-16 DIAGNOSIS — M19041 Primary osteoarthritis, right hand: Secondary | ICD-10-CM | POA: Diagnosis present

## 2021-04-16 DIAGNOSIS — Z96651 Presence of right artificial knee joint: Secondary | ICD-10-CM | POA: Diagnosis present

## 2021-04-16 DIAGNOSIS — F329 Major depressive disorder, single episode, unspecified: Secondary | ICD-10-CM | POA: Diagnosis present

## 2021-04-16 DIAGNOSIS — E039 Hypothyroidism, unspecified: Secondary | ICD-10-CM | POA: Diagnosis present

## 2021-04-16 DIAGNOSIS — K760 Fatty (change of) liver, not elsewhere classified: Secondary | ICD-10-CM | POA: Diagnosis present

## 2021-04-16 DIAGNOSIS — Z87442 Personal history of urinary calculi: Secondary | ICD-10-CM

## 2021-04-16 DIAGNOSIS — F419 Anxiety disorder, unspecified: Secondary | ICD-10-CM | POA: Diagnosis present

## 2021-04-16 DIAGNOSIS — Z888 Allergy status to other drugs, medicaments and biological substances status: Secondary | ICD-10-CM

## 2021-04-16 DIAGNOSIS — Z91041 Radiographic dye allergy status: Secondary | ICD-10-CM

## 2021-04-16 DIAGNOSIS — Z20822 Contact with and (suspected) exposure to covid-19: Secondary | ICD-10-CM | POA: Diagnosis present

## 2021-04-16 DIAGNOSIS — M47819 Spondylosis without myelopathy or radiculopathy, site unspecified: Secondary | ICD-10-CM | POA: Diagnosis present

## 2021-04-16 DIAGNOSIS — N400 Enlarged prostate without lower urinary tract symptoms: Secondary | ICD-10-CM | POA: Diagnosis present

## 2021-04-16 DIAGNOSIS — I110 Hypertensive heart disease with heart failure: Secondary | ICD-10-CM | POA: Diagnosis present

## 2021-04-16 DIAGNOSIS — I350 Nonrheumatic aortic (valve) stenosis: Secondary | ICD-10-CM

## 2021-04-16 DIAGNOSIS — I10 Essential (primary) hypertension: Secondary | ICD-10-CM | POA: Diagnosis not present

## 2021-04-16 DIAGNOSIS — Z885 Allergy status to narcotic agent status: Secondary | ICD-10-CM

## 2021-04-16 DIAGNOSIS — Z952 Presence of prosthetic heart valve: Secondary | ICD-10-CM

## 2021-04-16 DIAGNOSIS — Q231 Congenital insufficiency of aortic valve: Principal | ICD-10-CM

## 2021-04-16 DIAGNOSIS — I4891 Unspecified atrial fibrillation: Secondary | ICD-10-CM

## 2021-04-16 DIAGNOSIS — Z79890 Hormone replacement therapy (postmenopausal): Secondary | ICD-10-CM | POA: Diagnosis not present

## 2021-04-16 DIAGNOSIS — M19042 Primary osteoarthritis, left hand: Secondary | ICD-10-CM | POA: Diagnosis present

## 2021-04-16 DIAGNOSIS — R7303 Prediabetes: Secondary | ICD-10-CM | POA: Diagnosis present

## 2021-04-16 DIAGNOSIS — E7800 Pure hypercholesterolemia, unspecified: Secondary | ICD-10-CM | POA: Diagnosis present

## 2021-04-16 DIAGNOSIS — D62 Acute posthemorrhagic anemia: Secondary | ICD-10-CM | POA: Diagnosis not present

## 2021-04-16 DIAGNOSIS — Z972 Presence of dental prosthetic device (complete) (partial): Secondary | ICD-10-CM

## 2021-04-16 DIAGNOSIS — Z8673 Personal history of transient ischemic attack (TIA), and cerebral infarction without residual deficits: Secondary | ICD-10-CM

## 2021-04-16 DIAGNOSIS — K5790 Diverticulosis of intestine, part unspecified, without perforation or abscess without bleeding: Secondary | ICD-10-CM | POA: Diagnosis present

## 2021-04-16 DIAGNOSIS — Z91013 Allergy to seafood: Secondary | ICD-10-CM

## 2021-04-16 DIAGNOSIS — Z953 Presence of xenogenic heart valve: Principal | ICD-10-CM

## 2021-04-16 HISTORY — PX: TEE WITHOUT CARDIOVERSION: SHX5443

## 2021-04-16 HISTORY — PX: AORTIC VALVE REPLACEMENT: SHX41

## 2021-04-16 HISTORY — DX: Unspecified atrial fibrillation: I48.91

## 2021-04-16 LAB — BLOOD GAS, ARTERIAL
Acid-Base Excess: 0 mmol/L (ref 0.0–2.0)
Bicarbonate: 24.8 mmol/L (ref 20.0–28.0)
Drawn by: 31717
O2 Saturation: 98.4 %
Patient temperature: 37
pCO2 arterial: 40 mmHg (ref 32–48)
pH, Arterial: 7.4 (ref 7.35–7.45)
pO2, Arterial: 170 mmHg — ABNORMAL HIGH (ref 83–108)

## 2021-04-16 LAB — POCT I-STAT 7, (LYTES, BLD GAS, ICA,H+H)
Acid-Base Excess: 1 mmol/L (ref 0.0–2.0)
Acid-base deficit: 1 mmol/L (ref 0.0–2.0)
Acid-base deficit: 1 mmol/L (ref 0.0–2.0)
Acid-base deficit: 3 mmol/L — ABNORMAL HIGH (ref 0.0–2.0)
Acid-base deficit: 4 mmol/L — ABNORMAL HIGH (ref 0.0–2.0)
Acid-base deficit: 5 mmol/L — ABNORMAL HIGH (ref 0.0–2.0)
Acid-base deficit: 5 mmol/L — ABNORMAL HIGH (ref 0.0–2.0)
Acid-base deficit: 5 mmol/L — ABNORMAL HIGH (ref 0.0–2.0)
Acid-base deficit: 5 mmol/L — ABNORMAL HIGH (ref 0.0–2.0)
Bicarbonate: 26.3 mmol/L (ref 20.0–28.0)
Bicarbonate: 24.2 mmol/L (ref 20.0–28.0)
Bicarbonate: 25.8 mmol/L (ref 20.0–28.0)
Bicarbonate: 22.6 mmol/L (ref 20.0–28.0)
Bicarbonate: 21.2 mmol/L (ref 20.0–28.0)
Bicarbonate: 21.5 mmol/L (ref 20.0–28.0)
Bicarbonate: 21.4 mmol/L (ref 20.0–28.0)
Bicarbonate: 21 mmol/L (ref 20.0–28.0)
Bicarbonate: 21.2 mmol/L (ref 20.0–28.0)
Calcium, Ion: 1.28 mmol/L (ref 1.15–1.40)
Calcium, Ion: 0.99 mmol/L — ABNORMAL LOW (ref 1.15–1.40)
Calcium, Ion: 1.09 mmol/L — ABNORMAL LOW (ref 1.15–1.40)
Calcium, Ion: 1.09 mmol/L — ABNORMAL LOW (ref 1.15–1.40)
Calcium, Ion: 1.12 mmol/L — ABNORMAL LOW (ref 1.15–1.40)
Calcium, Ion: 1.3 mmol/L (ref 1.15–1.40)
Calcium, Ion: 1.28 mmol/L (ref 1.15–1.40)
Calcium, Ion: 1.21 mmol/L (ref 1.15–1.40)
Calcium, Ion: 1.21 mmol/L (ref 1.15–1.40)
HCT: 36 % — ABNORMAL LOW (ref 39.0–52.0)
HCT: 26 % — ABNORMAL LOW (ref 39.0–52.0)
HCT: 27 % — ABNORMAL LOW (ref 39.0–52.0)
HCT: 26 % — ABNORMAL LOW (ref 39.0–52.0)
HCT: 27 % — ABNORMAL LOW (ref 39.0–52.0)
HCT: 27 % — ABNORMAL LOW (ref 39.0–52.0)
HCT: 28 % — ABNORMAL LOW (ref 39.0–52.0)
HCT: 31 % — ABNORMAL LOW (ref 39.0–52.0)
HCT: 31 % — ABNORMAL LOW (ref 39.0–52.0)
Hemoglobin: 12.2 g/dL — ABNORMAL LOW (ref 13.0–17.0)
Hemoglobin: 8.8 g/dL — ABNORMAL LOW (ref 13.0–17.0)
Hemoglobin: 9.2 g/dL — ABNORMAL LOW (ref 13.0–17.0)
Hemoglobin: 8.8 g/dL — ABNORMAL LOW (ref 13.0–17.0)
Hemoglobin: 9.2 g/dL — ABNORMAL LOW (ref 13.0–17.0)
Hemoglobin: 9.2 g/dL — ABNORMAL LOW (ref 13.0–17.0)
Hemoglobin: 9.5 g/dL — ABNORMAL LOW (ref 13.0–17.0)
Hemoglobin: 10.5 g/dL — ABNORMAL LOW (ref 13.0–17.0)
Hemoglobin: 10.5 g/dL — ABNORMAL LOW (ref 13.0–17.0)
O2 Saturation: 100 %
O2 Saturation: 100 %
O2 Saturation: 100 %
O2 Saturation: 100 %
O2 Saturation: 100 %
O2 Saturation: 99 %
O2 Saturation: 98 %
O2 Saturation: 99 %
O2 Saturation: 97 %
Patient temperature: 36.1
Patient temperature: 35.7
Patient temperature: 35.9
Potassium: 4 mmol/L (ref 3.5–5.1)
Potassium: 4 mmol/L (ref 3.5–5.1)
Potassium: 4.6 mmol/L (ref 3.5–5.1)
Potassium: 4.4 mmol/L (ref 3.5–5.1)
Potassium: 3.9 mmol/L (ref 3.5–5.1)
Potassium: 4.5 mmol/L (ref 3.5–5.1)
Potassium: 4.2 mmol/L (ref 3.5–5.1)
Potassium: 4.1 mmol/L (ref 3.5–5.1)
Potassium: 4 mmol/L (ref 3.5–5.1)
Sodium: 136 mmol/L (ref 135–145)
Sodium: 131 mmol/L — ABNORMAL LOW (ref 135–145)
Sodium: 134 mmol/L — ABNORMAL LOW (ref 135–145)
Sodium: 131 mmol/L — ABNORMAL LOW (ref 135–145)
Sodium: 133 mmol/L — ABNORMAL LOW (ref 135–145)
Sodium: 135 mmol/L (ref 135–145)
Sodium: 135 mmol/L (ref 135–145)
Sodium: 137 mmol/L (ref 135–145)
Sodium: 136 mmol/L (ref 135–145)
TCO2: 28 mmol/L (ref 22–32)
TCO2: 25 mmol/L (ref 22–32)
TCO2: 27 mmol/L (ref 22–32)
TCO2: 24 mmol/L (ref 22–32)
TCO2: 23 mmol/L (ref 22–32)
TCO2: 23 mmol/L (ref 22–32)
TCO2: 23 mmol/L (ref 22–32)
TCO2: 22 mmol/L (ref 22–32)
TCO2: 22 mmol/L (ref 22–32)
pCO2 arterial: 53.3 mmHg — ABNORMAL HIGH (ref 32–48)
pCO2 arterial: 40.3 mmHg (ref 32–48)
pCO2 arterial: 41.9 mmHg (ref 32–48)
pCO2 arterial: 40.7 mmHg (ref 32–48)
pCO2 arterial: 39 mmHg (ref 32–48)
pCO2 arterial: 45.3 mmHg (ref 32–48)
pCO2 arterial: 40.6 mmHg (ref 32–48)
pCO2 arterial: 37.9 mmHg (ref 32–48)
pCO2 arterial: 39.4 mmHg (ref 32–48)
pH, Arterial: 7.301 — ABNORMAL LOW (ref 7.35–7.45)
pH, Arterial: 7.371 (ref 7.35–7.45)
pH, Arterial: 7.415 (ref 7.35–7.45)
pH, Arterial: 7.353 (ref 7.35–7.45)
pH, Arterial: 7.278 — ABNORMAL LOW (ref 7.35–7.45)
pH, Arterial: 7.349 — ABNORMAL LOW (ref 7.35–7.45)
pH, Arterial: 7.325 — ABNORMAL LOW (ref 7.35–7.45)
pH, Arterial: 7.346 — ABNORMAL LOW (ref 7.35–7.45)
pH, Arterial: 7.333 — ABNORMAL LOW (ref 7.35–7.45)
pO2, Arterial: 340 mmHg — ABNORMAL HIGH (ref 83–108)
pO2, Arterial: 343 mmHg — ABNORMAL HIGH (ref 83–108)
pO2, Arterial: 430 mmHg — ABNORMAL HIGH (ref 83–108)
pO2, Arterial: 366 mmHg — ABNORMAL HIGH (ref 83–108)
pO2, Arterial: 171 mmHg — ABNORMAL HIGH (ref 83–108)
pO2, Arterial: 248 mmHg — ABNORMAL HIGH (ref 83–108)
pO2, Arterial: 116 mmHg — ABNORMAL HIGH (ref 83–108)
pO2, Arterial: 143 mmHg — ABNORMAL HIGH (ref 83–108)
pO2, Arterial: 90 mmHg (ref 83–108)

## 2021-04-16 LAB — CBC
HCT: 29.8 % — ABNORMAL LOW (ref 39.0–52.0)
HCT: 31.4 % — ABNORMAL LOW (ref 39.0–52.0)
Hemoglobin: 10 g/dL — ABNORMAL LOW (ref 13.0–17.0)
Hemoglobin: 10.9 g/dL — ABNORMAL LOW (ref 13.0–17.0)
MCH: 30 pg (ref 26.0–34.0)
MCH: 30.8 pg (ref 26.0–34.0)
MCHC: 33.6 g/dL (ref 30.0–36.0)
MCHC: 34.7 g/dL (ref 30.0–36.0)
MCV: 89.5 fL (ref 80.0–100.0)
MCV: 88.7 fL (ref 80.0–100.0)
Platelets: 151 10*3/uL (ref 150–400)
Platelets: 176 10*3/uL (ref 150–400)
RBC: 3.33 MIL/uL — ABNORMAL LOW (ref 4.22–5.81)
RBC: 3.54 MIL/uL — ABNORMAL LOW (ref 4.22–5.81)
RDW: 12.2 % (ref 11.5–15.5)
RDW: 12.3 % (ref 11.5–15.5)
WBC: 12.5 10*3/uL — ABNORMAL HIGH (ref 4.0–10.5)
WBC: 16.2 10*3/uL — ABNORMAL HIGH (ref 4.0–10.5)
nRBC: 0 % (ref 0.0–0.2)
nRBC: 0 % (ref 0.0–0.2)

## 2021-04-16 LAB — POCT I-STAT EG7
Acid-base deficit: 1 mmol/L (ref 0.0–2.0)
Bicarbonate: 24.7 mmol/L (ref 20.0–28.0)
Calcium, Ion: 1.12 mmol/L — ABNORMAL LOW (ref 1.15–1.40)
HCT: 29 % — ABNORMAL LOW (ref 39.0–52.0)
Hemoglobin: 9.9 g/dL — ABNORMAL LOW (ref 13.0–17.0)
O2 Saturation: 78 %
Potassium: 4 mmol/L (ref 3.5–5.1)
Sodium: 143 mmol/L (ref 135–145)
TCO2: 26 mmol/L (ref 22–32)
pCO2, Ven: 43.4 mmHg — ABNORMAL LOW (ref 44–60)
pH, Ven: 7.363 (ref 7.25–7.43)
pO2, Ven: 44 mmHg (ref 32–45)

## 2021-04-16 LAB — PLATELET COUNT: Platelets: 218 10*3/uL (ref 150–400)

## 2021-04-16 LAB — POCT I-STAT, CHEM 8
BUN: 16 mg/dL (ref 8–23)
BUN: 15 mg/dL (ref 8–23)
BUN: 16 mg/dL (ref 8–23)
BUN: 16 mg/dL (ref 8–23)
BUN: 17 mg/dL (ref 8–23)
Calcium, Ion: 1.29 mmol/L (ref 1.15–1.40)
Calcium, Ion: 1.24 mmol/L (ref 1.15–1.40)
Calcium, Ion: 1.09 mmol/L — ABNORMAL LOW (ref 1.15–1.40)
Calcium, Ion: 1.1 mmol/L — ABNORMAL LOW (ref 1.15–1.40)
Calcium, Ion: 1.28 mmol/L (ref 1.15–1.40)
Chloride: 101 mmol/L (ref 98–111)
Chloride: 101 mmol/L (ref 98–111)
Chloride: 99 mmol/L (ref 98–111)
Chloride: 101 mmol/L (ref 98–111)
Chloride: 103 mmol/L (ref 98–111)
Creatinine, Ser: 0.9 mg/dL (ref 0.61–1.24)
Creatinine, Ser: 0.9 mg/dL (ref 0.61–1.24)
Creatinine, Ser: 0.9 mg/dL (ref 0.61–1.24)
Creatinine, Ser: 0.9 mg/dL (ref 0.61–1.24)
Creatinine, Ser: 1 mg/dL (ref 0.61–1.24)
Glucose, Bld: 105 mg/dL — ABNORMAL HIGH (ref 70–99)
Glucose, Bld: 134 mg/dL — ABNORMAL HIGH (ref 70–99)
Glucose, Bld: 165 mg/dL — ABNORMAL HIGH (ref 70–99)
Glucose, Bld: 177 mg/dL — ABNORMAL HIGH (ref 70–99)
Glucose, Bld: 190 mg/dL — ABNORMAL HIGH (ref 70–99)
HCT: 38 % — ABNORMAL LOW (ref 39.0–52.0)
HCT: 34 % — ABNORMAL LOW (ref 39.0–52.0)
HCT: 27 % — ABNORMAL LOW (ref 39.0–52.0)
HCT: 29 % — ABNORMAL LOW (ref 39.0–52.0)
HCT: 29 % — ABNORMAL LOW (ref 39.0–52.0)
Hemoglobin: 12.9 g/dL — ABNORMAL LOW (ref 13.0–17.0)
Hemoglobin: 11.6 g/dL — ABNORMAL LOW (ref 13.0–17.0)
Hemoglobin: 9.2 g/dL — ABNORMAL LOW (ref 13.0–17.0)
Hemoglobin: 9.9 g/dL — ABNORMAL LOW (ref 13.0–17.0)
Hemoglobin: 9.9 g/dL — ABNORMAL LOW (ref 13.0–17.0)
Potassium: 4 mmol/L (ref 3.5–5.1)
Potassium: 4.1 mmol/L (ref 3.5–5.1)
Potassium: 4.1 mmol/L (ref 3.5–5.1)
Potassium: 3.8 mmol/L (ref 3.5–5.1)
Potassium: 4.5 mmol/L (ref 3.5–5.1)
Sodium: 136 mmol/L (ref 135–145)
Sodium: 134 mmol/L — ABNORMAL LOW (ref 135–145)
Sodium: 132 mmol/L — ABNORMAL LOW (ref 135–145)
Sodium: 133 mmol/L — ABNORMAL LOW (ref 135–145)
Sodium: 135 mmol/L (ref 135–145)
TCO2: 28 mmol/L (ref 22–32)
TCO2: 25 mmol/L (ref 22–32)
TCO2: 25 mmol/L (ref 22–32)
TCO2: 22 mmol/L (ref 22–32)
TCO2: 22 mmol/L (ref 22–32)

## 2021-04-16 LAB — GLUCOSE, CAPILLARY
Glucose-Capillary: 174 mg/dL — ABNORMAL HIGH (ref 70–99)
Glucose-Capillary: 144 mg/dL — ABNORMAL HIGH (ref 70–99)
Glucose-Capillary: 172 mg/dL — ABNORMAL HIGH (ref 70–99)
Glucose-Capillary: 171 mg/dL — ABNORMAL HIGH (ref 70–99)
Glucose-Capillary: 172 mg/dL — ABNORMAL HIGH (ref 70–99)
Glucose-Capillary: 174 mg/dL — ABNORMAL HIGH (ref 70–99)
Glucose-Capillary: 177 mg/dL — ABNORMAL HIGH (ref 70–99)
Glucose-Capillary: 188 mg/dL — ABNORMAL HIGH (ref 70–99)
Glucose-Capillary: 199 mg/dL — ABNORMAL HIGH (ref 70–99)
Glucose-Capillary: 182 mg/dL — ABNORMAL HIGH (ref 70–99)
Glucose-Capillary: 165 mg/dL — ABNORMAL HIGH (ref 70–99)
Glucose-Capillary: 146 mg/dL — ABNORMAL HIGH (ref 70–99)

## 2021-04-16 LAB — BASIC METABOLIC PANEL
Anion gap: 9 (ref 5–15)
BUN: 17 mg/dL (ref 8–23)
CO2: 20 mmol/L — ABNORMAL LOW (ref 22–32)
Calcium: 8.2 mg/dL — ABNORMAL LOW (ref 8.9–10.3)
Chloride: 107 mmol/L (ref 98–111)
Creatinine, Ser: 1.13 mg/dL (ref 0.61–1.24)
GFR, Estimated: >60 mL/min (ref >60)
Glucose, Bld: 174 mg/dL — ABNORMAL HIGH (ref 70–99)
Potassium: 4 mmol/L (ref 3.5–5.1)
Sodium: 136 mmol/L (ref 135–145)

## 2021-04-16 LAB — ECHO INTRAOPERATIVE TEE
AV Mean grad: 22 mmHg
Height: 71 in
S' Lateral: 35 cm
Weight: 3456 [oz_av]

## 2021-04-16 LAB — APTT: aPTT: 31 seconds (ref 24–36)

## 2021-04-16 LAB — PROTIME-INR
INR: 1.4 — ABNORMAL HIGH (ref 0.8–1.2)
Prothrombin Time: 16.8 seconds — ABNORMAL HIGH (ref 11.4–15.2)

## 2021-04-16 LAB — HEMOGLOBIN AND HEMATOCRIT, BLOOD
HCT: 26.6 % — ABNORMAL LOW (ref 39.0–52.0)
Hemoglobin: 9.2 g/dL — ABNORMAL LOW (ref 13.0–17.0)

## 2021-04-16 LAB — MAGNESIUM: Magnesium: 3 mg/dL — ABNORMAL HIGH (ref 1.7–2.4)

## 2021-04-16 SURGERY — REPLACEMENT, AORTIC VALVE, OPEN
Anesthesia: General | Site: Chest

## 2021-04-16 MED ORDER — LACTATED RINGERS IV SOLN
INTRAVENOUS | Status: DC | PRN
Start: 2021-04-16 — End: 2021-04-16

## 2021-04-16 MED ORDER — MIDAZOLAM HCL 2 MG/2ML IJ SOLN
INTRAMUSCULAR | Status: AC
  Filled 2021-04-16: qty 2

## 2021-04-16 MED ORDER — LACTATED RINGERS IV SOLN: INTRAVENOUS | Status: DC

## 2021-04-16 MED ORDER — MORPHINE SULFATE (PF) 2 MG/ML IV SOLN
1.0000 mg | INTRAVENOUS | Status: DC | PRN
  Administered 2021-04-16: 2 mg via INTRAVENOUS
  Administered 2021-04-16: 4 mg via INTRAVENOUS
  Administered 2021-04-16: 2 mg via INTRAVENOUS
  Administered 2021-04-16 – 2021-04-17 (×3): 4 mg via INTRAVENOUS
  Administered 2021-04-17: 2 mg via INTRAVENOUS
  Filled 2021-04-16 (×2): qty 1
  Filled 2021-04-16: qty 2
  Filled 2021-04-16: qty 1
  Filled 2021-04-16 (×3): qty 2
  Filled 2021-04-16: qty 1

## 2021-04-16 MED ORDER — DEXMEDETOMIDINE HCL IN NACL 400 MCG/100ML IV SOLN
0.0000 ug/kg/h | INTRAVENOUS | Status: DC
  Administered 2021-04-16: 0.4 ug/kg/h via INTRAVENOUS
  Filled 2021-04-16: qty 100

## 2021-04-16 MED ORDER — ALBUMIN HUMAN 5 % IV SOLN
250.0000 mL | INTRAVENOUS | Status: DC | PRN
  Administered 2021-04-16: 12.5 g via INTRAVENOUS

## 2021-04-16 MED ORDER — LACTATED RINGERS IV SOLN: INTRAVENOUS | Status: DC | PRN

## 2021-04-16 MED ORDER — FENTANYL CITRATE (PF) 250 MCG/5ML IJ SOLN
INTRAMUSCULAR | Status: AC
  Filled 2021-04-16: qty 5

## 2021-04-16 MED ORDER — ALBUMIN HUMAN 5 % IV SOLN: INTRAVENOUS | Status: DC | PRN

## 2021-04-16 MED ORDER — SODIUM CHLORIDE 0.9% FLUSH: 10.0000 mL | INTRAVENOUS | Status: DC | PRN

## 2021-04-16 MED ORDER — PANTOPRAZOLE SODIUM 40 MG PO TBEC
40.0000 mg | DELAYED_RELEASE_TABLET | Freq: Every day | ORAL | Status: DC
  Administered 2021-04-18 – 2021-04-20 (×3): 40 mg via ORAL
  Filled 2021-04-16 (×3): qty 1

## 2021-04-16 MED ORDER — PHENYLEPHRINE HCL-NACL 20-0.9 MG/250ML-% IV SOLN: 0.0000 ug/min | INTRAVENOUS | Status: DC

## 2021-04-16 MED ORDER — AMIODARONE HCL IN DEXTROSE 360-4.14 MG/200ML-% IV SOLN
60.0000 mg/h | INTRAVENOUS | Status: AC
  Administered 2021-04-16 (×2): 60 mg/h via INTRAVENOUS
  Filled 2021-04-16: qty 200

## 2021-04-16 MED ORDER — CITALOPRAM HYDROBROMIDE 20 MG PO TABS
20.0000 mg | ORAL_TABLET | Freq: Every day | ORAL | Status: DC
  Administered 2021-04-17 – 2021-04-20 (×4): 20 mg via ORAL
  Filled 2021-04-16 (×4): qty 1

## 2021-04-16 MED ORDER — DIPHENHYDRAMINE HCL 50 MG/ML IJ SOLN
INTRAMUSCULAR | Status: DC | PRN
  Administered 2021-04-16: 25 mg via INTRAVENOUS

## 2021-04-16 MED ORDER — 0.9 % SODIUM CHLORIDE (POUR BTL) OPTIME
TOPICAL | Status: DC | PRN
  Administered 2021-04-16: 5000 mL

## 2021-04-16 MED ORDER — SODIUM CHLORIDE 0.45 % IV SOLN: INTRAVENOUS | Status: DC | PRN

## 2021-04-16 MED ORDER — MIDAZOLAM HCL 2 MG/2ML IJ SOLN: 2.0000 mg | INTRAMUSCULAR | Status: DC | PRN

## 2021-04-16 MED ORDER — THROMBIN 20000 UNITS EX SOLR
OROMUCOSAL | Status: DC | PRN
  Administered 2021-04-16 (×3): 4 mL via TOPICAL

## 2021-04-16 MED ORDER — AMIODARONE HCL IN DEXTROSE 360-4.14 MG/200ML-% IV SOLN
30.0000 mg/h | INTRAVENOUS | Status: DC
Start: 2021-04-16 — End: 2021-04-17
  Administered 2021-04-17: 30 mg/h via INTRAVENOUS
  Filled 2021-04-16: qty 200

## 2021-04-16 MED ORDER — THROMBIN (RECOMBINANT) 20000 UNITS EX SOLR
CUTANEOUS | Status: AC
  Filled 2021-04-16: qty 20000

## 2021-04-16 MED ORDER — POTASSIUM CHLORIDE 10 MEQ/50ML IV SOLN: 10.0000 meq | INTRAVENOUS | Status: AC

## 2021-04-16 MED ORDER — ACETAMINOPHEN 650 MG RE SUPP
650.0000 mg | Freq: Once | RECTAL | Status: AC
  Administered 2021-04-16: 650 mg via RECTAL

## 2021-04-16 MED ORDER — TRAMADOL HCL 50 MG PO TABS
50.0000 mg | ORAL_TABLET | ORAL | Status: DC | PRN
  Administered 2021-04-16 – 2021-04-18 (×4): 100 mg via ORAL
  Filled 2021-04-16 (×4): qty 2

## 2021-04-16 MED ORDER — SODIUM CHLORIDE 0.9% FLUSH
3.0000 mL | Freq: Two times a day (BID) | INTRAVENOUS | Status: DC
  Administered 2021-04-17 – 2021-04-20 (×7): 3 mL via INTRAVENOUS

## 2021-04-16 MED ORDER — SODIUM CHLORIDE 0.9 % IV SOLN: 250.0000 mL | INTRAVENOUS | Status: DC

## 2021-04-16 MED ORDER — PHENYLEPHRINE 40 MCG/ML (10ML) SYRINGE FOR IV PUSH (FOR BLOOD PRESSURE SUPPORT)
PREFILLED_SYRINGE | INTRAVENOUS | Status: DC | PRN
  Administered 2021-04-16 (×2): 40 ug via INTRAVENOUS
  Administered 2021-04-16: 80 ug via INTRAVENOUS

## 2021-04-16 MED ORDER — SODIUM CHLORIDE 0.9% FLUSH: 3.0000 mL | INTRAVENOUS | Status: DC | PRN

## 2021-04-16 MED ORDER — VANCOMYCIN HCL IN DEXTROSE 1-5 GM/200ML-% IV SOLN
1000.0000 mg | Freq: Once | INTRAVENOUS | Status: AC
  Administered 2021-04-16: 1000 mg via INTRAVENOUS
  Filled 2021-04-16: qty 200

## 2021-04-16 MED ORDER — DOCUSATE SODIUM 100 MG PO CAPS
200.0000 mg | ORAL_CAPSULE | Freq: Every day | ORAL | Status: DC
  Administered 2021-04-17 – 2021-04-20 (×4): 200 mg via ORAL
  Filled 2021-04-16 (×4): qty 2

## 2021-04-16 MED ORDER — CHLORHEXIDINE GLUCONATE 0.12 % MT SOLN
15.0000 mL | OROMUCOSAL | Status: AC
  Administered 2021-04-16: 15 mL via OROMUCOSAL

## 2021-04-16 MED ORDER — PROTAMINE SULFATE 10 MG/ML IV SOLN
INTRAVENOUS | Status: AC
  Filled 2021-04-16: qty 5

## 2021-04-16 MED ORDER — INSULIN REGULAR(HUMAN) IN NACL 100-0.9 UT/100ML-% IV SOLN
INTRAVENOUS | Status: DC
  Administered 2021-04-17: 1.9 [IU]/h via INTRAVENOUS
  Filled 2021-04-16: qty 100

## 2021-04-16 MED ORDER — METOPROLOL TARTRATE 5 MG/5ML IV SOLN: 2.5000 mg | INTRAVENOUS | Status: DC | PRN

## 2021-04-16 MED ORDER — CHLORHEXIDINE GLUCONATE CLOTH 2 % EX PADS
6.0000 | MEDICATED_PAD | Freq: Every day | CUTANEOUS | Status: DC
  Administered 2021-04-16 – 2021-04-18 (×3): 6 via TOPICAL

## 2021-04-16 MED ORDER — AMIODARONE HCL IN DEXTROSE 360-4.14 MG/200ML-% IV SOLN
INTRAVENOUS | Status: DC | PRN
  Administered 2021-04-16: 60 mg/h via INTRAVENOUS

## 2021-04-16 MED ORDER — HEMOSTATIC AGENTS (NO CHARGE) OPTIME
TOPICAL | Status: DC | PRN
  Administered 2021-04-16: 1 via TOPICAL

## 2021-04-16 MED ORDER — NITROGLYCERIN 0.2 MG/ML ON CALL CATH LAB
INTRAVENOUS | Status: DC | PRN
  Administered 2021-04-16 (×5): 20 ug via INTRAVENOUS

## 2021-04-16 MED ORDER — SODIUM CHLORIDE 0.9 % IV SOLN: INTRAVENOUS | Status: DC

## 2021-04-16 MED ORDER — AMIODARONE IV BOLUS ONLY 150 MG/100ML
INTRAVENOUS | Status: DC | PRN
  Administered 2021-04-16: 150 mg via INTRAVENOUS

## 2021-04-16 MED ORDER — TAMSULOSIN HCL 0.4 MG PO CAPS
0.4000 mg | ORAL_CAPSULE | Freq: Every day | ORAL | Status: DC
  Administered 2021-04-17 – 2021-04-20 (×4): 0.4 mg via ORAL
  Filled 2021-04-16 (×4): qty 1

## 2021-04-16 MED ORDER — ARTIFICIAL TEARS OPHTHALMIC OINT
TOPICAL_OINTMENT | OPHTHALMIC | Status: AC
  Filled 2021-04-16: qty 3.5

## 2021-04-16 MED ORDER — METOPROLOL TARTRATE 12.5 MG HALF TABLET: 12.5000 mg | ORAL_TABLET | Freq: Two times a day (BID) | ORAL | Status: DC

## 2021-04-16 MED ORDER — PROTAMINE SULFATE 10 MG/ML IV SOLN
INTRAVENOUS | Status: DC | PRN
  Administered 2021-04-16: 300 mg via INTRAVENOUS

## 2021-04-16 MED ORDER — SODIUM CHLORIDE 0.9 % IV SOLN: INTRAVENOUS | Status: DC | PRN

## 2021-04-16 MED ORDER — CHLORHEXIDINE GLUCONATE 0.12 % MT SOLN
15.0000 mL | Freq: Once | OROMUCOSAL | Status: AC
  Administered 2021-04-16: 15 mL via OROMUCOSAL
  Filled 2021-04-16: qty 15

## 2021-04-16 MED ORDER — FAMOTIDINE IN NACL 20-0.9 MG/50ML-% IV SOLN
20.0000 mg | Freq: Two times a day (BID) | INTRAVENOUS | Status: DC
  Administered 2021-04-16: 20 mg via INTRAVENOUS
  Filled 2021-04-16: qty 50

## 2021-04-16 MED ORDER — ACETAMINOPHEN 160 MG/5ML PO SOLN: 1000.0000 mg | Freq: Four times a day (QID) | ORAL | Status: DC

## 2021-04-16 MED ORDER — HEPARIN SODIUM (PORCINE) 1000 UNIT/ML IJ SOLN
INTRAMUSCULAR | Status: DC | PRN
  Administered 2021-04-16: 30000 [IU] via INTRAVENOUS

## 2021-04-16 MED ORDER — PROPOFOL 10 MG/ML IV BOLUS
INTRAVENOUS | Status: DC | PRN
  Administered 2021-04-16: 160 mg via INTRAVENOUS

## 2021-04-16 MED ORDER — CEFAZOLIN SODIUM-DEXTROSE 2-4 GM/100ML-% IV SOLN
2.0000 g | Freq: Three times a day (TID) | INTRAVENOUS | Status: AC
  Administered 2021-04-16 – 2021-04-18 (×6): 2 g via INTRAVENOUS
  Filled 2021-04-16 (×7): qty 100

## 2021-04-16 MED ORDER — PHENYLEPHRINE 40 MCG/ML (10ML) SYRINGE FOR IV PUSH (FOR BLOOD PRESSURE SUPPORT)
PREFILLED_SYRINGE | INTRAVENOUS | Status: AC
  Filled 2021-04-16: qty 10

## 2021-04-16 MED ORDER — CHLORHEXIDINE GLUCONATE 4 % EX LIQD: 30.0000 mL | CUTANEOUS | Status: DC

## 2021-04-16 MED ORDER — METOPROLOL TARTRATE 25 MG/10 ML ORAL SUSPENSION: 12.5000 mg | Freq: Two times a day (BID) | ORAL | Status: DC

## 2021-04-16 MED ORDER — MAGNESIUM SULFATE 4 GM/100ML IV SOLN
4.0000 g | Freq: Once | INTRAVENOUS | Status: AC
  Administered 2021-04-16: 4 g via INTRAVENOUS
  Filled 2021-04-16: qty 100

## 2021-04-16 MED ORDER — METOPROLOL TARTRATE 12.5 MG HALF TABLET
12.5000 mg | ORAL_TABLET | Freq: Once | ORAL | Status: AC
  Administered 2021-04-16: 12.5 mg via ORAL
  Filled 2021-04-16: qty 1

## 2021-04-16 MED ORDER — MIDAZOLAM HCL (PF) 5 MG/ML IJ SOLN
INTRAMUSCULAR | Status: DC | PRN
  Administered 2021-04-16: 2 mg via INTRAVENOUS
  Administered 2021-04-16: 3 mg via INTRAVENOUS
  Administered 2021-04-16: 2 mg via INTRAVENOUS
  Administered 2021-04-16: 1 mg via INTRAVENOUS
  Administered 2021-04-16 (×3): 2 mg via INTRAVENOUS

## 2021-04-16 MED ORDER — PLASMA-LYTE A IV SOLN
INTRAVENOUS | Status: DC | PRN
  Administered 2021-04-16: 500 mL via INTRAVASCULAR

## 2021-04-16 MED ORDER — DULOXETINE HCL 60 MG PO CPEP
60.0000 mg | ORAL_CAPSULE | Freq: Every day | ORAL | Status: DC
  Administered 2021-04-17 – 2021-04-20 (×4): 60 mg via ORAL
  Filled 2021-04-16 (×4): qty 1

## 2021-04-16 MED ORDER — MIDAZOLAM HCL (PF) 10 MG/2ML IJ SOLN
INTRAMUSCULAR | Status: AC
  Filled 2021-04-16: qty 2

## 2021-04-16 MED ORDER — ROSUVASTATIN CALCIUM 5 MG PO TABS
10.0000 mg | ORAL_TABLET | Freq: Every day | ORAL | Status: DC
  Administered 2021-04-17 – 2021-04-20 (×4): 10 mg via ORAL
  Filled 2021-04-16 (×4): qty 2

## 2021-04-16 MED ORDER — PROTAMINE SULFATE 10 MG/ML IV SOLN
INTRAVENOUS | Status: AC
  Filled 2021-04-16: qty 25

## 2021-04-16 MED ORDER — LEVOTHYROXINE SODIUM 75 MCG PO TABS
150.0000 ug | ORAL_TABLET | Freq: Every day | ORAL | Status: DC
  Administered 2021-04-17 – 2021-04-20 (×4): 150 ug via ORAL
  Filled 2021-04-16: qty 1
  Filled 2021-04-16 (×2): qty 2
  Filled 2021-04-16: qty 1
  Filled 2021-04-16: qty 2

## 2021-04-16 MED ORDER — ONDANSETRON HCL 4 MG/2ML IJ SOLN
4.0000 mg | Freq: Four times a day (QID) | INTRAMUSCULAR | Status: DC | PRN
  Administered 2021-04-16 – 2021-04-18 (×3): 4 mg via INTRAVENOUS
  Filled 2021-04-16 (×3): qty 2

## 2021-04-16 MED ORDER — THROMBIN 20000 UNITS EX SOLR
CUTANEOUS | Status: DC | PRN
  Administered 2021-04-16: 20000 [IU] via TOPICAL

## 2021-04-16 MED ORDER — DEXTROSE 50 % IV SOLN: 0.0000 mL | INTRAVENOUS | Status: DC | PRN

## 2021-04-16 MED ORDER — ACETAMINOPHEN 160 MG/5ML PO SOLN: 650.0000 mg | Freq: Once | ORAL | Status: AC

## 2021-04-16 MED ORDER — BISACODYL 5 MG PO TBEC
10.0000 mg | DELAYED_RELEASE_TABLET | Freq: Every day | ORAL | Status: DC
  Administered 2021-04-17 – 2021-04-20 (×4): 10 mg via ORAL
  Filled 2021-04-16 (×4): qty 2

## 2021-04-16 MED ORDER — ROCURONIUM BROMIDE 10 MG/ML (PF) SYRINGE
PREFILLED_SYRINGE | INTRAVENOUS | Status: DC | PRN
  Administered 2021-04-16: 100 mg via INTRAVENOUS
  Administered 2021-04-16 (×2): 40 mg via INTRAVENOUS

## 2021-04-16 MED ORDER — ACETAMINOPHEN 500 MG PO TABS
1000.0000 mg | ORAL_TABLET | Freq: Four times a day (QID) | ORAL | Status: DC
  Administered 2021-04-17 – 2021-04-20 (×12): 1000 mg via ORAL
  Filled 2021-04-16 (×12): qty 2

## 2021-04-16 MED ORDER — ROCURONIUM BROMIDE 10 MG/ML (PF) SYRINGE
PREFILLED_SYRINGE | INTRAVENOUS | Status: AC
  Filled 2021-04-16: qty 10

## 2021-04-16 MED ORDER — OXYCODONE HCL 5 MG PO TABS
5.0000 mg | ORAL_TABLET | ORAL | Status: DC | PRN
  Administered 2021-04-16 – 2021-04-19 (×12): 10 mg via ORAL
  Filled 2021-04-16 (×12): qty 2

## 2021-04-16 MED ORDER — METHYLPREDNISOLONE SODIUM SUCC 125 MG IJ SOLR
INTRAMUSCULAR | Status: DC | PRN
  Administered 2021-04-16: 125 mg via INTRAVENOUS

## 2021-04-16 MED ORDER — LACTATED RINGERS IV SOLN: 500.0000 mL | Freq: Once | INTRAVENOUS | Status: DC | PRN

## 2021-04-16 MED ORDER — FENTANYL CITRATE (PF) 250 MCG/5ML IJ SOLN
INTRAMUSCULAR | Status: DC | PRN
Start: 2021-04-16 — End: 2021-04-16
  Administered 2021-04-16 (×2): 25 ug via INTRAVENOUS
  Administered 2021-04-16 (×3): 100 ug via INTRAVENOUS
  Administered 2021-04-16: 150 ug via INTRAVENOUS
  Administered 2021-04-16: 50 ug via INTRAVENOUS
  Administered 2021-04-16 (×3): 100 ug via INTRAVENOUS
  Administered 2021-04-16 (×2): 150 ug via INTRAVENOUS
  Administered 2021-04-16: 100 ug via INTRAVENOUS

## 2021-04-16 MED ORDER — BISACODYL 10 MG RE SUPP: 10.0000 mg | Freq: Every day | RECTAL | Status: DC

## 2021-04-16 MED ORDER — HEPARIN SODIUM (PORCINE) 1000 UNIT/ML IJ SOLN
INTRAMUSCULAR | Status: AC
  Filled 2021-04-16: qty 1

## 2021-04-16 MED ORDER — ~~LOC~~ CARDIAC SURGERY, PATIENT & FAMILY EDUCATION
Freq: Once | Status: DC
  Filled 2021-04-16: qty 1

## 2021-04-16 MED ORDER — ASPIRIN EC 325 MG PO TBEC
325.0000 mg | DELAYED_RELEASE_TABLET | Freq: Every day | ORAL | Status: DC
  Administered 2021-04-17 – 2021-04-20 (×4): 325 mg via ORAL
  Filled 2021-04-16 (×4): qty 1

## 2021-04-16 MED ORDER — SODIUM CHLORIDE 0.9% FLUSH
10.0000 mL | Freq: Two times a day (BID) | INTRAVENOUS | Status: DC
  Administered 2021-04-16 – 2021-04-20 (×5): 10 mL

## 2021-04-16 MED ORDER — SODIUM BICARBONATE 8.4 % IV SOLN
50.0000 meq | Freq: Once | INTRAVENOUS | Status: AC
  Administered 2021-04-16: 50 meq via INTRAVENOUS

## 2021-04-16 MED ORDER — PROPOFOL 10 MG/ML IV BOLUS
INTRAVENOUS | Status: AC
  Filled 2021-04-16: qty 20

## 2021-04-16 MED ORDER — MIDAZOLAM HCL (PF) 5 MG/ML IJ SOLN
INTRAMUSCULAR | Status: DC | PRN
Start: 2021-04-16 — End: 2021-04-16

## 2021-04-16 MED ORDER — ASPIRIN 81 MG PO CHEW
324.0000 mg | CHEWABLE_TABLET | Freq: Every day | ORAL | Status: DC
  Filled 2021-04-16: qty 4

## 2021-04-16 MED ORDER — ROCURONIUM BROMIDE 10 MG/ML (PF) SYRINGE
PREFILLED_SYRINGE | INTRAVENOUS | Status: AC
  Filled 2021-04-16: qty 20

## 2021-04-16 MED ORDER — NITROGLYCERIN IN D5W 200-5 MCG/ML-% IV SOLN
0.0000 ug/min | INTRAVENOUS | Status: DC
  Filled 2021-04-16: qty 250

## 2021-04-16 SURGICAL SUPPLY — 76 items
ADAPTER CARDIO PERF ANTE/RETRO (ADAPTER) ×3 IMPLANT
ADPR PRFSN 84XANTGRD RTRGD (ADAPTER) ×2
BAG DECANTER FOR FLEXI CONT (MISCELLANEOUS) ×3 IMPLANT
BLADE CLIPPER SURG (BLADE) ×3 IMPLANT
BLADE STERNUM SYSTEM 6 (BLADE) ×3 IMPLANT
BLADE SURG 15 STRL LF DISP TIS (BLADE) ×2 IMPLANT
BLADE SURG 15 STRL SS (BLADE) ×3
CANISTER SUCT 3000ML PPV (MISCELLANEOUS) ×3 IMPLANT
CANNULA GUNDRY RCSP 15FR (MISCELLANEOUS) ×3 IMPLANT
CATH HEART VENT LEFT (CATHETERS) ×2 IMPLANT
CATH ROBINSON RED A/P 18FR (CATHETERS) ×9 IMPLANT
CATH THORACIC 28FR (CATHETERS) ×1 IMPLANT
CATH THORACIC 36FR (CATHETERS) ×3 IMPLANT
CATH THORACIC 36FR RT ANG (CATHETERS) ×3 IMPLANT
CNTNR URN SCR LID CUP LEK RST (MISCELLANEOUS) ×2 IMPLANT
CONT SPEC 4OZ STRL OR WHT (MISCELLANEOUS) ×3
CONTAINER PROTECT SURGISLUSH (MISCELLANEOUS) ×6 IMPLANT
COVER SURGICAL LIGHT HANDLE (MISCELLANEOUS) ×3 IMPLANT
DEVICE SUT CK QUICK LOAD MINI (Prosthesis & Implant Heart) ×1 IMPLANT
DRAPE CARDIOVASCULAR INCISE (DRAPES) ×3
DRAPE SRG 135X102X78XABS (DRAPES) ×2 IMPLANT
DRAPE WARM FLUID 44X44 (DRAPES) ×3 IMPLANT
DRSG COVADERM 4X14 (GAUZE/BANDAGES/DRESSINGS) ×3 IMPLANT
ELECT CAUTERY BLADE 6.4 (BLADE) ×3 IMPLANT
ELECT REM PT RETURN 9FT ADLT (ELECTROSURGICAL) ×6
ELECTRODE REM PT RTRN 9FT ADLT (ELECTROSURGICAL) ×4 IMPLANT
FELT TEFLON 1X6 (MISCELLANEOUS) ×7 IMPLANT
GAUZE 4X4 16PLY ~~LOC~~+RFID DBL (SPONGE) ×3 IMPLANT
GAUZE SPONGE 4X4 12PLY STRL (GAUZE/BANDAGES/DRESSINGS) ×3 IMPLANT
GLOVE SURG ENC MOIS LTX SZ6 (GLOVE) IMPLANT
GLOVE SURG ENC MOIS LTX SZ6.5 (GLOVE) IMPLANT
GLOVE SURG ENC MOIS LTX SZ7 (GLOVE) IMPLANT
GLOVE SURG ENC MOIS LTX SZ7.5 (GLOVE) IMPLANT
GLOVE SURG MICRO LTX SZ7 (GLOVE) ×6 IMPLANT
GOWN STRL REUS W/ TWL LRG LVL3 (GOWN DISPOSABLE) ×8 IMPLANT
GOWN STRL REUS W/ TWL XL LVL3 (GOWN DISPOSABLE) ×2 IMPLANT
GOWN STRL REUS W/TWL LRG LVL3 (GOWN DISPOSABLE) ×24
GOWN STRL REUS W/TWL XL LVL3 (GOWN DISPOSABLE) ×6
HEMOSTAT POWDER SURGIFOAM 1G (HEMOSTASIS) ×9 IMPLANT
HEMOSTAT SURGICEL 2X14 (HEMOSTASIS) ×3 IMPLANT
KIT BASIN OR (CUSTOM PROCEDURE TRAY) ×3 IMPLANT
KIT CATH CPB BARTLE (MISCELLANEOUS) ×3 IMPLANT
KIT SUCTION CATH 14FR (SUCTIONS) ×3 IMPLANT
KIT SUT CK MINI COMBO 4X17 (Prosthesis & Implant Heart) ×1 IMPLANT
KIT TURNOVER KIT B (KITS) ×3 IMPLANT
LINE VENT (MISCELLANEOUS) ×1 IMPLANT
NS IRRIG 1000ML POUR BTL (IV SOLUTION) ×18 IMPLANT
PACK E OPEN HEART (SUTURE) ×3 IMPLANT
PACK OPEN HEART (CUSTOM PROCEDURE TRAY) ×3 IMPLANT
PAD ARMBOARD 7.5X6 YLW CONV (MISCELLANEOUS) ×6 IMPLANT
POSITIONER HEAD DONUT 9IN (MISCELLANEOUS) ×3 IMPLANT
SET MPS 3-ND DEL (MISCELLANEOUS) ×1 IMPLANT
SPONGE T-LAP 18X18 ~~LOC~~+RFID (SPONGE) ×12 IMPLANT
SPONGE T-LAP 4X18 ~~LOC~~+RFID (SPONGE) ×3 IMPLANT
SUT BONE WAX W31G (SUTURE) ×3 IMPLANT
SUT EB EXC GRN/WHT 2-0 V-5 (SUTURE) ×6 IMPLANT
SUT ETHIBON EXCEL 2-0 V-5 (SUTURE) ×2 IMPLANT
SUT ETHIBOND V-5 VALVE (SUTURE) ×2 IMPLANT
SUT PROLENE 3 0 SH DA (SUTURE) IMPLANT
SUT PROLENE 3 0 SH1 36 (SUTURE) ×3 IMPLANT
SUT PROLENE 4 0 RB 1 (SUTURE) ×12
SUT PROLENE 4-0 RB1 .5 CRCL 36 (SUTURE) ×6 IMPLANT
SUT STEEL 6MS V (SUTURE) IMPLANT
SUT STEEL STERNAL CCS#1 18IN (SUTURE) ×1 IMPLANT
SUT STEEL SZ 6 DBL 3X14 BALL (SUTURE) ×2 IMPLANT
SUT VIC AB 1 CTX 36 (SUTURE) ×9
SUT VIC AB 1 CTX36XBRD ANBCTR (SUTURE) ×4 IMPLANT
SYSTEM SAHARA CHEST DRAIN ATS (WOUND CARE) ×3 IMPLANT
TAPE PAPER 2X10 WHT MICROPORE (GAUZE/BANDAGES/DRESSINGS) ×1 IMPLANT
TOWEL GREEN STERILE (TOWEL DISPOSABLE) ×3 IMPLANT
TOWEL GREEN STERILE FF (TOWEL DISPOSABLE) ×3 IMPLANT
TRAY FOLEY SLVR 16FR TEMP STAT (SET/KITS/TRAYS/PACK) ×3 IMPLANT
UNDERPAD 30X36 HEAVY ABSORB (UNDERPADS AND DIAPERS) ×3 IMPLANT
VALVE AORTIC SZ25 INSP/RESIL (Prosthesis & Implant Heart) ×1 IMPLANT
VENT LEFT HEART 12002 (CATHETERS) ×3
WATER STERILE IRR 1000ML POUR (IV SOLUTION) ×6 IMPLANT

## 2021-04-16 NOTE — Hospital Course (Addendum)
?  HPI: ?  ?The patient is a 66 year old gentleman with a history of hypertension, hyperlipidemia, hypothyroidism, prediabetes, anxiety and depression, and bicuspid aortic valve stenosis.  An echocardiogram on 03/09/2021 showed a bicuspid aortic valve with a mean gradient of 40 mmHg and a valve area of 0.64 cm?.  Left ventricular ejection fraction was 60% with a stroke-volume index of 26.  Cardiac catheterization showed no significant coronary disease with mildly elevated left heart filling pressures.  He reports progressive exertional fatigue and shortness of breath as well as some dizziness.  He has had no syncope.  He has had some exertional left-sided chest discomfort radiating into his left arm and up into his neck on both sides.  He denies orthopnea.  He has had mild ankle edema at times. ?  ?The patient lives at home with his wife and is retired.  His wife has metastatic breast cancer with diffuse metastases including brain and is undergoing intensified chemotherapy at [facility].  He provides most of her care at home. ?       ?The patient and all relevant studies were reviewed by Dr. Cyndia Bent who recommended proceeding with aortic valve replacement and the patient was admitted this hospitalization for the procedure.  ? ? ?Hospital course: ? ?Patient was admitted electively and on 04/16/2021 taken the operating room and underwent aortic valve replacement with an 25 mm Inspiris bioprosthetic valve.  The patient tolerated the procedure well was taken to the surgical intensive care unit in stable condition.  Vital signs and hemodynamics remained stable.  Intraoperatively he was started on IV amiodarone.  This was changed to p.o. on postoperative day #1.  He was weaned from the mechanical ventilator per protocol and extubated by 4:30 PM on the day of surgery.  He did not require any inotropic support.  By the morning of the first postoperative day, he was maintaining stable sinus rhythm and had stable blood pressure with no  vasopressor support.  Monitoring lines were removed.  He was mobilized.  Diuresis was begun for 3 kg volume excess.  Glucose was monitored and sliding scale insulin was provided as required.  He has had some mild hypertension and lisinopril has been resumed.  He does have an expected acute blood loss anemia which is stabilized and he has been started on trinsicon.  He has maintained normal renal function.  Incisions are noted to be healing well without evidence of infection.  Oxygen has been weaned and he maintains good saturations on room air.  He is tolerating gradually increasing activities using standard cardiac rehab phase 1 modalities.  Epicardial pacing wires were removed on postoperative day #3 ?

## 2021-04-16 NOTE — Anesthesia Procedure Notes (Signed)
Anesthesia Procedure Image    

## 2021-04-16 NOTE — Plan of Care (Signed)
?  Problem: Clinical Measurements: ?Goal: Respiratory complications will improve ?Outcome: Progressing ?  ?Problem: Coping: ?Goal: Level of anxiety will decrease ?Outcome: Not Progressing ?  ?Problem: Education: ?Goal: Knowledge of General Education information will improve ?Description: Including pain rating scale, medication(s)/side effects and non-pharmacologic comfort measures ?Outcome: Progressing ?  ?Problem: Clinical Measurements: ?Goal: Cardiovascular complication will be avoided ?Outcome: Progressing ?  ?

## 2021-04-16 NOTE — Transfer of Care (Signed)
Immediate Anesthesia Transfer of Care Note ? ?Patient: Randall Flores. ? ?Procedure(s) Performed: AORTIC VALVE REPLACEMENT (AVR) USING EDWARDS INSPIRIS AORTIC VALVE 25MM (Chest) ?TRANSESOPHAGEAL ECHOCARDIOGRAM (TEE) ? ?Patient Location: ICU ? ?Anesthesia Type:General ? ?Level of Consciousness: Patient remains intubated per anesthesia plan ? ?Airway & Oxygen Therapy: Patient remains intubated per anesthesia plan and Patient placed on Ventilator (see vital sign flow sheet for setting) ? ?Post-op Assessment: Report given to RN and Post -op Vital signs reviewed and stable ? ?Post vital signs: Reviewed and stable ? ?Last Vitals:  ?Vitals Value Taken Time  ?BP 114/59 04/16/21 1222  ?Temp    ?Pulse 61 04/16/21 1222  ?Resp 12 04/16/21 1222  ?SpO2 99 % 04/16/21 1222  ? ? ?Last Pain:  ?Vitals:  ? 04/16/21 0617  ?TempSrc:   ?PainSc: 0-No pain  ?   ? ?  ? ?Complications: No notable events documented. ?

## 2021-04-16 NOTE — Op Note (Signed)
CARDIOVASCULAR SURGERY OPERATIVE NOTE ? ?04/16/2021 ?Randall Flores. ?[id number] ? ?Surgeon:  Gaye Pollack, MD ? ?First Assistant: Jadene Pierini,  [facility] ? ? ?Preoperative Diagnosis:  Severe aortic stenosis ? ? ?Postoperative Diagnosis:  Same ? ? ?Procedure: ? ?Median Sternotomy ?Extracorporeal circulation ?3.   Aortic valve replacement using a 25 mm Edwards INSPIRIS RESILIA pericardial valve. ? ?Anesthesia:  General Endotracheal ? ? ?Clinical History/Surgical Indication: ? ?This 66 year old gentleman has stage D, severe, symptomatic bicuspid aortic valve stenosis with [place] Heart Association class II-III symptoms of exertional fatigue and shortness of breath as well as intermittent dizziness and chest discomfort.  His echocardiogram shows a mean gradient of 40 mmHg and a valve area of 0.64 cm? consistent with severe aortic stenosis.  Cardiac catheterization shows no angiographically significant coronary disease. CT of the chest shows ectasia of the ascending aorta with a maximum diameter of 4.1 cm.  I agree that aortic valve replacement is indicated in this patient for relief of his progressive symptoms and to prevent left ventricular dysfunction.  Given his relatively young age of 66 I think that open surgical aortic valve replacement using a bovine pericardial bioprosthesis would be the best treatment for him.  I discussed the alternative of transcatheter aortic valve replacement which I would not recommend given his young age of 66.  The long-term durability is undetermined and I think open surgical valve replacement would give him the best long-term result with the option of valve in valve TAVR in the future if he should have structural valve deterioration. ?  ?I discussed the operative procedure with the patient and family including alternatives, benefits and risks; including but not limited to bleeding, blood transfusion, infection, stroke, myocardial infarction, graft failure, heart block requiring a permanent  pacemaker, organ dysfunction, and death.  Randall Flores. understands and agrees to proceed.  ? ?Preparation: ? ?The patient was seen in the preoperative holding area and the correct patient, correct operation were confirmed with the patient after reviewing the medical record and catheterization. The consent was signed by me. Preoperative antibiotics were given. A pulmonary arterial line and radial arterial line were placed by the anesthesia team. The patient was taken back to the operating room and positioned supine on the operating room table. After being placed under general endotracheal anesthesia by the anesthesia team a foley catheter was placed. The neck, chest, abdomen, and both legs were prepped with betadine soap and solution and draped in the usual sterile manner. A surgical time-out was taken and the correct patient and operative procedure were confirmed with the nursing and anesthesia staff. ? ? ?Pre-bypass TEE: ?  ?Complete TEE assessment was performed by Dr. Myrtie Soman. This showed a functionally bicuspid aortic valve with severe AS, normal LV systolic function. ? ? ? ?Post-bypass TEE: ? ? ?Normal functioning prosthetic aortic valve with no perivalvular leak or regurgitation through the valve. Left ventricular function preserved. No mitral regurgitation. ? ? ? ?Cardiopulmonary Bypass: ? ?A median sternotomy was performed. The pericardium was opened in the midline. Right ventricular function appeared normal. The ascending aorta was of normal size and had no palpable plaque. There were no contraindications to aortic cannulation or cross-clamping. The patient was fully systemically heparinized and the ACT was maintained > 400 sec. The proximal aortic arch was cannulated with a 20 F aortic cannula for arterial inflow. Venous cannulation was performed via the right atrial appendage using a two-staged venous cannula. An antegrade cardioplegia/vent cannula was inserted into the mid-ascending aorta.  A  left ventricular vent was placed via the right superior pulmonary vein. A retrograde cardioplegia cannnula was placed into the coronary sinus via the right atrium. Aortic occlusion was performed with a single cross-clamp. Systemic cooling to 32 degrees Centigrade and topical cooling of the heart with iced saline were used. Antegrade cold KBC cardioplegia was used to induce diastolic arrest. A temperature probe was inserted into the interventricular septum and an insulating pad was placed in the pericardium. Carbon dioxide was insufflated into the pericardium at 5L/min throughout the procedure to minimize intracardiac air. ? ? ?Aortic Valve Replacement:  ? ?A transverse aortotomy was performed 1 cm above the take-off of the right coronary artery. The native valve was a Sievers type 1 bicuspid valve with a single raphe between the left and right cusps. There were severely calcified leaflets and mild annular calcification with some extension down onto the anterior mitral valve leaflet. The ostia of the coronary arteries were in normal position with separate LAD and LCX ostia and were not obstructed. The native valve leaflets were excised and the annulus was decalcified with rongeurs. Care was taken to remove all particulate debris. The left ventricle was directly inspected for debris and then irrigated with ice saline solution. The annulus was sized and a size 25 mm INSPIRIS  valve was chosen. The model number was [id number] and the serial number was [id number]. While the valve was being prepared 2-0 Ethibond pledgeted horizontal mattress sutures were placed around the annulus with the pledgets in a sub-annular position. The sutures were placed through the sewing ring and the valve lowered into place. The sutures were tied using Cor-Knots. The valve seated nicely and the coronary ostia were not obstructed. The prosthetic valve leaflets moved normally and there was no sub-valvular obstruction. The aortotomy was closed using  4-0 Prolene suture in 2 layers with felt strips to reinforce the closure. ? ?Completion: ? ?The patient was rewarmed to 37 degrees Centigrade. De-airing maneuvers were performed and the head placed in trendelenburg position. A reanimation dose of warm retrograde cardioplegia was given. The crossclamp was removed with a time of 78 minutes. There was spontaneous return of sinus rhythm. The aortotomy was checked for hemostasis. Two temporary epicardial pacing wires were placed on the right atrium and two on the right ventricle. The left ventricular vent and retrograde cardioplegia cannulas were removed. The patient was weaned from CPB without difficulty on no inotropes. CPB time was 113 minutes. Cardiac output was 5 LPM. Heparin was fully reversed with protamine and the aortic and venous cannulas removed. Hemostasis was achieved. Mediastinal and right pleural drainage tubes were placed. The sternum was closed with double #6 stainless steel wires. The fascia was closed with continuous # 1 vicryl suture. The subcutaneous tissue was closed with 2-0 vicryl continuous suture. The skin was closed with 3-0 vicryl subcuticular suture. All sponge, needle, and instrument counts were reported correct at the end of the case. Dry sterile dressings were placed over the incisions and around the chest tubes which were connected to pleurevac suction. The patient was then transported to the surgical intensive care unit in stable condition. ? ? ?  ?

## 2021-04-16 NOTE — Progress Notes (Signed)
EVENING ROUNDS NOTE : ? ?   ?[place].[address] ?      [place] [address] ?            [phone]   ?              ?Day of Surgery ?Procedure(s) (LRB): ?AORTIC VALVE REPLACEMENT (AVR) USING EDWARDS INSPIRIS AORTIC VALVE 25MM (N/A) ?TRANSESOPHAGEAL ECHOCARDIOGRAM (TEE) (N/A) ? ? ?Total Length of Stay:  LOS: 0 days  ?Events:   ?Minimal CT output ?Good hemodynamics ?Weaning to extubate ? ? ? ?BP 107/73 (BP Location: Right Arm)   Pulse 68   Temp (!) 96.3 ?F (35.7 ?C) (Core)   Resp 17   Ht '5\' 11"'$  (1.803 m)   Wt 98 kg   SpO2 100%   BMI 30.13 kg/m?  ? ?PAP: (14-30)/(-5-20) 30/20 ?CO:  [3.3 L/min-4.5 L/min] 4.5 L/min ?CI:  [1.5 L/min/m2-2.1 L/min/m2] 2.1 L/min/m2 ? ?Vent Mode: SIMV;PSV;PRVC ?FiO2 (%):  [40 %-50 %] 40 % ?Set Rate:  [4 bmp-12 bmp] 4 bmp ?Vt Set:  [600 mL] 600 mL ?PEEP:  [5 cmH20] 5 cmH20 ?Plateau Pressure:  [20 cmH20] 20 cmH20 ? ? sodium chloride    ? [START ON 04/17/2021] sodium chloride    ? sodium chloride 10 mL/hr at 04/16/21 1322  ? albumin human 100 mL/hr at 04/16/21 1500  ? amiodarone 60 mg/hr (04/16/21 1500)  ? amiodarone    ?  ceFAZolin (ANCEF) IV Stopped (04/16/21 1440)  ? dexmedetomidine (PRECEDEX) IV infusion Stopped (04/16/21 1433)  ? famotidine (PEPCID) IV Stopped (04/16/21 1404)  ? insulin 2.2 Units/hr (04/16/21 1500)  ? lactated ringers    ? lactated ringers    ? lactated ringers 20 mL/hr at 04/16/21 1500  ? magnesium sulfate 20 mL/hr at 04/16/21 1500  ? nitroGLYCERIN 20 mcg/min (04/16/21 1500)  ? phenylephrine (NEO-SYNEPHRINE) Adult infusion 0 mcg/min (04/16/21 1244)  ? vancomycin    ? ? ?No intake/output data recorded. ? ? ? ?  Latest Ref Rng & Units 04/16/2021  ? 12:30 PM 04/16/2021  ? 12:29 PM 04/16/2021  ? 11:12 AM  ?CBC  ?WBC 4.0 - 10.5 K/uL  12.5     ?Hemoglobin 13.0 - 17.0 g/dL 9.5   10.0   9.9    ?Hematocrit 39.0 - 52.0 % 28.0   29.8   29.0    ?Platelets 150 - 400 K/uL  151     ? ? ? ?  Latest Ref Rng & Units 04/16/2021  ? 12:30 PM 04/16/2021  ? 11:12 AM 04/16/2021  ?  11:09 AM  ?BMP  ?Glucose 70 - 99 mg/dL  177     ?BUN 8 - 23 mg/dL  16     ?Creatinine 0.61 - 1.24 mg/dL  0.90     ?Sodium 135 - 145 mmol/L 135   135   135    ?Potassium 3.5 - 5.1 mmol/L 4.2   3.8   3.9    ?Chloride 98 - 111 mmol/L  103     ? ? ?ABG ?   ?Component Value Date/Time  ? PHART 7.325 (L) 04/16/2021 1230  ? PCO2ART 40.6 04/16/2021 1230  ? PO2ART 116 (H) 04/16/2021 1230  ? HCO3 21.4 04/16/2021 1230  ? TCO2 23 04/16/2021 1230  ? ACIDBASEDEF 5.0 (H) 04/16/2021 1230  ? O2SAT 98 04/16/2021 1230  ? ? ? ? ? ?Melodie Bouillon, MD ?04/16/2021 4:10 PM ? ? ?

## 2021-04-16 NOTE — Progress Notes (Signed)
?  Echocardiogram ?Echocardiogram Transesophageal has been performed. ? ?Randall Flores ?04/16/2021, 8:34 AM ?

## 2021-04-16 NOTE — Brief Op Note (Signed)
04/16/2021 ? ?4:11 PM ? ?PATIENT:  Randall Flores.  66 y.o. male ? ?PRE-OPERATIVE DIAGNOSIS:  SEVERE AS ? ?POST-OPERATIVE DIAGNOSIS:  SEVERE AS ? ?PROCEDURE:  Procedure(s): ?AORTIC VALVE REPLACEMENT (AVR) USING EDWARDS INSPIRIS AORTIC VALVE 25MM (N/A) ?TRANSESOPHAGEAL ECHOCARDIOGRAM (TEE) (N/A) ? ?SURGEON:  Surgeon(s) and Role: ?   * Bartle, Fernande Boyden, MD - Primary ? ?PHYSICIAN ASSISTANT: Jamari Moten PA-C ? ?ASSISTANTS: STAFF  ? ?ANESTHESIA:   general ? ?EBL:  850 mL  ? ?BLOOD ADMINISTERED:none ? ?DRAINS:  MEDIASTINAL AND RIGHT CHEST TUBE   ? ?LOCAL MEDICATIONS USED:  NONE ? ?SPECIMEN:  Source of Specimen:  AORTIC VALVE LEAFLETS ? ?DISPOSITION OF SPECIMEN:  PATHOLOGY ? ?COUNTS:  YES ? ?TOURNIQUET:  * No tourniquets in log * ? ?DICTATION: .Dragon Dictation ? ?PLAN OF CARE: Admit to inpatient  ? ?PATIENT DISPOSITION:  ICU - intubated and hemodynamically stable. ?  ?Delay start of Pharmacological VTE agent (>24hrs) due to surgical blood loss or risk of bleeding: yes ? ?COMPLICATIONS: NO KNOWN ?

## 2021-04-16 NOTE — Anesthesia Preprocedure Evaluation (Signed)
Anesthesia Evaluation  ?Patient identified by MRN, date of birth, ID band ?Patient awake ? ? ? ?Reviewed: ?Allergy & Precautions, NPO status , Patient's Chart, lab work & pertinent test results ? ?Airway ?Mallampati: II ? ?TM Distance: >3 FB ?Neck ROM: Full ? ? ? Dental ?no notable dental hx. ? ?  ?Pulmonary ?neg pulmonary ROS,  ?  ?Pulmonary exam normal ?breath sounds clear to auscultation ? ? ? ? ? ? Cardiovascular ?hypertension, + Valvular Problems/Murmurs AS  ?Rhythm:Regular Rate:Normal ?+ Systolic murmurs ?Left ventricular ejection fraction, by estimation, is 60 to 65%. The  ?left ventricle has normal function. The left ventricle has no regional  ?wall motion abnormalities. There is mild left ventricular hypertrophy.  ?Left ventricular diastolic parameters  ?are indeterminate.  ??2. Right ventricular systolic function is normal. The right ventricular  ?size is normal. Tricuspid regurgitation signal is inadequate for assessing  ?PA pressure.  ??3. Left atrial size was mildly dilated.  ??4. The mitral valve is normal in structure. No evidence of mitral valve  ?regurgitation. No evidence of mitral stenosis.  ??5. The aortic valve is bicuspid. Aortic valve regurgitation is not  ?visualized. Severe aortic valve stenosis. Aortic valve area, by VTI  ?measures 0.64 cm?Marland Kitchen Aortic valve mean gradient measures 39.7 mmHg.  ??6. The inferior vena cava is normal in size with greater than 50%  ?respiratory variability, suggesting right atrial pressure of 3 mmHg ?  ?Neuro/Psych ?negative neurological ROS ? negative psych ROS  ? GI/Hepatic ?negative GI ROS, Neg liver ROS,   ?Endo/Other  ?Hypothyroidism  ? Renal/GU ?negative Renal ROS  ?negative genitourinary ?  ?Musculoskeletal ?negative musculoskeletal ROS ?(+)  ? Abdominal ?  ?Peds ?negative pediatric ROS ?(+)  Hematology ?negative hematology ROS ?(+)   ?Anesthesia Other Findings ? ? Reproductive/Obstetrics ?negative OB ROS ? ?   ? ? ? ? ? ? ? ? ? ? ? ? ? ?  ?  ? ? ? ? ? ? ? ? ?Anesthesia Physical ?Anesthesia Plan ? ?ASA: 3 ? ?Anesthesia Plan: General  ? ?Post-op Pain Management:   ? ?Induction: Intravenous ? ?PONV Risk Score and Plan: 2 and Ondansetron, Dexamethasone and Treatment may vary due to age or medical condition ? ?Airway Management Planned: Oral ETT ? ?Additional Equipment: Arterial line, CVP, PA Cath, TEE and Ultrasound Guidance Line Placement ? ?Intra-op Plan:  ? ?Post-operative Plan: Post-operative intubation/ventilation ? ?Informed Consent: I have reviewed the patients History and Physical, chart, labs and discussed the procedure including the risks, benefits and alternatives for the proposed anesthesia with the patient or authorized representative who has indicated his/her understanding and acceptance.  ? ? ? ?Dental advisory given ? ?Plan Discussed with: CRNA and Surgeon ? ?Anesthesia Plan Comments:   ? ? ? ? ? ? ?Anesthesia Quick Evaluation ? ?

## 2021-04-16 NOTE — Interval H&P Note (Signed)
History and Physical Interval Note: ? ?04/16/2021 ?7:13 AM ? ?[name]  has presented today for surgery, with the diagnosis of SEVERE AS.  The various methods of treatment have been discussed with the patient and family. After consideration of risks, benefits and other options for treatment, the patient has consented to  Procedure(s): ?AORTIC VALVE REPLACEMENT (AVR) (N/A) ?TRANSESOPHAGEAL ECHOCARDIOGRAM (TEE) (N/A) as a surgical intervention.  The patient's history has been reviewed, patient examined, no change in status, stable for surgery.  I have reviewed the patient's chart and labs.  Questions were answered to the patient's satisfaction.   ? ? ?Gaye Pollack ? ? ?

## 2021-04-16 NOTE — Anesthesia Procedure Notes (Signed)
Central Venous Catheter Insertion ?Performed by: Myrtie Soman, MD, anesthesiologist ?Start/End3/23/2023 6:35 AM, 04/16/2021 6:55 AM ?Patient location: Pre-op. ?Preanesthetic checklist: patient identified, IV checked, site marked, risks and benefits discussed, surgical consent, monitors and equipment checked, pre-op evaluation, timeout performed and anesthesia consent ?Position: Trendelenburg ?Lidocaine 1% used for infiltration and patient sedated ?Hand hygiene performed  and maximum sterile barriers used  ?Catheter size: 8.5 Fr ?PA cath was placed.Sheath introducer ?PA Cath depth:42 ?Procedure performed using ultrasound guided technique. ?Ultrasound Notes:anatomy identified, needle tip was noted to be adjacent to the nerve/plexus identified, no ultrasound evidence of intravascular and/or intraneural injection and image(s) printed for medical record ?Attempts: 1 ?Following insertion, line sutured, dressing applied and Biopatch. ?Post procedure assessment: blood return through all ports, free fluid flow and no air ? ?Patient tolerated the procedure well with no immediate complications. ? ? ? ?

## 2021-04-16 NOTE — TOC Initial Note (Signed)
Transition of Care (TOC) - Initial/Assessment Note  ? ? ?Patient Details  ?Name: Randall Flores. ?MRN: [id number] ?Date of Birth: [birth year] ? ?Transition of Care (TOC) CM/SW Contact:    ?Verdell Carmine, RN ?Phone Number: ?04/16/2021, 12:45 PM ? ?Clinical Narrative:                 ? ? ? The Transition of Care Department (TOC) has reviewed patient and no TOC needs have been identified at this time. We will continue to monitor patient advancement through interdisciplinary progression rounds. If new patient transition needs arise, please place a TOC consult ?  ? ? ?Patient Goals and CMS Choice ?  ?  ?  ? ?Expected Discharge Plan and Services ?  ?  ?  ?  ?  ?                ?  ?  ?  ?  ?  ?  ?  ?  ?  ?  ? ?Prior Living Arrangements/Services ?  ?  ?  ?       ?  ?  ?  ?  ? ?Activities of Daily Living ?  ?  ? ?Permission Sought/Granted ?  ?  ?   ?   ?   ?   ? ?Emotional Assessment ?  ?  ?  ?  ?  ?  ? ?Admission diagnosis:  S/P aortic valve replacement with bioprosthetic valve [Z95.3] ?Patient Active Problem List  ? Diagnosis Date Noted  ? S/P aortic valve replacement with bioprosthetic valve 04/16/2021  ? Severe aortic stenosis 03/10/2021  ? Primary localized osteoarthritis of right knee 04/07/2018  ? Colon cancer screening   ? 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 Abdominal pain, right upper quadrant 02/06/2015  ? Right lower quadrant abdominal pain 02/06/2015  ? ?PCP:  Frazier Richards, MD ?Pharmacy:   ?[facility] [address] ?[place] [address] ?Phone: [phone] Fax: [phone] ? ? ? ? ?Social Determinants of Health (SDOH) Interventions ?  ? ?Readmission Risk Interventions ?   ? View : No data to display.  ?  ?  ?  ? ? ? ?

## 2021-04-16 NOTE — Anesthesia Procedure Notes (Signed)
Arterial Line Insertion ?Start/End3/23/2023 6:40 AM, 04/16/2021 6:55 AM ?Performed by: Myrtie Soman, MD, CRNA ? Patient location: Pre-op. ?Preanesthetic checklist: patient identified, IV checked, site marked, risks and benefits discussed, surgical consent, monitors and equipment checked, pre-op evaluation and anesthesia consent ?Lidocaine 1% used for infiltration and patient sedated ?Left, radial was placed ?Catheter size: 20 G ?Hand hygiene performed , maximum sterile barriers used  and Seldinger technique used ?Allen's test indicative of satisfactory collateral circulation ?Attempts: 2 ?Procedure performed without using ultrasound guided technique. ?Following insertion, dressing applied and Biopatch. ?Post procedure assessment: normal ? ?Patient tolerated the procedure well with no immediate complications. ? ? ?

## 2021-04-16 NOTE — Anesthesia Postprocedure Evaluation (Signed)
Anesthesia Post Note ? ?Patient: Randall Flores. ? ?Procedure(s) Performed: AORTIC VALVE REPLACEMENT (AVR) USING EDWARDS INSPIRIS AORTIC VALVE 25MM (Chest) ?TRANSESOPHAGEAL ECHOCARDIOGRAM (TEE) ? ?  ? ?Patient location during evaluation: SICU ?Anesthesia Type: General ?Level of consciousness: sedated ?Pain management: pain level controlled ?Vital Signs Assessment: post-procedure vital signs reviewed and stable ?Respiratory status: patient remains intubated per anesthesia plan ?Cardiovascular status: stable ?Postop Assessment: no apparent nausea or vomiting ?Anesthetic complications: no ? ? ?No notable events documented. ? ?Last Vitals:  ?Vitals:  ? 04/16/21 1500 04/16/21 1600  ?BP:    ?Pulse: 68 68  ?Resp: 12 17  ?Temp: (!) 35.9 ?C (!) 35.7 ?C  ?SpO2: 100% 100%  ?  ?Last Pain:  ?Vitals:  ? 04/16/21 1600  ?TempSrc: Core  ?PainSc:   ? ? ?  ?  ?  ?  ?  ?  ? ?Simuel Stebner S ? ? ? ? ?

## 2021-04-16 NOTE — Brief Op Note (Incomplete)
04/16/2021 ? ?12:09 PM ? ?PATIENT:  Randall Flores.  66 y.o. male ? ?PRE-OPERATIVE DIAGNOSIS:  SEVERE AS ? ?POST-OPERATIVE DIAGNOSIS:  SEVERE AS ? ?PROCEDURE:  Procedure(s): ?AORTIC VALVE REPLACEMENT (AVR) USING EDWARDS INSPIRIS AORTIC VALVE 25MM (N/A) ?TRANSESOPHAGEAL ECHOCARDIOGRAM (TEE) (N/A) ? ?SURGEON:  Surgeon(s) and Role: ?   * Bartle, Fernande Boyden, MD - Primary ? ?PHYSICIAN ASSISTANT:  ? ?ASSISTANTS: {ASSISTANTS:31801}  ? ?ANESTHESIA:   {procedures; anesthesia:812} ? ?EBL:  850 mL  ? ?BLOOD ADMINISTERED:{BLOOD GIVEN TYPES AND AMOUNTS:20467} ? ?DRAINS: {Devices; drains:31758}  ? ?LOCAL MEDICATIONS USED:  {LOCAL MEDICATIONS:10721995} ? ?SPECIMEN:  {ONC STAGING; AJCC TYPE OF SPECIMEN:115600001} ? ?DISPOSITION OF SPECIMEN:  {SPECIMEN DISPOSITION:204680} ? ?COUNTS:  {OR COUNTS CORRECT/INCORRECT:204690} ? ?TOURNIQUET:  * No tourniquets in log * ? ?DICTATION: .{DICTATION BHALP:3790240973} ? ?PLAN OF CARE: {OPTIME PLAN OF ZHGD:9242683419} ? ?PATIENT DISPOSITION:  {op note disposition:31782} ?  ?Delay start of Pharmacological VTE agent (>24hrs) due to surgical blood loss or risk of bleeding: {YES/NO/NOT APPLICABLE:20182} ? ?

## 2021-04-16 NOTE — Procedures (Addendum)
Extubation Procedure Note ? ?Patient Details:   ?Name: Randall Flores. ?DOB: 1955-04-28 ?MRN: [id number] ?  ?Airway Documentation:  ?  ?Vent end date: 04/16/21 Vent end time: 1630  ? ?Evaluation ? O2 sats: stable throughout ?Complications: No apparent complications ?Patient did tolerate procedure well. ?Bilateral Breath Sounds: Clear, Diminished ?  ?Yes ? ?Pt was extubated per rapid wean protocol. NIF -20, VC 1.2L, and cuff leak was noted all prior to extubation. No stridor heard post. Pt is stable at this time. RT will monitor.  ? ?Ronaldo Miyamoto ?04/16/2021, 4:34 PM ? ?

## 2021-04-16 NOTE — Anesthesia Procedure Notes (Addendum)
Procedure Name: Intubation ?Date/Time: 04/16/2021 7:51 AM ?Performed by: Janace Litten, CRNA ?Pre-anesthesia Checklist: Patient identified, Emergency Drugs available, Suction available and Patient being monitored ?Patient Re-evaluated:Patient Re-evaluated prior to induction ?Oxygen Delivery Method: Circle System Utilized ?Preoxygenation: Pre-oxygenation with 100% oxygen ?Induction Type: IV induction ?Ventilation: Two handed mask ventilation required ?Laryngoscope Size: Mac and 3 ?Grade View: Grade II ?Tube type: Oral ?Tube size: 8.0 mm ?Number of attempts: 2 ?Airway Equipment and Method: Stylet ?Placement Confirmation: ETT inserted through vocal cords under direct vision, positive ETCO2 and breath sounds checked- equal and bilateral ?Secured at: 23 cm ?Tube secured with: Tape ?Dental Injury: Teeth and Oropharynx as per pre-operative assessment  ? ? ? ? ?

## 2021-04-17 ENCOUNTER — Encounter (HOSPITAL_COMMUNITY): Payer: Self-pay | Admitting: Surgery

## 2021-04-17 ENCOUNTER — Inpatient Hospital Stay (HOSPITAL_COMMUNITY): Payer: Medicare HMO

## 2021-04-17 LAB — GLUCOSE, CAPILLARY
Glucose-Capillary: 111 mg/dL — ABNORMAL HIGH (ref 70–99)
Glucose-Capillary: 112 mg/dL — ABNORMAL HIGH (ref 70–99)
Glucose-Capillary: 113 mg/dL — ABNORMAL HIGH (ref 70–99)
Glucose-Capillary: 121 mg/dL — ABNORMAL HIGH (ref 70–99)
Glucose-Capillary: 121 mg/dL — ABNORMAL HIGH (ref 70–99)
Glucose-Capillary: 123 mg/dL — ABNORMAL HIGH (ref 70–99)
Glucose-Capillary: 126 mg/dL — ABNORMAL HIGH (ref 70–99)
Glucose-Capillary: 127 mg/dL — ABNORMAL HIGH (ref 70–99)
Glucose-Capillary: 127 mg/dL — ABNORMAL HIGH (ref 70–99)
Glucose-Capillary: 129 mg/dL — ABNORMAL HIGH (ref 70–99)
Glucose-Capillary: 133 mg/dL — ABNORMAL HIGH (ref 70–99)
Glucose-Capillary: 98 mg/dL (ref 70–99)
Glucose-Capillary: 99 mg/dL (ref 70–99)

## 2021-04-17 LAB — CBC
HCT: 28.9 % — ABNORMAL LOW (ref 39.0–52.0)
HCT: 29.2 % — ABNORMAL LOW (ref 39.0–52.0)
Hemoglobin: 10.2 g/dL — ABNORMAL LOW (ref 13.0–17.0)
Hemoglobin: 9.6 g/dL — ABNORMAL LOW (ref 13.0–17.0)
MCH: 30.9 pg (ref 26.0–34.0)
MCH: 29.7 pg (ref 26.0–34.0)
MCHC: 35.3 g/dL (ref 30.0–36.0)
MCHC: 32.9 g/dL (ref 30.0–36.0)
MCV: 87.6 fL (ref 80.0–100.0)
MCV: 90.4 fL (ref 80.0–100.0)
Platelets: 165 10*3/uL (ref 150–400)
Platelets: 159 10*3/uL (ref 150–400)
RBC: 3.3 MIL/uL — ABNORMAL LOW (ref 4.22–5.81)
RBC: 3.23 MIL/uL — ABNORMAL LOW (ref 4.22–5.81)
RDW: 12.5 % (ref 11.5–15.5)
RDW: 12.7 % (ref 11.5–15.5)
WBC: 15.1 10*3/uL — ABNORMAL HIGH (ref 4.0–10.5)
WBC: 15.3 10*3/uL — ABNORMAL HIGH (ref 4.0–10.5)
nRBC: 0 % (ref 0.0–0.2)
nRBC: 0 % (ref 0.0–0.2)

## 2021-04-17 LAB — BASIC METABOLIC PANEL
Anion gap: 3 — ABNORMAL LOW (ref 5–15)
Anion gap: 6 (ref 5–15)
BUN: 14 mg/dL (ref 8–23)
BUN: 13 mg/dL (ref 8–23)
CO2: 25 mmol/L (ref 22–32)
CO2: 25 mmol/L (ref 22–32)
Calcium: 8.1 mg/dL — ABNORMAL LOW (ref 8.9–10.3)
Calcium: 8.2 mg/dL — ABNORMAL LOW (ref 8.9–10.3)
Chloride: 107 mmol/L (ref 98–111)
Chloride: 103 mmol/L (ref 98–111)
Creatinine, Ser: 0.85 mg/dL (ref 0.61–1.24)
Creatinine, Ser: 0.95 mg/dL (ref 0.61–1.24)
GFR, Estimated: >60 mL/min (ref >60)
GFR, Estimated: >60 mL/min (ref >60)
Glucose, Bld: 108 mg/dL — ABNORMAL HIGH (ref 70–99)
Glucose, Bld: 119 mg/dL — ABNORMAL HIGH (ref 70–99)
Potassium: 4.4 mmol/L (ref 3.5–5.1)
Potassium: 4.1 mmol/L (ref 3.5–5.1)
Sodium: 135 mmol/L (ref 135–145)
Sodium: 134 mmol/L — ABNORMAL LOW (ref 135–145)

## 2021-04-17 LAB — MAGNESIUM
Magnesium: 2.5 mg/dL — ABNORMAL HIGH (ref 1.7–2.4)
Magnesium: 2.2 mg/dL (ref 1.7–2.4)

## 2021-04-17 LAB — SURGICAL PATHOLOGY

## 2021-04-17 MED ORDER — POTASSIUM CHLORIDE CRYS ER 20 MEQ PO TBCR
20.0000 meq | EXTENDED_RELEASE_TABLET | Freq: Two times a day (BID) | ORAL | Status: AC
  Administered 2021-04-17 (×2): 20 meq via ORAL
  Filled 2021-04-17 (×2): qty 1

## 2021-04-17 MED ORDER — FUROSEMIDE 10 MG/ML IJ SOLN
40.0000 mg | Freq: Two times a day (BID) | INTRAMUSCULAR | Status: AC
Start: 2021-04-17 — End: 2021-04-17
  Administered 2021-04-17 (×2): 40 mg via INTRAVENOUS
  Filled 2021-04-17 (×2): qty 4

## 2021-04-17 MED ORDER — AMIODARONE HCL 200 MG PO TABS
200.0000 mg | ORAL_TABLET | Freq: Every day | ORAL | Status: DC
  Administered 2021-04-17 – 2021-04-20 (×4): 200 mg via ORAL
  Filled 2021-04-17 (×4): qty 1

## 2021-04-17 MED ORDER — METOPROLOL TARTRATE 25 MG/10 ML ORAL SUSPENSION: 25.0000 mg | Freq: Two times a day (BID) | ORAL | Status: DC

## 2021-04-17 MED ORDER — INSULIN ASPART 100 UNIT/ML IJ SOLN
0.0000 [IU] | INTRAMUSCULAR | Status: DC
  Administered 2021-04-17 – 2021-04-18 (×4): 2 [IU] via SUBCUTANEOUS

## 2021-04-17 MED ORDER — METOPROLOL TARTRATE 25 MG PO TABS
25.0000 mg | ORAL_TABLET | Freq: Two times a day (BID) | ORAL | Status: DC
  Administered 2021-04-17 – 2021-04-20 (×7): 25 mg via ORAL
  Filled 2021-04-17 (×7): qty 1

## 2021-04-17 MED ORDER — LISINOPRIL 10 MG PO TABS
10.0000 mg | ORAL_TABLET | Freq: Every day | ORAL | Status: DC
Start: 2021-04-17 — End: 2021-04-19
  Administered 2021-04-17 – 2021-04-18 (×2): 10 mg via ORAL
  Filled 2021-04-17 (×2): qty 1

## 2021-04-17 MED ORDER — INSULIN DETEMIR 100 UNIT/ML ~~LOC~~ SOLN
15.0000 [IU] | Freq: Every day | SUBCUTANEOUS | Status: DC
  Administered 2021-04-17 – 2021-04-18 (×2): 15 [IU] via SUBCUTANEOUS
  Filled 2021-04-17 (×4): qty 0.15

## 2021-04-17 MED ORDER — ENOXAPARIN SODIUM 40 MG/0.4ML IJ SOSY
40.0000 mg | PREFILLED_SYRINGE | Freq: Every day | INTRAMUSCULAR | Status: DC
  Administered 2021-04-17 – 2021-04-19 (×3): 40 mg via SUBCUTANEOUS
  Filled 2021-04-17 (×3): qty 0.4

## 2021-04-17 MED FILL — Thrombin (Recombinant) For Soln 20000 Unit: CUTANEOUS | Qty: 1 | Status: AC

## 2021-04-17 NOTE — Progress Notes (Signed)
CT surgery PM rounds ? ?Doing well maintaining sinus rhythm without ectopy ?Ambulating in hallway ?Stable day ?Blood pressure 134/77, pulse 80, temperature 98 ?F (36.7 ?C), temperature source Oral, resp. rate 13, height '5\' 11"'$  (1.803 m), weight 100.9 kg, SpO2 96 %.  ?

## 2021-04-17 NOTE — Progress Notes (Signed)
1 Day Post-Op Procedure(s) (LRB): ?AORTIC VALVE REPLACEMENT (AVR) USING EDWARDS INSPIRIS AORTIC VALVE 25MM (N/A) ?TRANSESOPHAGEAL ECHOCARDIOGRAM (TEE) (N/A) ?Subjective: ?No complaints ? ?Objective: ?Vital signs in last 24 hours: ?Temp:  [96.3 ?F (35.7 ?C)-98.8 ?F (37.1 ?C)] 98.6 ?F (37 ?C) (03/24 0700) ?Pulse Rate:  [58-86] 79 (03/24 0700) ?Cardiac Rhythm: Normal sinus rhythm (03/24 0600) ?Resp:  [10-27] 17 (03/24 0700) ?BP: (105-137)/(55-78) 127/64 (03/24 0700) ?SpO2:  [94 %-100 %] 94 % (03/24 0700) ?Arterial Line BP: (110-155)/(53-73) 150/62 (03/24 0700) ?FiO2 (%):  [40 %-50 %] 40 % (03/23 1612) ?Weight:  [100.9 kg] 100.9 kg (03/24 0500) ? ?Hemodynamic parameters for last 24 hours: ?PAP: (19-48)/(11-27) 43/17 ?CO:  [3.3 L/min-6.4 L/min] 5.4 L/min ?CI:  [1.5 L/min/m2-2.9 L/min/m2] 2.5 L/min/m2 ? ?Intake/Output from previous day: ?03/23 0701 - 03/24 0700 ?In: 5561.8 [P.O.:165; I.V.:3226; Blood:494; IV Piggyback:1676.8] ?Out: 3570 [Urine:2230; Blood:850; Chest Tube:410] ?Intake/Output this shift: ?No intake/output data recorded. ? ?General appearance: alert and cooperative ?Neurologic: intact ?Heart: regular rate and rhythm, S1, S2 normal, no murmur ?Lungs: clear to auscultation bilaterally ?Extremities: edema mild ?Wound: dressing dr ? ?Lab Results: ?Recent Labs  ?  04/16/21 ?1730 04/17/21 ?0412  ?WBC 16.2* 15.1*  ?HGB 10.9*  10.5* 10.2*  ?HCT 31.4*  31.0* 28.9*  ?PLT 176 165  ? ?BMET:  ?Recent Labs  ?  04/16/21 ?1730 04/17/21 ?0412  ?NA 136  136 135  ?K 4.0  4.0 4.4  ?CL 107 107  ?CO2 20* 25  ?GLUCOSE 174* 108*  ?BUN 17 14  ?CREATININE 1.13 0.85  ?CALCIUM 8.2* 8.1*  ?  ?PT/INR:  ?Recent Labs  ?  04/16/21 ?1229  ?LABPROT 16.8*  ?INR 1.4*  ? ?ABG ?   ?Component Value Date/Time  ? PHART 7.333 (L) 04/16/2021 1730  ? HCO3 21.2 04/16/2021 1730  ? TCO2 22 04/16/2021 1730  ? ACIDBASEDEF 5.0 (H) 04/16/2021 1730  ? O2SAT 97 04/16/2021 1730  ? ?CBG (last 3)  ?Recent Labs  ?  04/17/21 ?0210 04/17/21 ?1779 04/17/21 ?3903   ?GLUCAP 126* 113* 121*  ? ?CXR: clear ? ?ECG: sinus, no acute changes. ? ?Assessment/Plan: ?S/P Procedure(s) (LRB): ?AORTIC VALVE REPLACEMENT (AVR) USING EDWARDS INSPIRIS AORTIC VALVE 25MM (N/A) ?TRANSESOPHAGEAL ECHOCARDIOGRAM (TEE) (N/A) ? ?POD 1 ?Hemodynamically stable in sinus rhythm. Will stop amio drip and start 200 mg daily po.  ? ?DC all chest tubes, swan, arterial line. ? ?Increase lopressor to 25 bid and resume lisinopril 10 mg daily. ? ?Volume excess: wt is 6 lbs over preop. Diurese today. ? ?IS, OOB, mobilize. ? ?Preop Hgb A1c 5.9 on no meds. Will give a small dose of Levemir today and start SSI, stop insulin drip. ? ? LOS: 1 day  ? ? ?Gaye Pollack ?04/17/2021 ? ? ?

## 2021-04-17 NOTE — Discharge Instructions (Signed)

## 2021-04-17 NOTE — Discharge Summary (Signed)
Physician Discharge Summary  ?Patient ID: ?Randall Flores. ?MRN: [id number] ?DOB/AGE: 09/25/1955 66 y.o. ? ?Admit date: 04/16/2021 ?Discharge date: 04/20/2021 ? ?Admission Diagnoses: ? ?Stage D symptomatic aortic valve stenosis ?Bicuspid aortic valve ? ? ?Discharge Diagnoses:  ? ?Stage D symptomatic aortic valve stenosis ?Bicuspid aortic valve ?S/P aortic valve replacement with bioprosthetic valve ? ? ?Discharged Condition: stable ? ?History of Present Illness: ?  ?The patient is a 66 year old gentleman with a history of hypertension, hyperlipidemia, hypothyroidism, prediabetes, anxiety and depression, and bicuspid aortic valve stenosis.  An echocardiogram on 03/09/2021 showed a bicuspid aortic valve with a mean gradient of 40 mmHg and a valve area of 0.64 cm?.  Left ventricular ejection fraction was 60% with a stroke-volume index of 26.  Cardiac catheterization showed no significant coronary disease with mildly elevated left heart filling pressures.  He reports progressive exertional fatigue and shortness of breath as well as some dizziness.  He has had no syncope.  He has had some exertional left-sided chest discomfort radiating into his left arm and up into his neck on both sides.  He denies orthopnea.  He has had mild ankle edema at times. ?  ?The patient lives at home with his wife and is retired.  His wife has metastatic breast cancer with diffuse metastases including brain and is undergoing intensified chemotherapy at [facility].  He provides most of her care at home. ?       ?The patient and all relevant studies were reviewed by Dr. Cyndia Bent who recommended proceeding with aortic valve replacement and the patient was admitted this hospitalization for the procedure.  ? ? ?Hospital Course: ? ?Patient was admitted electively and on 04/16/2021 taken the operating room and underwent aortic valve replacement with an 25 mm Inspiris bioprosthetic valve.  The patient tolerated the procedure well was taken to the surgical  intensive care unit in stable condition.  Vital signs and hemodynamics remained stable.  Intraoperatively he was started on IV amiodarone.  This was changed to p.o. on postoperative day #1.  He was weaned from the mechanical ventilator per protocol and extubated by 4:30 PM on the day of surgery.  He did not require any inotropic support.  By the morning of the first postoperative day, he was maintaining stable sinus rhythm and had stable blood pressure with no vasopressor support.  Monitoring lines were removed.  He was mobilized.  Diuresis was begun for 3 kg volume excess.  Glucose was monitored and sliding scale insulin was provided as required.  He has had some mild hypertension and lisinopril has been resumed.  He does have an expected acute blood loss anemia which is stabilized.  He has maintained normal renal function.  Incisions are noted to be healing well without evidence of infection.  Oxygen has been weaned and he maintains good saturations on room air.  He is tolerating gradually increasing activities using standard cardiac rehab phase 1 modalities.  Epicardial pacing wires were removed on postoperative day #3. By the 4th post-op day, he was independent with ambulation and maintaining stable SR. O2 sats were adequate on RA.  ? ?Consults: None ? ?Significant Diagnostic Studies:  ? ?CLINICAL DATA:  Status post aortic valve replacement. ?  ?EXAM: ?CHEST - 2 VIEW ?  ?COMPARISON:  Chest radiograph dated 04/18/2021. ?  ?FINDINGS: ?The heart size and mediastinal contours are within normal limits. ?Left basilar atelectasis has decreased and is mild. Trace pleural ?fluid is likely present bilaterally. There is no pneumothorax. ?Median sternotomy wires  and an aortic valve replacement are ?redemonstrated. ?  ?IMPRESSION: ?Decreased left basilar atelectasis. Likely trace pleural effusions ?bilaterally. ?  ?  ?Electronically Signed ?  By: Zerita Boers M.D. ?  On: 04/19/2021 12:55 ? ?Treatments:  Surgery ? ?CARDIOVASCULAR SURGERY OPERATIVE NOTE ?  ?04/16/2021 ?Randall Flores. ?[id number] ?  ?Surgeon:  Gaye Pollack, MD ?  ?First Assistant: Jadene Pierini,  [facility] ?  ?  ?Preoperative Diagnosis:  Severe aortic stenosis ?  ?  ?Postoperative Diagnosis:  Same ?  ?  ?Procedure: ?  ?Median Sternotomy ?Extracorporeal circulation ?3.   Aortic valve replacement using a 25 mm Edwards INSPIRIS RESILIA pericardial valve. ?  ?Anesthesia:  General Endotracheal ?  ?  ?Clinical History/Surgical Indication: ?  ?This 66 year old gentleman has stage D, severe, symptomatic bicuspid aortic valve stenosis with New York Heart Association class II-III symptoms of exertional fatigue and shortness of breath as well as intermittent dizziness and chest discomfort.  His echocardiogram shows a mean gradient of 40 mmHg and a valve area of 0.64 cm? consistent with severe aortic stenosis.  Cardiac catheterization shows no angiographically significant coronary disease. CT of the chest shows ectasia of the ascending aorta with a maximum diameter of 4.1 cm.  I agree that aortic valve replacement is indicated in this patient for relief of his progressive symptoms and to prevent left ventricular dysfunction.  Given his relatively young age of 66 I think that open surgical aortic valve replacement using a bovine pericardial bioprosthesis would be the best treatment for him.  I discussed the alternative of transcatheter aortic valve replacement which I would not recommend given his young age.  The long-term durability is undetermined and I think open surgical valve replacement would give him the best long-term result with the option of valve in valve TAVR in the future if he should have structural valve deterioration. ?  ?I discussed the operative procedure with the patient and family including alternatives, benefits and risks; including but not limited to bleeding, blood transfusion, infection, stroke, myocardial infarction, graft failure, heart  block requiring a permanent pacemaker, organ dysfunction, and death.  Randall Flores. understands and agrees to proceed.  ? ?Discharge Exam: ?Blood pressure 135/80, pulse 80, temperature 98 ?F (36.7 ?C), temperature source Oral, resp. rate 16, height '5\' 11"'$  (1.803 m), weight 97.4 kg, SpO2 97 %. ? ?General appearance: alert and cooperative ?Neurologic: intact ?Heart: regular rate and rhythm, S1, S2 normal, no murmur ?Lungs: clear to auscultation bilaterally ?Abdomen: soft, non-tender; bowel sounds normal ?Extremities: extremities normal, no edema ?Wound: incision healing well. ? ?Disposition: Discharged to home in stable condition ? ?Discharge Instructions   ? ? AMB Referral to Cardiac Rehabilitation - Phase II   Complete by: As directed ?  ? Diagnosis: Valve Replacement  ? Valve: Aortic  ? After initial evaluation and assessments completed: Virtual Based Care may be provided alone or in conjunction with Phase 2 Cardiac Rehab based on patient barriers.: Yes  ? ?  ? ?Allergies as of 04/20/2021   ? ?   Reactions  ? Codeine Nausea And Vomiting  ? Ivp Dye [iodinated Contrast Media] Nausea And Vomiting  ? Also felt very hot/flushed  ? Pravastatin   ? Other reaction(s): Muscle Pain  ? Shellfish Allergy Nausea And Vomiting  ? ?  ? ?  ?Medication List  ?  ? ?STOP taking these medications   ? ?docusate sodium 100 MG capsule ?Commonly known as: COLACE ?  ? ?  ? ?TAKE  these medications   ? ?acetaminophen 500 MG tablet ?Commonly known as: TYLENOL ?Take 1,000 mg by mouth every 6 (six) hours as needed for moderate pain. ?  ?amiodarone 200 MG tablet ?Commonly known as: PACERONE ?Take 1 tablet (200 mg total) by mouth daily. ?  ?aspirin 325 MG EC tablet ?Take 1 tablet (325 mg total) by mouth daily. ?What changed:  ?medication strength ?how much to take ?additional instructions ?  ?cholecalciferol 25 MCG (1000 UNIT) tablet ?Commonly known as: VITAMIN D3 ?Take 1,000 Units by mouth daily. ?  ?citalopram 20 MG tablet ?Commonly known  as: CELEXA ?Take 20 mg by mouth daily. ?  ?DULoxetine 60 MG capsule ?Commonly known as: CYMBALTA ?Take 60 mg by mouth daily. ?  ?furosemide 20 MG tablet ?Commonly known as: Lasix ?Take 1 tablet (20 mg total) by mouth daily. ?  ?hyd

## 2021-04-17 NOTE — Progress Notes (Incomplete)
STUDENT FOLLOW-UP NOTES - DISREGARD ? ? ? ? ? ? ? ? ? ? ? ? ? ? ? ? ? ? ? ? ? ? ?CC:  ? ?HPI:  ? ?Allergies  ?Allergen Reactions  ? Codeine Nausea And Vomiting  ? Ivp Dye [Iodinated Contrast Media] Nausea And Vomiting  ?  Also felt very hot/flushed  ? Pravastatin   ?  Other reaction(s): Muscle Pain  ? Shellfish Allergy Nausea And Vomiting  ?  ?Past Medical History:  ?Diagnosis Date  ? Anxiety   ? BPH with obstruction/lower urinary tract symptoms   ? Colon polyp   ? Multiple  ? Complication of anesthesia   ? wakes up during anesthesia  ? DDD (degenerative disc disease), lumbar   ? Diverticulitis   ? Diverticulosis   ? Elevated PSA   ? Fatty liver 01/22/2015  ? Mild, noted on [place] Abd  ? Hepatic cyst 06/08/2016  ? Left, noted on CT renal  ? Hepatic hemangioma 02/03/2015  ? Small, noted on MRI Abd  ? History of colitis   ? History of kidney stones   ? Hypercholesteremia   ? Hypertension   ? Hypothyroidism   ? Major depression   ? Moderate aortic stenosis 05/24/2017  ? Noted on ECHO  ? Near syncope 04/25/2017  ? due to stress  ? OA (osteoarthritis)   ? knees, back, hands  ? Pre-diabetes   ? PVC (premature ventricular contraction)   ? Stroke [facility]) 2015  ? TIA/right side affected, right side has improved  ? Wears dentures   ? partial upper, front  ?  ?Family History  ?Problem Relation Age of Onset  ? Alzheimer's disease Mother   ?  ?Social History  ? ?Socioeconomic History  ? Marital status: Married  ?  Spouse name: Gabrian Hoque  ? Number of children: 2  ? Years of education: Not on file  ? Highest education level: Not on file  ?Occupational History  ? Not on file  ?Tobacco Use  ? Smoking status: Never  ? Smokeless tobacco: Never  ?Vaping Use  ? Vaping Use: Never used  ?Substance and Sexual Activity  ? Alcohol use: Not Currently  ?  Comment: 6 beers/week  ? Drug use: Yes  ?  Types: Marijuana  ?  Comment: had beg. of Feb. 2023  ? Sexual activity: Not on file  ?Other Topics Concern  ? Not on file  ?Social History Narrative   ? Lives at home with wife   ? ?Social Determinants of Health  ? ?Financial Resource Strain: Not on file  ?Food Insecurity: Not on file  ?Transportation Needs: Not on file  ?Physical Activity: Not on file  ?Stress: Not on file  ?Social Connections: Not on file  ?  ?Today's Vitals  ? 04/17/21 0518 04/17/21 0600 04/17/21 0633 04/17/21 0700  ?BP:  137/75  127/64  ?Pulse:  81  79  ?Resp:  (!) 21  17  ?Temp:  98.4 ?F (36.9 ?C)  98.6 ?F (37 ?C)  ?TempSrc:  Core  Core  ?SpO2:  94%  94%  ?Weight:      ?Height:      ?PainSc: '4  8  3    '$ ? ?Body mass index is 31.02 kg/m?.  ? ?Labs:  ?Imaging: ?Medications: ? ? ?CC: aortic stenosis ?HPI: 60 YOM with PMH of HTN, HLD, Hypothyroidism, Prediabetes, and bicuspid aortic valve stenosis presented for an elective aortic valve replacement procedure on 3/23, now s/p. ? ? ? ? ?  A/P ? ?AS s/p bioprosthetic valve replacement ?No indications for AC, bioprosthetic TAVI, HASBLED 0 ?Wt 98kg pre-op, current 100.9kg; I/O + 2L ?GDMT: lisinopril, crestor, metoprolol, amiodarone ?-f/u AC plans, VKA? ?-diurese? ? ?[email or web address] ? ? ?HTN ?SBPs 120-130s, SCr 0.85, K 4.4, Mg 2.5  ?GDMT: lisinopril ?-off pressors, nitro ? ? ?HLD ?LDL 105 (2015),  ?GDMT: Crestor 10 PTA ?-unable to calculate ASCVD risk ? ? ??citalopram: Qtc prolongation ??cymbalta: SNRI vs SSRI? ? ? ?Anticoag: Hgb 10.2, Plts 165 ? ?ID: WBC 12.5 >> 16.2 >> 15.1; afeb ? ?Antimicrobials this admission:  ?*** *** >> *** ?*** *** >> *** ? ?Microbiology results: ?*** BCx: *** ?*** UCx: ***  ?*** Sputum: ***  ?*** Resp. Panel: *** ?*** MRSA PCR: *** ? ?CV: EF 60-65%, AV area 0.64cm2; AV gradient 39.7 mmHg ? ?Endocrine: A1c 5.9, off insulin gtt, on SSI; Glu < 180 ? ?GI/Nutrition:  ? ?Neuro:  ? ?Renal:  ? ?Pulm:  ? ?Heme/Onc:  ? ?PTA Med Issues:  ? ?Additional Notes:  ? ?

## 2021-04-18 ENCOUNTER — Inpatient Hospital Stay (HOSPITAL_COMMUNITY): Payer: Medicare HMO

## 2021-04-18 LAB — BASIC METABOLIC PANEL
Anion gap: 4 — ABNORMAL LOW (ref 5–15)
BUN: 14 mg/dL (ref 8–23)
CO2: 28 mmol/L (ref 22–32)
Calcium: 8.4 mg/dL — ABNORMAL LOW (ref 8.9–10.3)
Chloride: 102 mmol/L (ref 98–111)
Creatinine, Ser: 0.98 mg/dL (ref 0.61–1.24)
GFR, Estimated: >60 mL/min (ref >60)
Glucose, Bld: 139 mg/dL — ABNORMAL HIGH (ref 70–99)
Potassium: 4.4 mmol/L (ref 3.5–5.1)
Sodium: 134 mmol/L — ABNORMAL LOW (ref 135–145)

## 2021-04-18 LAB — GLUCOSE, CAPILLARY
Glucose-Capillary: 155 mg/dL — ABNORMAL HIGH (ref 70–99)
Glucose-Capillary: 148 mg/dL — ABNORMAL HIGH (ref 70–99)
Glucose-Capillary: 111 mg/dL — ABNORMAL HIGH (ref 70–99)
Glucose-Capillary: 144 mg/dL — ABNORMAL HIGH (ref 70–99)

## 2021-04-18 LAB — CBC
HCT: 27.8 % — ABNORMAL LOW (ref 39.0–52.0)
Hemoglobin: 9.2 g/dL — ABNORMAL LOW (ref 13.0–17.0)
MCH: 29.8 pg (ref 26.0–34.0)
MCHC: 33.1 g/dL (ref 30.0–36.0)
MCV: 90 fL (ref 80.0–100.0)
Platelets: 148 10*3/uL — ABNORMAL LOW (ref 150–400)
RBC: 3.09 MIL/uL — ABNORMAL LOW (ref 4.22–5.81)
RDW: 12.9 % (ref 11.5–15.5)
WBC: 13.1 10*3/uL — ABNORMAL HIGH (ref 4.0–10.5)
nRBC: 0 % (ref 0.0–0.2)

## 2021-04-18 MED ORDER — FUROSEMIDE 40 MG PO TABS
40.0000 mg | ORAL_TABLET | Freq: Every day | ORAL | Status: DC
  Administered 2021-04-18 – 2021-04-19 (×2): 40 mg via ORAL
  Filled 2021-04-18 (×2): qty 1

## 2021-04-18 MED ORDER — POTASSIUM CHLORIDE 20 MEQ PO PACK
20.0000 meq | PACK | Freq: Every day | ORAL | Status: DC
  Administered 2021-04-19: 20 meq via ORAL
  Filled 2021-04-18: qty 1

## 2021-04-18 NOTE — Progress Notes (Signed)
2 Days Post-Op Procedure(s) (LRB): ?AORTIC VALVE REPLACEMENT (AVR) USING EDWARDS INSPIRIS AORTIC VALVE 25MM (N/A) ?TRANSESOPHAGEAL ECHOCARDIOGRAM (TEE) (N/A) ?Subjective: ?Patient did well overnight and walked early a.m. ?Maintaining sinus rhythm without ectopy ?Chest x-ray clear ?We will transfer to progressive care unit today ? ?Objective: ?Vital signs in last 24 hours: ?Temp:  [97.8 ?F (36.6 ?C)-98.8 ?F (37.1 ?C)] 98.3 ?F (36.8 ?C) (03/25 1120) ?Pulse Rate:  [65-84] 73 (03/25 1223) ?Cardiac Rhythm: Normal sinus rhythm (03/25 1200) ?Resp:  [0-24] 19 (03/25 1223) ?BP: (108-148)/(67-83) 143/79 (03/25 1223) ?SpO2:  [93 %-99 %] 96 % (03/25 1223) ?Weight:  [98.8 kg] 98.8 kg (03/25 0500) ? ?Hemodynamic parameters for last 24 hours: ?  ? ?Intake/Output from previous day: ?03/24 0701 - 03/25 0700 ?In: 1233.3 [P.O.:460; I.V.:507.4; IV Piggyback:266] ?Out: 4193 [Urine:3435; Chest Tube:60] ?Intake/Output this shift: ?Total I/O ?In: 482.9 [P.O.:360; I.V.:97.5; IV Piggyback:25.4] ?Out: 200 [Urine:200] ? ?  ?   Exam ? ?  General- alert and comfortable ?   Neck- no JVD, no cervical adenopathy palpable, no carotid bruit ?  Lungs- clear without rales, wheezes ?  Cor- regular rate and rhythm, no murmur , gallop ?  Abdomen- soft, non-tender ?  Extremities - warm, non-tender, minimal edema ?  Neuro- oriented, appropriate, no focal weakness ? ? ?Lab Results: ?Recent Labs  ?  04/17/21 ?1604 04/18/21 ?7902  ?WBC 15.3* 13.1*  ?HGB 9.6* 9.2*  ?HCT 29.2* 27.8*  ?PLT 159 148*  ? ?BMET:  ?Recent Labs  ?  04/17/21 ?1604 04/18/21 ?4097  ?NA 134* 134*  ?K 4.1 4.4  ?CL 103 102  ?CO2 25 28  ?GLUCOSE 119* 139*  ?BUN 13 14  ?CREATININE 0.95 0.98  ?CALCIUM 8.2* 8.4*  ?  ?PT/INR:  ?Recent Labs  ?  04/16/21 ?1229  ?LABPROT 16.8*  ?INR 1.4*  ? ?ABG ?   ?Component Value Date/Time  ? PHART 7.333 (L) 04/16/2021 1730  ? HCO3 21.2 04/16/2021 1730  ? TCO2 22 04/16/2021 1730  ? ACIDBASEDEF 5.0 (H) 04/16/2021 1730  ? O2SAT 97 04/16/2021 1730  ? ?CBG (last 3)   ?Recent Labs  ?  04/18/21 ?0343 04/18/21 ?0724 04/18/21 ?1116  ?GLUCAP 155* 148* 111*  ? ? ?Assessment/Plan: ?S/P Procedure(s) (LRB): ?AORTIC VALVE REPLACEMENT (AVR) USING EDWARDS INSPIRIS AORTIC VALVE 25MM (N/A) ?TRANSESOPHAGEAL ECHOCARDIOGRAM (TEE) (N/A) ?Mobilize ?Diuresis ?Plan for transfer to step-down: see transfer orders ? ? LOS: 2 days  ? ? ?Dahlia Byes ?04/18/2021 ?  ?

## 2021-04-19 ENCOUNTER — Inpatient Hospital Stay (HOSPITAL_COMMUNITY): Payer: Medicare HMO

## 2021-04-19 LAB — CBC
HCT: 26.4 % — ABNORMAL LOW (ref 39.0–52.0)
Hemoglobin: 8.8 g/dL — ABNORMAL LOW (ref 13.0–17.0)
MCH: 29.9 pg (ref 26.0–34.0)
MCHC: 33.3 g/dL (ref 30.0–36.0)
MCV: 89.8 fL (ref 80.0–100.0)
Platelets: 159 10*3/uL (ref 150–400)
RBC: 2.94 MIL/uL — ABNORMAL LOW (ref 4.22–5.81)
RDW: 12.6 % (ref 11.5–15.5)
WBC: 9.4 10*3/uL (ref 4.0–10.5)
nRBC: 0 % (ref 0.0–0.2)

## 2021-04-19 LAB — BASIC METABOLIC PANEL
Anion gap: 6 (ref 5–15)
BUN: 13 mg/dL (ref 8–23)
CO2: 27 mmol/L (ref 22–32)
Calcium: 8.3 mg/dL — ABNORMAL LOW (ref 8.9–10.3)
Chloride: 101 mmol/L (ref 98–111)
Creatinine, Ser: 0.87 mg/dL (ref 0.61–1.24)
GFR, Estimated: >60 mL/min (ref >60)
Glucose, Bld: 87 mg/dL (ref 70–99)
Potassium: 3.9 mmol/L (ref 3.5–5.1)
Sodium: 134 mmol/L — ABNORMAL LOW (ref 135–145)

## 2021-04-19 LAB — GLUCOSE, CAPILLARY: Glucose-Capillary: 91 mg/dL (ref 70–99)

## 2021-04-19 MED ORDER — FE FUMARATE-B12-VIT C-FA-IFC PO CAPS
1.0000 | ORAL_CAPSULE | Freq: Two times a day (BID) | ORAL | Status: DC
  Administered 2021-04-19 – 2021-04-20 (×3): 1 via ORAL
  Filled 2021-04-19 (×3): qty 1

## 2021-04-19 MED ORDER — LISINOPRIL 10 MG PO TABS
10.0000 mg | ORAL_TABLET | Freq: Every day | ORAL | Status: DC
  Administered 2021-04-19 – 2021-04-20 (×2): 10 mg via ORAL
  Filled 2021-04-19 (×2): qty 1

## 2021-04-19 MED ORDER — LISINOPRIL 20 MG PO TABS: 20.0000 mg | ORAL_TABLET | Freq: Every day | ORAL | Status: DC

## 2021-04-19 MED ORDER — HYDROXYZINE HCL 25 MG PO TABS
25.0000 mg | ORAL_TABLET | Freq: Three times a day (TID) | ORAL | Status: DC | PRN
  Administered 2021-04-19: 25 mg via ORAL
  Filled 2021-04-19: qty 1

## 2021-04-19 NOTE — Progress Notes (Signed)
EPW removed as per orders. Tolerated well, Bedrest until 10:40a.m. ?

## 2021-04-19 NOTE — Progress Notes (Addendum)
? ?   ?[address] ?      [place] [address] ?            [phone]   ? ?  3 Days Post-Op Procedure(s) (LRB): ?AORTIC VALVE REPLACEMENT (AVR) USING EDWARDS INSPIRIS AORTIC VALVE 25MM (N/A) ?TRANSESOPHAGEAL ECHOCARDIOGRAM (TEE) (N/A) ?Subjective: ?Minor pain ? ?Objective: ?Vital signs in last 24 hours: ?Temp:  [98 ?F (36.7 ?C)-98.5 ?F (36.9 ?C)] 98.3 ?F (36.8 ?C) (03/26 0408) ?Pulse Rate:  [65-81] 72 (03/26 0408) ?Cardiac Rhythm: Normal sinus rhythm (03/25 1900) ?Resp:  [4-24] 18 (03/26 0408) ?BP: (112-145)/(69-79) 117/77 (03/26 0408) ?SpO2:  [94 %-98 %] 95 % (03/26 0408) ?Weight:  [98.7 kg] 98.7 kg (03/26 0408) ? ?Hemodynamic parameters for last 24 hours: ?  ? ?Intake/Output from previous day: ?03/25 0701 - 03/26 0700 ?In: 650.6 [P.O.:480; I.V.:145.2; IV Piggyback:25.4] ?Out: 1350 [[id number]] ?Intake/Output this shift: ?No intake/output data recorded. ? ?General appearance: alert, cooperative, and no distress ?Heart: regular rate and rhythm and no murmur ?Lungs: clear to auscultation bilaterally ?Abdomen: benign ?Extremities: no edema ?Wound: dressing CDI ? ?Lab Results: ?Recent Labs  ?  04/18/21 ?7035 04/19/21 ?0121  ?WBC 13.1* 9.4  ?HGB 9.2* 8.8*  ?HCT 27.8* 26.4*  ?PLT 148* 159  ? ?BMET:  ?Recent Labs  ?  04/18/21 ?0093 04/19/21 ?0121  ?NA 134* 134*  ?K 4.4 3.9  ?CL 102 101  ?CO2 28 27  ?GLUCOSE 139* 87  ?BUN 14 13  ?CREATININE 0.98 0.87  ?CALCIUM 8.4* 8.3*  ?  ?PT/INR:  ?Recent Labs  ?  04/16/21 ?1229  ?LABPROT 16.8*  ?INR 1.4*  ? ?ABG ?   ?Component Value Date/Time  ? PHART 7.333 (L) 04/16/2021 1730  ? HCO3 21.2 04/16/2021 1730  ? TCO2 22 04/16/2021 1730  ? ACIDBASEDEF 5.0 (H) 04/16/2021 1730  ? O2SAT 97 04/16/2021 1730  ? ?CBG (last 3)  ?Recent Labs  ?  04/18/21 ?1116 04/18/21 ?2129 04/19/21 ?0006  ?GLUCAP 111* 144* 91  ? ? ?Meds ?Scheduled Meds: ? acetaminophen  1,000 mg Oral Q6H  ? Or  ? acetaminophen (TYLENOL) oral liquid 160 mg/5 mL  1,000 mg Per Tube Q6H  ? amiodarone  200 mg Oral  Daily  ? aspirin EC  325 mg Oral Daily  ? Or  ? aspirin  324 mg Per Tube Daily  ? bisacodyl  10 mg Oral Daily  ? Or  ? bisacodyl  10 mg Rectal Daily  ? Chlorhexidine Gluconate Cloth  6 each Topical Daily  ? citalopram  20 mg Oral Daily  ? docusate sodium  200 mg Oral Daily  ? DULoxetine  60 mg Oral Daily  ? enoxaparin (LOVENOX) injection  40 mg Subcutaneous QHS  ? furosemide  40 mg Oral Daily  ? insulin aspart  0-24 Units Subcutaneous Q4H  ? insulin detemir  15 Units Subcutaneous Daily  ? levothyroxine  150 mcg Oral QAC breakfast  ? lisinopril  10 mg Oral Daily  ? metoprolol tartrate  25 mg Oral BID  ? Or  ? metoprolol tartrate  25 mg Per Tube BID  ? pantoprazole  40 mg Oral Daily  ? potassium chloride  20 mEq Oral Daily  ? rosuvastatin  10 mg Oral Daily  ? sodium chloride flush  10-40 mL Intracatheter Q12H  ? sodium chloride flush  3 mL Intravenous Q12H  ? tamsulosin  0.4 mg Oral Daily  ? ?Continuous Infusions: ? sodium chloride Stopped (04/17/21 0736)  ? sodium chloride    ?  sodium chloride 10 mL/hr at 04/16/21 1322  ? lactated ringers    ? lactated ringers 20 mL/hr at 04/18/21 1200  ? ?PRN Meds:.sodium chloride, metoprolol tartrate, ondansetron (ZOFRAN) IV, oxyCODONE, sodium chloride flush, sodium chloride flush, traMADol ? ?Xrays ?DG Chest Port 1 View ? ?Result Date: 04/18/2021 ?CLINICAL DATA:  Follow-up.  Status post aortic valve replacement. EXAM: PORTABLE CHEST 1 VIEW COMPARISON:  04/17/2021 and older exams. FINDINGS: Since the previous day's study, all lines and tubes have been removed with the exception of the right internal jugular introducer sheath. No pneumothorax. Linear perihilar and left greater than right lung base opacities are similar to the prior exam, consistent with atelectasis. Suspect small effusions. Remainder of the lungs is clear. No mediastinal widening. IMPRESSION: 1. No acute findings or evidence of an operative complication. 2. Mild persistent perihilar and lung base opacities consistent  with atelectasis. Small effusions suspected. Electronically Signed   By: Lajean Manes M.D.   On: 04/18/2021 08:44   ? ?Assessment/Plan: ?S/P Procedure(s) (LRB): ?AORTIC VALVE REPLACEMENT (AVR) USING EDWARDS INSPIRIS AORTIC VALVE 25MM (N/A) ?TRANSESOPHAGEAL ECHOCARDIOGRAM (TEE) (N/A) ?POD#3  ? ?1 afebrile, VSS S BP 110's-140's, follow, may need to increase lisinopril, sinus rhythm, rare PVC ?2 sats good on RA ?3 good UOP, weight at preop, normal renal fxn- stop lasix soon ?4 expected ABLA, fairly stable, not at transfusion threshold - will add trinsicon ?5 BS adeq controlled, HG A1C 5.9 will stop insulin ?6 CXR minor atx, no effusions ?7 cont pulm hygiene and cardiac rehab ?8 poss home in am ? ? ? ? LOS: 3 days  ? ? ?John Giovanni PA-C ?Pager [phone] ?04/19/2021 ?  ?Cardiac rhythm stable, pacing wires removed ?Chest x-ray today is clear ?Sternal incision clean and dry ?Titrating blood pressure medicines in preparation for discharge tomorrow ? ?patient examined and medical record reviewed,agree with above note. ?Dahlia Byes ?04/19/2021 ? ?

## 2021-04-20 ENCOUNTER — Other Ambulatory Visit: Payer: Self-pay | Admitting: Cardiology

## 2021-04-20 DIAGNOSIS — I350 Nonrheumatic aortic (valve) stenosis: Secondary | ICD-10-CM

## 2021-04-20 MED ORDER — ASPIRIN 325 MG PO TBEC: 325.0000 mg | DELAYED_RELEASE_TABLET | Freq: Every day | ORAL | 99 refills | Status: DC

## 2021-04-20 MED ORDER — METOPROLOL TARTRATE 25 MG PO TABS: 25.0000 mg | ORAL_TABLET | Freq: Two times a day (BID) | ORAL | 2 refills | Status: DC

## 2021-04-20 MED ORDER — TRAMADOL HCL 50 MG PO TABS: 50.0000 mg | ORAL_TABLET | Freq: Four times a day (QID) | ORAL | 0 refills | Status: AC | PRN

## 2021-04-20 MED ORDER — AMIODARONE HCL 200 MG PO TABS: 200.0000 mg | ORAL_TABLET | Freq: Every day | ORAL | 1 refills | Status: DC

## 2021-04-20 MED FILL — Heparin Sodium (Porcine) Inj 1000 Unit/ML: Qty: 1000 | Status: AC

## 2021-04-20 MED FILL — Potassium Chloride Inj 2 mEq/ML: INTRAVENOUS | Qty: 20 | Status: AC

## 2021-04-20 MED FILL — Lidocaine HCl Local Preservative Free (PF) Inj 2%: INTRAMUSCULAR | Qty: 15 | Status: AC

## 2021-04-20 NOTE — Care Management Important Message (Signed)
Important Message ? ?Patient Details  ?Name: Randall Flores. ?MRN: [id number] ?Date of Birth: 27-Nov-1955 ? ? ?Medicare Important Message Given:  Yes ? ?Patient left prior to IM delivery will mail to the patient home address.  ? ? ?Dmitri Pettigrew ?04/20/2021, 3:44 PM ?

## 2021-04-20 NOTE — Progress Notes (Signed)
CARDIAC REHAB PHASE I  ? ?Offered to walk with pt. Pt states independent ambulation without difficulty. D/c education completed with pt and sister. Pt educated on importance of site care and monitoring incision daily. Encouraged continued IS use, walks, and sternal precautions. Pt given in-the-tube sheet along with heart healthy and diabetic diets. Reviewed restrictions and exercise guidelines. Will refer to CRP II . ? ?[phone] ?Rufina Falco, RN BSN ?04/20/2021 ?9:36 AM ? ?

## 2021-04-20 NOTE — Progress Notes (Signed)
4 Days Post-Op Procedure(s) (LRB): ?AORTIC VALVE REPLACEMENT (AVR) USING EDWARDS INSPIRIS AORTIC VALVE 25MM (N/A) ?TRANSESOPHAGEAL ECHOCARDIOGRAM (TEE) (N/A) ?Subjective: ?No complaints. Walking well. Passing flatus but no BM yet. Wants to go home. ? ?Objective: ?Vital signs in last 24 hours: ?Temp:  [98 ?F (36.7 ?C)-98.9 ?F (37.2 ?C)] 98.9 ?F (37.2 ?C) (03/27 0554) ?Pulse Rate:  [72-78] 77 (03/27 0554) ?Cardiac Rhythm: Normal sinus rhythm (03/26 1900) ?Resp:  [12-25] 18 (03/27 0554) ?BP: (113-148)/(61-97) 148/79 (03/27 0554) ?SpO2:  [94 %-100 %] 94 % (03/27 0554) ?Weight:  [97.4 kg] 97.4 kg (03/27 0548) ? ?Hemodynamic parameters for last 24 hours: ?  ? ?Intake/Output from previous day: ?03/26 0701 - 03/27 0700 ?In: 240 [P.O.:240] ?Out: 2800 [Urine:2800] ?Intake/Output this shift: ?No intake/output data recorded. ? ?General appearance: alert and cooperative ?Neurologic: intact ?Heart: regular rate and rhythm, S1, S2 normal, no murmur ?Lungs: clear to auscultation bilaterally ?Abdomen: soft, non-tender; bowel sounds normal ?Extremities: extremities normal, no edema ?Wound: incision healing well. ? ?Lab Results: ?Recent Labs  ?  04/18/21 ?5809 04/19/21 ?0121  ?WBC 13.1* 9.4  ?HGB 9.2* 8.8*  ?HCT 27.8* 26.4*  ?PLT 148* 159  ? ?BMET:  ?Recent Labs  ?  04/18/21 ?9833 04/19/21 ?0121  ?NA 134* 134*  ?K 4.4 3.9  ?CL 102 101  ?CO2 28 27  ?GLUCOSE 139* 87  ?BUN 14 13  ?CREATININE 0.98 0.87  ?CALCIUM 8.4* 8.3*  ?  ?PT/INR: No results for input(s): LABPROT, INR in the last 72 hours. ?ABG ?   ?Component Value Date/Time  ? PHART 7.333 (L) 04/16/2021 1730  ? HCO3 21.2 04/16/2021 1730  ? TCO2 22 04/16/2021 1730  ? ACIDBASEDEF 5.0 (H) 04/16/2021 1730  ? O2SAT 97 04/16/2021 1730  ? ?CBG (last 3)  ?Recent Labs  ?  04/18/21 ?1116 04/18/21 ?2129 04/19/21 ?0006  ?GLUCAP 111* 144* 91  ? ? ?Assessment/Plan: ?S/P Procedure(s) (LRB): ?AORTIC VALVE REPLACEMENT (AVR) USING EDWARDS INSPIRIS AORTIC VALVE 25MM (N/A) ?TRANSESOPHAGEAL  ECHOCARDIOGRAM (TEE) (N/A) ? ?POD 4 ?Hemodynamically stable in sinus rhythm. Will continue Lopressor. Continue amiodarone 200 mg daily until I see him back in the office. ? ?Wt is back to baseline. DC lasix. ? ?I think he can go home today. ? ? LOS: 4 days  ? ? ?Gaye Pollack ?04/20/2021 ? ? ?

## 2021-04-22 ENCOUNTER — Emergency Department (HOSPITAL_COMMUNITY)
Admission: EM | Admit: 2021-04-22 | Discharge: 2021-04-22 | Disposition: A | Discharge status: Home / Self Care | Payer: Medicare HMO | Source: Home / Self Care | Attending: Emergency Medicine | Admitting: Emergency Medicine

## 2021-04-22 ENCOUNTER — Emergency Department (HOSPITAL_COMMUNITY): Payer: Medicare HMO

## 2021-04-22 ENCOUNTER — Other Ambulatory Visit: Payer: Self-pay

## 2021-04-22 ENCOUNTER — Encounter (HOSPITAL_COMMUNITY): Payer: Self-pay

## 2021-04-22 DIAGNOSIS — I951 Orthostatic hypotension: Secondary | ICD-10-CM | POA: Diagnosis not present

## 2021-04-22 DIAGNOSIS — R569 Unspecified convulsions: Secondary | ICD-10-CM | POA: Insufficient documentation

## 2021-04-22 DIAGNOSIS — Z7901 Long term (current) use of anticoagulants: Secondary | ICD-10-CM | POA: Insufficient documentation

## 2021-04-22 DIAGNOSIS — I4892 Unspecified atrial flutter: Secondary | ICD-10-CM | POA: Insufficient documentation

## 2021-04-22 DIAGNOSIS — Z7982 Long term (current) use of aspirin: Secondary | ICD-10-CM | POA: Insufficient documentation

## 2021-04-22 DIAGNOSIS — Z79899 Other long term (current) drug therapy: Secondary | ICD-10-CM | POA: Insufficient documentation

## 2021-04-22 DIAGNOSIS — R937 Abnormal findings on diagnostic imaging of other parts of musculoskeletal system: Secondary | ICD-10-CM | POA: Insufficient documentation

## 2021-04-22 LAB — CBC
HCT: 30.3 % — ABNORMAL LOW (ref 39.0–52.0)
Hemoglobin: 10.3 g/dL — ABNORMAL LOW (ref 13.0–17.0)
MCH: 30.1 pg (ref 26.0–34.0)
MCHC: 34 g/dL (ref 30.0–36.0)
MCV: 88.6 fL (ref 80.0–100.0)
Platelets: 276 10*3/uL (ref 150–400)
RBC: 3.42 MIL/uL — ABNORMAL LOW (ref 4.22–5.81)
RDW: 12.4 % (ref 11.5–15.5)
WBC: 8 10*3/uL (ref 4.0–10.5)
nRBC: 0 % (ref 0.0–0.2)

## 2021-04-22 LAB — BASIC METABOLIC PANEL
Anion gap: 11 (ref 5–15)
BUN: 12 mg/dL (ref 8–23)
CO2: 22 mmol/L (ref 22–32)
Calcium: 8.7 mg/dL — ABNORMAL LOW (ref 8.9–10.3)
Chloride: 99 mmol/L (ref 98–111)
Creatinine, Ser: 1.05 mg/dL (ref 0.61–1.24)
GFR, Estimated: >60 mL/min (ref >60)
Glucose, Bld: 153 mg/dL — ABNORMAL HIGH (ref 70–99)
Potassium: 4.1 mmol/L (ref 3.5–5.1)
Sodium: 132 mmol/L — ABNORMAL LOW (ref 135–145)

## 2021-04-22 LAB — URINALYSIS, ROUTINE W REFLEX MICROSCOPIC
Bilirubin Urine: NEGATIVE
Glucose, UA: NEGATIVE mg/dL
Hgb urine dipstick: NEGATIVE
Ketones, ur: NEGATIVE mg/dL
Leukocytes,Ua: NEGATIVE
Nitrite: NEGATIVE
Protein, ur: NEGATIVE mg/dL
Specific Gravity, Urine: <1.005 — ABNORMAL LOW (ref 1.005–1.030)
pH: 7 (ref 5.0–8.0)

## 2021-04-22 LAB — CBG MONITORING, ED: Glucose-Capillary: 148 mg/dL — ABNORMAL HIGH (ref 70–99)

## 2021-04-22 MED ORDER — LEVETIRACETAM 500 MG PO TABS: 500.0000 mg | ORAL_TABLET | Freq: Two times a day (BID) | ORAL | 0 refills | Status: DC

## 2021-04-22 MED ORDER — LEVETIRACETAM IN NACL 1500 MG/100ML IV SOLN
1500.0000 mg | Freq: Once | INTRAVENOUS | Status: AC
  Administered 2021-04-22: 1500 mg via INTRAVENOUS
  Filled 2021-04-22: qty 100

## 2021-04-22 MED ORDER — APIXABAN 5 MG PO TABS: 5.0000 mg | ORAL_TABLET | Freq: Two times a day (BID) | ORAL | 0 refills | Status: DC

## 2021-04-22 MED ORDER — APIXABAN 5 MG PO TABS
5.0000 mg | ORAL_TABLET | Freq: Once | ORAL | Status: AC
  Administered 2021-04-22: 5 mg via ORAL
  Filled 2021-04-22: qty 1

## 2021-04-22 MED ORDER — GADOBUTROL 1 MMOL/ML IV SOLN
10.0000 mL | Freq: Once | INTRAVENOUS | Status: AC | PRN
  Administered 2021-04-22: 10 mL via INTRAVENOUS

## 2021-04-22 NOTE — ED Triage Notes (Signed)
Pt here via ems from home for a seizure. Pt reports increased anxiety which he thinks may have lead to a panic attack and then to the seizure with no known history. Family reports seizure lasted approx 30 seconds. Aortic valve replacement 1 week ago. Endorses shob and weakness. Oxygen saturations 90% RA. ?

## 2021-04-22 NOTE — ED Notes (Signed)
RN reviewed discharge instructions with pt. Pt verbalized understanding and had no further questions. VSS upon discharge.  

## 2021-04-22 NOTE — ED Provider Notes (Signed)
?[place] ?Provider Note ? ? ?CSN: [id number] ?Arrival date & time: 04/22/21  1438 ? ?  ? ?History ? ?Chief Complaint  ?Patient presents with  ? Seizures  ? ? ?Randall Flores. is a 66 y.o. male. ? ?Patient presents with a new seizure-like episode.  He was in the bath and when he called out for his wife.  When she arrived she saw him having what she describes as a spasming of his bilateral arms unresponsiveness foaming at the mouth and loss of bladder control.  Symptoms last anywhere from 2 to 3 minutes and then resolved.  Afterwards he was confused appearing for several minutes before returning back to baseline.  Otherwise no reports of headache or chest pain or abdominal pain.  No reports of fevers or cough or vomiting or diarrhea.  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 ? ? ?  ? ?Home Medications ?Prior to Admission medications   ?Medication Sig Start Date End Date Taking? Authorizing Provider  ?acetaminophen (TYLENOL) 500 MG tablet Take 1,000 mg by mouth every 6 (six) hours as needed for moderate pain.    [provider]  ?amiodarone (PACERONE) 200 MG tablet Take 1 tablet (200 mg total) by mouth daily. 04/20/21   Antony Odea, PA-C  ?aspirin EC 325 MG EC tablet Take 1 tablet (325 mg total) by mouth daily. 04/20/21   Antony Odea, PA-C  ?cholecalciferol (VITAMIN D3) 25 MCG (1000 UNIT) tablet Take 1,000 Units by mouth daily.    [provider]  ?citalopram (CELEXA) 20 MG tablet Take 20 mg by mouth daily.    [provider]  ?DULoxetine (CYMBALTA) 60 MG capsule Take 60 mg by mouth daily.    [provider]  ?furosemide (LASIX) 20 MG tablet Take 1 tablet (20 mg total) by mouth daily. 03/10/21 03/10/22  End, Harrell Gave, MD  ?hydrOXYzine (ATARAX/VISTARIL) 25 MG tablet Take 1 tablet  (25 mg total) by mouth 3 (three) times daily as needed for anxiety. 08/25/20   Harvest Dark, MD  ?levothyroxine (SYNTHROID, LEVOTHROID) 150 MCG tablet Take 150 mcg by mouth daily before breakfast.     [provider]  ?lisinopril (PRINIVIL,ZESTRIL) 10 MG tablet Take 10 mg by mouth daily.  07/30/14   [provider]  ?metoprolol tartrate (LOPRESSOR) 25 MG tablet Take 1 tablet (25 mg total) by mouth 2 (two) times daily. 04/20/21   Antony Odea, PA-C  ?Multiple Vitamin (MULTIVITAMIN) capsule Take 1 capsule by mouth daily.    [provider]  ?rosuvastatin (CRESTOR) 10 MG tablet Take 10 mg by mouth daily.    [provider]  ?tamsulosin (FLOMAX) 0.4 MG CAPS capsule Take 1 capsule (0.4 mg total) by mouth daily. 09/30/20   Hollice Espy, MD  ?traMADol (ULTRAM) 50 MG tablet Take 1 tablet (50 mg total) by mouth every 6 (six) hours as needed for up to 7 days for moderate pain. 04/20/21 04/27/21  Antony Odea, PA-C  ?   ? ?Allergies    ?Codeine, Ivp dye [iodinated contrast media], Pravastatin, and Shellfish allergy   ? ?Review of Systems   ?Review of Systems  ?Constitutional:  Negative for fever.  ?HENT:  Negative for ear pain and sore throat.   ?Eyes:  Negative for pain.  ?Respiratory:  Negative for cough.   ?Cardiovascular:  Negative for chest pain.  ?  Gastrointestinal:  Negative for abdominal pain.  ?Genitourinary:  Negative for flank pain.  ?Musculoskeletal:  Negative for back pain.  ?Skin:  Negative for color change and rash.  ?Neurological:  Negative for syncope.  ?All other systems reviewed and are negative. ? ?Physical Exam ?Updated Vital Signs ?BP (!) 154/77   Pulse 71   Temp 98.4 ?F (36.9 ?C) (Oral)   Resp 19   Ht '5\' 11"'$  (1.803 m)   Wt 96.6 kg   SpO2 98%   BMI 29.71 kg/m?  ?Physical Exam ?Constitutional:   ?   Appearance: He is well-developed.  ?HENT:  ?   Head: Normocephalic.  ?   Nose: Nose normal.  ?Eyes:  ?   Extraocular Movements: Extraocular movements  intact.  ?Cardiovascular:  ?   Rate and Rhythm: Normal rate. Rhythm irregular.  ?Pulmonary:  ?   Effort: Pulmonary effort is normal.  ?Skin: ?   Coloration: Skin is not jaundiced.  ?Neurological:  ?   General: No focal deficit present.  ?   Mental Status: He is alert and oriented to person, place, and time. Mental status is at baseline.  ?   Cranial Nerves: No cranial nerve deficit.  ?   Motor: No weakness.  ?   Gait: Gait normal.  ? ? ?ED Results / Procedures / Treatments   ?Labs ?(all labs ordered are listed, but only abnormal results are displayed) ?Labs Reviewed  ?BASIC METABOLIC PANEL - Abnormal; Notable for the following components:  ?    Result Value  ? Sodium 132 (*)   ? Glucose, Bld 153 (*)   ? Calcium 8.7 (*)   ? All other components within normal limits  ?CBC - Abnormal; Notable for the following components:  ? RBC 3.42 (*)   ? Hemoglobin 10.3 (*)   ? HCT 30.3 (*)   ? All other components within normal limits  ?URINALYSIS, ROUTINE W REFLEX MICROSCOPIC - Abnormal; Notable for the following components:  ? Specific Gravity, Urine <1.005 (*)   ? All other components within normal limits  ?CBG MONITORING, ED - Abnormal; Notable for the following components:  ? Glucose-Capillary 148 (*)   ? All other components within normal limits  ?CBG MONITORING, ED  ? ? ?EKG ?EKG Interpretation ? 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 ?Radiology ?CT HEAD WO CONTRAST ? ?Result Date: 04/22/2021 ?CLINICAL DATA:  Seizures, no history of trauma or syncope EXAM: CT HEAD WITHOUT CONTRAST TECHNIQUE: Contiguous axial images were obtained from the base of the skull through the vertex without intravenous contrast. RADIATION DOSE REDUCTION: This exam  was performed according to the departmental dose-optimization program which includes automated exposure control, adjustment of the mA and/or kV according to patient size and/or use of iterative reconstruction technique. COMPARISON:  06/27/2013 FINDINGS: Brain: No evidence of acute infarction, hemorrhage, hydrocephalus, extra-axial collection or mass lesion/mass effect. Mild generalized cerebral atrophy. Vascular: No hyperdense vessel or unexpected calcification. Skull: No osseous abnormality. Sinuses/Orbits: Visualized paranasal sinuses are clear. Visualized mastoid sinuses are clear. Visualized orbits demonstrate no focal abnormality. Other: None IMPRESSION: 1. No acute intracranial pathology. 2. Mild generalized cerebral atrophy. Electronically Signed   By: Kathreen Devoid M.D.   On: 04/22/2021 15:42  ? ?  MR BRAIN W WO CONTRAST ? ?Result Date: 04/22/2021 ?CLINICAL DATA:  Initial evaluation for possible seizure. EXAM: MRI HEAD WITHOUT AND WITH CONTRAST TECHNIQUE: Multiplanar, multiecho pulse sequences of the brain and surrounding structures were obtained without and with intravenous contrast. CONTRAST:  15m GADAVIST GADOBUTROL 1 MMOL/ML IV SOLN COMPARISON:  CT from earlier the same day. FINDINGS: Brain: Cerebral volume within normal limits. No focal parenchymal signal abnormality or significant cerebral white matter disease. No abnormal foci of restricted diffusion to suggest acute or subacute ischemia or changes related to seizure. Gray-white matter differentiation maintained. No encephalomalacia to suggest chronic cortical infarction. No acute intracranial hemorrhage. Few punctate chronic micro hemorrhages noted, nonspecific, and of doubtful significance. No mass lesion, midline shift or mass effect. No hydrocephalus or extra-axial fluid collection. Pituitary gland and suprasellar region normal. Midline structures intact and normal. No intrinsic temporal lobe abnormality. No abnormal enhancement. Apparent  subcentimeter focus of enhancement involving the white matter of the right frontal corona radiata noted on coronal postcontrast sequence (series 23, image 15), not seen on corresponding sequences, and favored to be artifactua

## 2021-04-22 NOTE — Progress Notes (Addendum)
Contacted by Dr. Almyra Free about this patient who presented with possible first-time seizure. He was sitting on the commode, slid off, and had rhythmic jerking movements x30 seconds with urinary incontinence and brief post-event confusion. S/p AVR 1 week ago, new onset a fib, not on anticoagulation.  ? ?Recommend MRI brain with and without contrast and rEEG. If MRI does not show acute infarct, OK to start patient on therapeutic anticoagulation for a fib. If MRI shows acute infarct or other abnormality, please call neurology. If MRI WNL and rEEG WNL, no indication to start AED at this time. Outpatient referral to neurology should be placed at time of discharge. ? ?Please call neurology for any further questions. ? ?Su Monks, MD ?[facility] ?[phone] ? ?If 7pm- 7am, please page neurology on call as listed in [place]. ? ?Addendum 2147: MRI brain wwo is normal. Patient does not want to stay for EEG, this will be deferred to the outpatient setting. Patient to be started on eliquis prior to discharge from ED. Given that his event today was very convincing for seizure, he will be discharged on anticoagulation, and we do not have EEG at this time I think it is reasonable to discharge him on keppra '500mg'$  bid for safety reasons. This could potentially be weaned in the outpatient setting. I will arrange outpatient neuro f/u. ? ?Su Monks, MD ?[facility] ?[phone] ? ?If 7pm- 7am, please page neurology on call as listed in [place]. ? ?

## 2021-04-22 NOTE — ED Notes (Signed)
Patient transported to MRI 

## 2021-04-22 NOTE — Discharge Instructions (Addendum)
You will need to follow-up with neurology.  Please call to make an appointment with them. ? ?You will also need to follow-up with atrial fibrillation clinic.  They should be calling you, if they do not call you this week please call to make an appointment. ? ?Stop aspirin, continue Eliquis twice daily as well as Keppra twice daily in addition to your other medications. ? ?SEIZURE PRECAUTIONS ?Per [facility] statutes, patients with seizures are not allowed to drive until they have been seizure-free for six months. ?  ?Use caution when using heavy equipment or power tools. Avoid working on ladders or at heights. Take showers instead of baths. Ensure the water temperature is not too high on the home water heater. Do not go swimming alone. Do not lock yourself in a room alone (i.e. bathroom). When caring for infants or small children, sit down when holding, feeding, or changing them to minimize risk of injury to the child in the event you have a seizure. Maintain good sleep hygiene. Avoid alcohol. ?  ?If patient has another seizure, call 911 and bring them back to the ED if: ?A.  The seizure lasts longer than 5 minutes.      ?B.  The patient doesn't wake shortly after the seizure or has new problems such as difficulty seeing, speaking or moving following the seizure ?C.  The patient was injured during the seizure ?D.  The patient has a temperature over 102 F (39C) ?E.  The patient vomited during the seizure and now is having trouble breathing ? ? ?

## 2021-04-23 ENCOUNTER — Telehealth: Payer: Self-pay | Admitting: *Deleted

## 2021-04-23 NOTE — Telephone Encounter (Signed)
Patients sister called with an update of his ER visit from yesterday 3/29.  I reviewed all of the notes and test results and updated Dr. Cyndia Bent.  Patients sister said they got home around 1 AM from the ER and had another seizure-like episode that lasted around 1 minute.  He stood up from the car to the walker and developed a weird feeling, feet felt itchy, got real pale then went stiff (arched back/head and legs).  After a minute, he came to again.  He remembered it was about to happen and then doesn't remember anything else until he came to again.   ? ?He is now in the bed and has been since early this morning.  Still doesn't feel great.  His sister is going to go get a B/P cuff and check orthostatics and call me later with an update.  In the mean time, I spoke with Dr. Cyndia Bent about all of this and he wants to stop his Lopressor and lasix and see if this helps at all.  I have told her to make sure he stays hydrated.  I also got him an appt with the A-fib clinic tomorrow, 3/31.  She also wants to know if they can get [place] orders started so we will see if we can get this going for them. ? ?She will call me later today with an update.  There are no further questions. ?

## 2021-04-24 ENCOUNTER — Ambulatory Visit (HOSPITAL_COMMUNITY): Payer: Medicare HMO | Admitting: Physician Assistant

## 2021-04-24 ENCOUNTER — Other Ambulatory Visit: Payer: Self-pay

## 2021-04-24 ENCOUNTER — Telehealth (HOSPITAL_COMMUNITY): Payer: Self-pay | Admitting: *Deleted

## 2021-04-24 ENCOUNTER — Emergency Department (HOSPITAL_COMMUNITY): Payer: Medicare HMO

## 2021-04-24 ENCOUNTER — Inpatient Hospital Stay (HOSPITAL_COMMUNITY)
Admission: EM | Admit: 2021-04-24 | Discharge: 2021-04-26 | DRG: 312 | Disposition: A | Discharge status: Home / Self Care | Payer: Medicare HMO | Attending: Internal Medicine | Admitting: Internal Medicine

## 2021-04-24 ENCOUNTER — Encounter (HOSPITAL_COMMUNITY): Payer: Self-pay | Admitting: Internal Medicine

## 2021-04-24 DIAGNOSIS — Z888 Allergy status to other drugs, medicaments and biological substances status: Secondary | ICD-10-CM | POA: Diagnosis not present

## 2021-04-24 DIAGNOSIS — N179 Acute kidney failure, unspecified: Secondary | ICD-10-CM | POA: Diagnosis present

## 2021-04-24 DIAGNOSIS — Z8673 Personal history of transient ischemic attack (TIA), and cerebral infarction without residual deficits: Secondary | ICD-10-CM

## 2021-04-24 DIAGNOSIS — D649 Anemia, unspecified: Secondary | ICD-10-CM | POA: Diagnosis present

## 2021-04-24 DIAGNOSIS — Z79899 Other long term (current) drug therapy: Secondary | ICD-10-CM

## 2021-04-24 DIAGNOSIS — K5790 Diverticulosis of intestine, part unspecified, without perforation or abscess without bleeding: Secondary | ICD-10-CM | POA: Diagnosis present

## 2021-04-24 DIAGNOSIS — E860 Dehydration: Secondary | ICD-10-CM | POA: Diagnosis present

## 2021-04-24 DIAGNOSIS — Z91013 Allergy to seafood: Secondary | ICD-10-CM

## 2021-04-24 DIAGNOSIS — M159 Polyosteoarthritis, unspecified: Secondary | ICD-10-CM | POA: Diagnosis present

## 2021-04-24 DIAGNOSIS — I1310 Hypertensive heart and chronic kidney disease without heart failure, with stage 1 through stage 4 chronic kidney disease, or unspecified chronic kidney disease: Secondary | ICD-10-CM | POA: Diagnosis present

## 2021-04-24 DIAGNOSIS — Z885 Allergy status to narcotic agent status: Secondary | ICD-10-CM

## 2021-04-24 DIAGNOSIS — Z79890 Hormone replacement therapy (postmenopausal): Secondary | ICD-10-CM

## 2021-04-24 DIAGNOSIS — K648 Other hemorrhoids: Secondary | ICD-10-CM | POA: Diagnosis present

## 2021-04-24 DIAGNOSIS — F419 Anxiety disorder, unspecified: Secondary | ICD-10-CM | POA: Diagnosis present

## 2021-04-24 DIAGNOSIS — I350 Nonrheumatic aortic (valve) stenosis: Secondary | ICD-10-CM | POA: Diagnosis present

## 2021-04-24 DIAGNOSIS — F32A Depression, unspecified: Secondary | ICD-10-CM | POA: Diagnosis present

## 2021-04-24 DIAGNOSIS — Z953 Presence of xenogenic heart valve: Secondary | ICD-10-CM | POA: Diagnosis not present

## 2021-04-24 DIAGNOSIS — N1831 Chronic kidney disease, stage 3a: Secondary | ICD-10-CM | POA: Diagnosis present

## 2021-04-24 DIAGNOSIS — R42 Dizziness and giddiness: Secondary | ICD-10-CM | POA: Diagnosis not present

## 2021-04-24 DIAGNOSIS — Z96651 Presence of right artificial knee joint: Secondary | ICD-10-CM | POA: Diagnosis present

## 2021-04-24 DIAGNOSIS — I951 Orthostatic hypotension: Principal | ICD-10-CM | POA: Diagnosis present

## 2021-04-24 DIAGNOSIS — Z8719 Personal history of other diseases of the digestive system: Secondary | ICD-10-CM | POA: Diagnosis not present

## 2021-04-24 DIAGNOSIS — N401 Enlarged prostate with lower urinary tract symptoms: Secondary | ICD-10-CM | POA: Diagnosis present

## 2021-04-24 DIAGNOSIS — I952 Hypotension due to drugs: Secondary | ICD-10-CM | POA: Diagnosis present

## 2021-04-24 DIAGNOSIS — I10 Essential (primary) hypertension: Secondary | ICD-10-CM | POA: Diagnosis present

## 2021-04-24 DIAGNOSIS — R32 Unspecified urinary incontinence: Secondary | ICD-10-CM | POA: Diagnosis present

## 2021-04-24 DIAGNOSIS — Z952 Presence of prosthetic heart valve: Secondary | ICD-10-CM | POA: Diagnosis not present

## 2021-04-24 DIAGNOSIS — R55 Syncope and collapse: Secondary | ICD-10-CM | POA: Diagnosis not present

## 2021-04-24 DIAGNOSIS — Z7901 Long term (current) use of anticoagulants: Secondary | ICD-10-CM

## 2021-04-24 DIAGNOSIS — I440 Atrioventricular block, first degree: Secondary | ICD-10-CM | POA: Diagnosis present

## 2021-04-24 DIAGNOSIS — E7800 Pure hypercholesterolemia, unspecified: Secondary | ICD-10-CM | POA: Diagnosis present

## 2021-04-24 DIAGNOSIS — R7303 Prediabetes: Secondary | ICD-10-CM | POA: Diagnosis present

## 2021-04-24 DIAGNOSIS — R457 State of emotional shock and stress, unspecified: Secondary | ICD-10-CM | POA: Diagnosis present

## 2021-04-24 DIAGNOSIS — R569 Unspecified convulsions: Secondary | ICD-10-CM | POA: Diagnosis present

## 2021-04-24 DIAGNOSIS — Z820 Family history of epilepsy and other diseases of the nervous system: Secondary | ICD-10-CM

## 2021-04-24 DIAGNOSIS — Z91041 Radiographic dye allergy status: Secondary | ICD-10-CM

## 2021-04-24 DIAGNOSIS — M5136 Other intervertebral disc degeneration, lumbar region: Secondary | ICD-10-CM | POA: Diagnosis present

## 2021-04-24 DIAGNOSIS — E785 Hyperlipidemia, unspecified: Secondary | ICD-10-CM | POA: Diagnosis present

## 2021-04-24 DIAGNOSIS — I4892 Unspecified atrial flutter: Secondary | ICD-10-CM | POA: Diagnosis present

## 2021-04-24 DIAGNOSIS — E039 Hypothyroidism, unspecified: Secondary | ICD-10-CM | POA: Diagnosis present

## 2021-04-24 DIAGNOSIS — F4389 Other reactions to severe stress: Secondary | ICD-10-CM | POA: Diagnosis present

## 2021-04-24 DIAGNOSIS — N400 Enlarged prostate without lower urinary tract symptoms: Secondary | ICD-10-CM | POA: Diagnosis present

## 2021-04-24 LAB — BASIC METABOLIC PANEL
Anion gap: 12 (ref 5–15)
BUN: 11 mg/dL (ref 8–23)
CO2: 24 mmol/L (ref 22–32)
Calcium: 8.5 mg/dL — ABNORMAL LOW (ref 8.9–10.3)
Chloride: 98 mmol/L (ref 98–111)
Creatinine, Ser: 0.91 mg/dL (ref 0.61–1.24)
GFR, Estimated: >60 mL/min (ref >60)
Glucose, Bld: 119 mg/dL — ABNORMAL HIGH (ref 70–99)
Potassium: 3.5 mmol/L (ref 3.5–5.1)
Sodium: 134 mmol/L — ABNORMAL LOW (ref 135–145)

## 2021-04-24 LAB — TROPONIN I (HIGH SENSITIVITY)
Troponin I (High Sensitivity): 70 ng/L — ABNORMAL HIGH (ref <18)
Troponin I (High Sensitivity): 67 ng/L — ABNORMAL HIGH (ref <18)

## 2021-04-24 LAB — CBC
HCT: 32.3 % — ABNORMAL LOW (ref 39.0–52.0)
Hemoglobin: 10.9 g/dL — ABNORMAL LOW (ref 13.0–17.0)
MCH: 30.1 pg (ref 26.0–34.0)
MCHC: 33.7 g/dL (ref 30.0–36.0)
MCV: 89.2 fL (ref 80.0–100.0)
Platelets: 339 10*3/uL (ref 150–400)
RBC: 3.62 MIL/uL — ABNORMAL LOW (ref 4.22–5.81)
RDW: 12.9 % (ref 11.5–15.5)
WBC: 9 10*3/uL (ref 4.0–10.5)
nRBC: 0 % (ref 0.0–0.2)

## 2021-04-24 LAB — BRAIN NATRIURETIC PEPTIDE: B Natriuretic Peptide: 364.7 pg/mL — ABNORMAL HIGH (ref 0.0–100.0)

## 2021-04-24 LAB — MAGNESIUM: Magnesium: 2.1 mg/dL (ref 1.7–2.4)

## 2021-04-24 MED ORDER — ACETAMINOPHEN 500 MG PO TABS: 1000.0000 mg | ORAL_TABLET | Freq: Four times a day (QID) | ORAL | Status: DC | PRN

## 2021-04-24 MED ORDER — AMIODARONE HCL 200 MG PO TABS
200.0000 mg | ORAL_TABLET | Freq: Every day | ORAL | Status: DC
  Administered 2021-04-25 – 2021-04-26 (×2): 200 mg via ORAL
  Filled 2021-04-24 (×2): qty 1

## 2021-04-24 MED ORDER — CITALOPRAM HYDROBROMIDE 20 MG PO TABS
20.0000 mg | ORAL_TABLET | Freq: Every day | ORAL | Status: DC
  Administered 2021-04-25 – 2021-04-26 (×2): 20 mg via ORAL
  Filled 2021-04-24 (×2): qty 1

## 2021-04-24 MED ORDER — SODIUM CHLORIDE 0.9% FLUSH: 3.0000 mL | Freq: Two times a day (BID) | INTRAVENOUS | Status: DC

## 2021-04-24 MED ORDER — DOCUSATE SODIUM 100 MG PO CAPS: 100.0000 mg | ORAL_CAPSULE | Freq: Every day | ORAL | Status: DC | PRN

## 2021-04-24 MED ORDER — ONDANSETRON HCL 4 MG/2ML IJ SOLN: 4.0000 mg | Freq: Four times a day (QID) | INTRAMUSCULAR | Status: DC | PRN

## 2021-04-24 MED ORDER — TRAZODONE HCL 50 MG PO TABS
50.0000 mg | ORAL_TABLET | Freq: Every evening | ORAL | Status: DC | PRN
  Administered 2021-04-24 – 2021-04-25 (×2): 50 mg via ORAL
  Filled 2021-04-24 (×2): qty 1

## 2021-04-24 MED ORDER — ROSUVASTATIN CALCIUM 5 MG PO TABS
10.0000 mg | ORAL_TABLET | Freq: Every day | ORAL | Status: DC
  Administered 2021-04-25 – 2021-04-26 (×2): 10 mg via ORAL
  Filled 2021-04-24 (×2): qty 2

## 2021-04-24 MED ORDER — SODIUM CHLORIDE 0.9 % IV BOLUS
500.0000 mL | Freq: Once | INTRAVENOUS | Status: AC
  Administered 2021-04-24: 500 mL via INTRAVENOUS

## 2021-04-24 MED ORDER — SODIUM CHLORIDE 0.9% FLUSH: 3.0000 mL | INTRAVENOUS | Status: DC | PRN

## 2021-04-24 MED ORDER — SODIUM CHLORIDE 0.9% FLUSH
3.0000 mL | Freq: Two times a day (BID) | INTRAVENOUS | Status: DC
  Administered 2021-04-25: 3 mL via INTRAVENOUS

## 2021-04-24 MED ORDER — TAMSULOSIN HCL 0.4 MG PO CAPS
0.4000 mg | ORAL_CAPSULE | Freq: Every day | ORAL | Status: DC
  Administered 2021-04-25 – 2021-04-26 (×2): 0.4 mg via ORAL
  Filled 2021-04-24 (×2): qty 1

## 2021-04-24 MED ORDER — SODIUM CHLORIDE 0.9 % IV SOLN: INTRAVENOUS | Status: AC

## 2021-04-24 MED ORDER — ENOXAPARIN SODIUM 40 MG/0.4ML IJ SOSY
40.0000 mg | PREFILLED_SYRINGE | Freq: Every day | INTRAMUSCULAR | Status: DC
  Administered 2021-04-24: 40 mg via SUBCUTANEOUS
  Filled 2021-04-24: qty 0.4

## 2021-04-24 MED ORDER — MORPHINE SULFATE (PF) 2 MG/ML IV SOLN: 2.0000 mg | INTRAVENOUS | Status: DC | PRN

## 2021-04-24 MED ORDER — ENSURE ENLIVE PO LIQD
237.0000 mL | Freq: Two times a day (BID) | ORAL | Status: DC
  Administered 2021-04-25: 237 mL via ORAL

## 2021-04-24 MED ORDER — POLYETHYLENE GLYCOL 3350 17 G PO PACK: 17.0000 g | PACK | Freq: Every day | ORAL | Status: DC | PRN

## 2021-04-24 MED ORDER — SODIUM CHLORIDE 0.9 % IV SOLN: 250.0000 mL | INTRAVENOUS | Status: DC | PRN

## 2021-04-24 MED ORDER — APIXABAN 5 MG PO TABS
5.0000 mg | ORAL_TABLET | Freq: Two times a day (BID) | ORAL | Status: DC
  Administered 2021-04-24 – 2021-04-26 (×4): 5 mg via ORAL
  Filled 2021-04-24 (×4): qty 1

## 2021-04-24 MED ORDER — ONDANSETRON HCL 4 MG PO TABS: 4.0000 mg | ORAL_TABLET | Freq: Four times a day (QID) | ORAL | Status: DC | PRN

## 2021-04-24 MED ORDER — DULOXETINE HCL 60 MG PO CPEP
60.0000 mg | ORAL_CAPSULE | Freq: Every day | ORAL | Status: DC
  Administered 2021-04-25 – 2021-04-26 (×2): 60 mg via ORAL
  Filled 2021-04-24 (×2): qty 1

## 2021-04-24 MED ORDER — OXYCODONE HCL 5 MG PO TABS: 5.0000 mg | ORAL_TABLET | ORAL | Status: DC | PRN

## 2021-04-24 MED ORDER — LEVOTHYROXINE SODIUM 75 MCG PO TABS
150.0000 ug | ORAL_TABLET | Freq: Every day | ORAL | Status: DC
  Administered 2021-04-25 – 2021-04-26 (×2): 150 ug via ORAL
  Filled 2021-04-24 (×2): qty 2

## 2021-04-24 NOTE — Telephone Encounter (Addendum)
Patient sister called into clinic today stating patient is unable to sit up in the bed without becoming extremely pale and feeling presyncopal. Sister states they are pushing fluids but BP is running soft. Patient continues to have intermittent episodes of  developing a weird feeling, feet felt itchy, got real pale then went stiff (arched back/head and legs)After a minute, he was alert again. Instructed sister patient should proceed to ER for further workup and assessment since patient unable to safely sit/stand as well as continued feelings of presyncope. Pt sister stated understanding and agreement.  ?

## 2021-04-24 NOTE — ED Notes (Signed)
ED TO INPATIENT HANDOFF REPORT ? ?ED Nurse Name and Phone #: Jadarrius Maselli RN [phone] ? ?S ?Name/Age/Gender ?Randall Flores. ?66 y.o. ?male ?Room/Bed: 023C/023C ? ?Code Status ?  Code Status: Prior ? ?Home/SNF/Other ?Home ?Patient oriented to: self, place, time, and situation ?Is this baseline? Yes  ? ?Triage Complete: Triage complete  ?Chief Complaint ?Cardiac syncope [R55] ? ?Triage Note ?Pt here via EMS from home with c/o of seizure like activity and hypotension when sitting up.  ?Pt reports decreased urine output . Color brown to orange.  ? ?138/72 ?80HR  ?98% RA ?CBG 145  ? ?Allergies ?Allergies  ?Allergen Reactions  ? Codeine Nausea And Vomiting  ? Ivp Dye [Iodinated Contrast Media] Nausea And Vomiting  ?  Also felt very hot/flushed  ? Pravastatin Other (See Comments)  ?  Muscle Pain  ? Shellfish Allergy Nausea And Vomiting  ? ? ?Level of Care/Admitting Diagnosis ?ED Disposition   ? ? ED Disposition  ?Admit  ? Condition  ?--  ? Comment  ?Hospital Area: [facility] [[id number]] ? Level of Care: Progressive [102] ? Admit to Progressive based on following criteria: CARDIOVASCULAR & THORACIC of moderate stability with acute coronary syndrome symptoms/low risk myocardial infarction/hypertensive urgency/arrhythmias/heart failure potentially compromising stability and stable post cardiovascular intervention patients. ? May admit patient to [facility] or [facility] if equivalent level of care is available:: Yes ? Covid Evaluation: Asymptomatic - no recent exposure (last 10 days) testing not required ? Diagnosis: Cardiac syncope [388232] ? Admitting Physician: Lady Deutscher [[id number]] ? Attending Physician: Lady Deutscher [[id number]] ? Estimated length of stay: past midnight tomorrow ? Certification:: I certify this patient will need inpatient services for at least 2 midnights ?  ?  ? ?  ? ? ?B ?Medical/Surgery History ?Past Medical History:  ?Diagnosis Date  ? Anxiety   ? BPH with obstruction/lower  urinary tract symptoms   ? Colon polyp   ? Multiple  ? Complication of anesthesia   ? wakes up during anesthesia  ? DDD (degenerative disc disease), lumbar   ? Diverticulitis   ? Diverticulosis   ? Elevated PSA   ? Fatty liver 01/22/2015  ? Mild, noted on [place] Abd  ? Hepatic cyst 06/08/2016  ? Left, noted on CT renal  ? Hepatic hemangioma 02/03/2015  ? Small, noted on MRI Abd  ? History of colitis   ? History of kidney stones   ? Hypercholesteremia   ? Hypertension   ? Hypothyroidism   ? Major depression   ? Moderate aortic stenosis 05/24/2017  ? Noted on ECHO  ? Near syncope 04/25/2017  ? due to stress  ? OA (osteoarthritis)   ? knees, back, hands  ? Pre-diabetes   ? PVC (premature ventricular contraction)   ? Stroke [facility]) 2015  ? TIA/right side affected, right side has improved  ? Wears dentures   ? partial upper, front  ? ?Past Surgical History:  ?Procedure Laterality Date  ? AORTIC VALVE REPLACEMENT N/A 04/16/2021  ? Procedure: AORTIC VALVE REPLACEMENT (AVR) USING EDWARDS INSPIRIS AORTIC VALVE 25MM;  Surgeon: Gaye Pollack, MD;  Location: [place];  Service: Open Heart Surgery;  Laterality: N/A;  ? [place], 2001  ? [place] L5-S1  ? COLONOSCOPY    ? [place]  ? COLONOSCOPY WITH PROPOFOL N/A 01/21/2017  ? Procedure: COLONOSCOPY WITH PROPOFOL;  Surgeon: [name], MD;  Location: [place];  Service: Endoscopy;  Laterality: N/A;  ? POLYPECTOMY  01/21/2017  ? Procedure: POLYPECTOMY;  Surgeon: [name], MD;  Location: [place];  Service: Endoscopy;;  ? RIGHT HEART CATH AND CORONARY ANGIOGRAPHY N/A 03/10/2021  ? Procedure: RIGHT HEART CATH AND CORONARY ANGIOGRAPHY;  Surgeon: Nelva Bush, MD;  Location: [place] [facility];  Service: Cardiovascular;  Laterality: N/A;  ? TEE WITHOUT CARDIOVERSION N/A 04/16/2021  ? Procedure: TRANSESOPHAGEAL ECHOCARDIOGRAM (TEE);  Surgeon: Gaye Pollack, MD;  Location: [place];  Service: Open Heart Surgery;  Laterality: N/A;  ? TOTAL KNEE  ARTHROPLASTY Right 04/07/2018  ? Procedure: TOTAL KNEE ARTHROPLASTY;  Surgeon: Earlie Server, MD;  [facility];  Service: Orthopedics;  Laterality: Right;  ?  ? ?A ?IV Location/Drains/Wounds ?Patient Lines/Drains/Airways Status   ? ? Active Line/Drains/Airways   ? ? Name Placement date Placement time Site Days  ? Peripheral IV 04/24/21 20 G Posterior;Right Forearm 04/24/21  1254  Forearm  less than 1  ? Incision (Closed) 04/07/18 Knee Right 04/07/18  0927  -- 1113  ? Incision (Closed) 04/16/21 Chest Other (Comment) 04/16/21  0954  -- 8  ? ?  ?  ? ?  ? ? ?Intake/Output Last 24 hours ?No intake or output data in the 24 hours ending 04/24/21 2125 ? ?Labs/Imaging ?Results for orders placed or performed during the hospital encounter of 04/24/21 (from the past 48 hour(s))  ?Basic metabolic panel     Status: Abnormal  ? Collection Time: 04/24/21 12:50 PM  ?Result Value Ref Range  ? Sodium 134 (L) 135 - 145 mmol/L  ? Potassium 3.5 3.5 - 5.1 mmol/L  ? Chloride 98 98 - 111 mmol/L  ? CO2 24 22 - 32 mmol/L  ? Glucose, Bld 119 (H) 70 - 99 mg/dL  ?  Comment: Glucose reference range applies only to samples taken after fasting for at least 8 hours.  ? BUN 11 8 - 23 mg/dL  ? Creatinine, Ser 0.91 0.61 - 1.24 mg/dL  ? Calcium 8.5 (L) 8.9 - 10.3 mg/dL  ? GFR, Estimated >60 >60 mL/min  ?  Comment: (NOTE) ?Calculated using the CKD-EPI Creatinine Equation (2021) ?  ? Anion gap 12 5 - 15  ?  Comment: Performed at [facility], [place] [address] [place], [place] [address]  ?CBC     Status: Abnormal  ? Collection Time: 04/24/21 12:50 PM  ?Result Value Ref Range  ? WBC 9.0 4.0 - 10.5 K/uL  ? RBC 3.62 (L) 4.22 - 5.81 MIL/uL  ? Hemoglobin 10.9 (L) 13.0 - 17.0 g/dL  ? HCT 32.3 (L) 39.0 - 52.0 %  ? MCV 89.2 80.0 - 100.0 fL  ? MCH 30.1 26.0 - 34.0 pg  ? MCHC 33.7 30.0 - 36.0 g/dL  ? RDW 12.9 11.5 - 15.5 %  ? Platelets 339 150 - 400 K/uL  ? nRBC 0.0 0.0 - 0.2 %  ?  Comment: Performed at [facility], [place] [address],  [place], [place] [address]  ?Troponin I (High Sensitivity)     Status: Abnormal  ? Collection Time: 04/24/21 12:50 PM  ?Result Value Ref Range  ? Troponin I (High Sensitivity) 70 (H) <18 ng/L  ?  Comment: (NOTE) ?Elevated high sensitivity troponin I (hsTnI) values and significant  ?changes across serial measurements may suggest ACS but many other  ?chronic and acute conditions are known to elevate hsTnI results.  ?Refer to the "Links" section for chest pain algorithms and additional  ?guidance. ?Performed at [facility], [place]  [address], [place], [place] ?[address] ?  ?Brain natriuretic peptide     Status: Abnormal  ? Collection Time: 04/24/21 12:50 PM  ?Result Value Ref Range  ? B Natriuretic Peptide 364.7 (H) 0.0 - 100.0 pg/mL  ?  Comment: Performed at [facility], [place] [address], [place], [place] [address]  ?Magnesium     Status: None  ? Collection Time: 04/24/21 12:50 PM  ?Result Value Ref Range  ? Magnesium 2.1 1.7 - 2.4 mg/dL  ?  Comment: Performed at [facility], [place] [address], [place], [place] [address]  ?Troponin I (High Sensitivity)     Status: Abnormal  ? Collection Time: 04/24/21  5:18 PM  ?Result Value Ref Range  ? Troponin I (High Sensitivity) 67 (H) <18 ng/L  ?  Comment: (NOTE) ?Elevated high sensitivity troponin I (hsTnI) values and significant  ?changes across serial measurements may suggest ACS but many other  ?chronic and acute conditions are known to elevate hsTnI results.  ?Refer to the "Links" section for chest pain algorithms and additional  ?guidance. ?Performed at [facility], [place] [address] [place], [place] ?[address] ?  ? ?DG Chest Port 1 View ? ?Result Date: 04/24/2021 ?CLINICAL DATA:  Near-syncope and shortness of breath. EXAM: PORTABLE CHEST 1 VIEW COMPARISON:  Chest radiograph 04/22/2021; CT chest 04/02/2021 FINDINGS: Of note, the left costophrenic angle is not completely included on the field of view. Postoperative changes of median sternotomy and aortic valve  replacement. The heart size and mediastinal contours are within normal limits. Both lungs are clear. No pleural effusion or pneumothorax. No acute osseous abnormality. IMPRESSION: No acute cardiopulmonary abnormality. Electro

## 2021-04-24 NOTE — Consult Note (Addendum)
?Cardiology Consultation:  ? ?Patient ID: Randall Flores. ?MRN: [id number]; DOB: 1955/09/06 ? ?Admit date: 04/24/2021 ?Date of Consult: 04/24/2021 ? ?PCP:  Frazier Richards, MD ?  ?[facility] Providers ?Cardiologist:  Kate Sable, MD  ? ?Patient Profile:  ? ?Randall Flores. is a 66 y.o. male with a hx of recent AVR for bicuspid aortic valve, post-op atrial flutter, HTN, HLD, hypothyroidism, prediabetes, anxiety and depression who is being seen 04/24/2021 for the evaluation of syncope at the request of Dr. Sherry Ruffing. ? ?History of Present Illness:  ? ?Randall Flores underwent recent bioprosthetic AVR for bicuspid aortic valve on 04/16/21. Preoperative heart catheterization revealed no significant CAD. He was discharged in sinus rhythm on 04/20/21 with amiodarone and no anticoagulation.   ? ?He returned to [facility] 04/22/21 after an episode of seizure-like activity. He called out for help and his sister found him unresponsive with bilateral arm spasms, foaming at the mouth, and loss of bladder control. EKG with 4:1 atrial flutter. He was started on anticoagulation. MRI wwo nonacute. He was started on keppra. Pt did not stay in the ER to complete EEG. He got home from the ER at 1AM and had another seizure-like episode in which he stood up from the car to the walker and developed a weird feeling, felt itchy, pale and went stiff (arched back). He regained consciousness after about 1 minute. Sister called Dr. Vivi Martens office who advised stop lasix and lopressor and check orthostatic vitals. Afib clinic was recommended.  ? ?Today, 04/24/21, patient's sister called CTS office stating the pt is unable to sit up in bed without becoming extremely pale and feeling pre-syncopal with dizziness. He continues to have episodes of itchiness, becoming pale, and arching back. He was instructed to proceed to the ER. He states he has had poor PO intake and urine has been dark. Sister reports SBP 130-140 lying down and 102 sitting up prior  to arrival.  ? ?On arrival, BP 138/72, HR 80, O2 98%.  ? ?Dr. Cyndia Bent was consulted and suspected orthostatic hypotension with dehydration. He suspects the addition of atrial flutter may have caused his episodes. Cardiology was consulted for atrial flutter and syncope.  ? ?PTA medications include lisinopril, flomax, lasix, lopressor, and tramadol.  ? ?Orthostatic vitals have not been completed.  ? ?HS troponin 70 ?BNP 365 ?Hb 10.9 (10.3) ?sCr 0.91 ? ?TSH has not been checked ? ?He has received 500 cc NS and was able to sit up for about 10 minutes with improved color and no dizziness.  ? ?He denies chest pain. He admits to poor PO intake due to nausea. He reports rapid improvement with IV fluids. He is now lying flat. He is tearful regarding stress at home with his wife who has terminal brain cancer. Sister is at bedside and use to work as a Biomedical scientist in [place].  ? ? ?Past Medical History:  ?Diagnosis Date  ? Anxiety   ? BPH with obstruction/lower urinary tract symptoms   ? Colon polyp   ? Multiple  ? Complication of anesthesia   ? wakes up during anesthesia  ? DDD (degenerative disc disease), lumbar   ? Diverticulitis   ? Diverticulosis   ? Elevated PSA   ? Fatty liver 01/22/2015  ? Mild, noted on [place] Abd  ? Hepatic cyst 06/08/2016  ? Left, noted on CT renal  ? Hepatic hemangioma 02/03/2015  ? Small, noted on MRI Abd  ? History of colitis   ? History of  kidney stones   ? Hypercholesteremia   ? Hypertension   ? Hypothyroidism   ? Major depression   ? Moderate aortic stenosis 05/24/2017  ? Noted on ECHO  ? Near syncope 04/25/2017  ? due to stress  ? OA (osteoarthritis)   ? knees, back, hands  ? Pre-diabetes   ? PVC (premature ventricular contraction)   ? Stroke [facility]) 2015  ? TIA/right side affected, right side has improved  ? Wears dentures   ? partial upper, front  ? ? ?Past Surgical History:  ?Procedure Laterality Date  ? AORTIC VALVE REPLACEMENT N/A 04/16/2021  ? Procedure: AORTIC VALVE REPLACEMENT (AVR) USING  EDWARDS INSPIRIS AORTIC VALVE 25MM;  Surgeon: Gaye Pollack, MD;  Location: [place];  Service: Open Heart Surgery;  Laterality: N/A;  ? [place], 2001  ? [place] L5-S1  ? COLONOSCOPY    ? [place]  ? COLONOSCOPY WITH PROPOFOL N/A 01/21/2017  ? Procedure: COLONOSCOPY WITH PROPOFOL;  Surgeon: [name], MD;  Location: [place];  Service: Endoscopy;  Laterality: N/A;  ? POLYPECTOMY  01/21/2017  ? Procedure: POLYPECTOMY;  Surgeon: [name], MD;  Location: [place];  Service: Endoscopy;;  ? RIGHT HEART CATH AND CORONARY ANGIOGRAPHY N/A 03/10/2021  ? Procedure: RIGHT HEART CATH AND CORONARY ANGIOGRAPHY;  Surgeon: Nelva Bush, MD;  Location: [place] [facility];  Service: Cardiovascular;  Laterality: N/A;  ? TEE WITHOUT CARDIOVERSION N/A 04/16/2021  ? Procedure: TRANSESOPHAGEAL ECHOCARDIOGRAM (TEE);  Surgeon: Gaye Pollack, MD;  Location: [place];  Service: Open Heart Surgery;  Laterality: N/A;  ? TOTAL KNEE ARTHROPLASTY Right 04/07/2018  ? Procedure: TOTAL KNEE ARTHROPLASTY;  Surgeon: Earlie Server, MD;  [facility];  Service: Orthopedics;  Laterality: Right;  ?  ? ?Home Medications:  ?Prior to Admission medications   ?Medication Sig Start Date End Date Taking? Authorizing Provider  ?acetaminophen (TYLENOL) 500 MG tablet Take 1,000 mg by mouth every 6 (six) hours as needed for moderate pain.   Yes [provider]  ?amiodarone (PACERONE) 200 MG tablet Take 1 tablet (200 mg total) by mouth daily. 04/20/21  Yes Roddenberry, Arlis Porta, PA-C  ?cholecalciferol (VITAMIN D3) 25 MCG (1000 UNIT) tablet Take 1,000 Units by mouth daily.   Yes [provider]  ?citalopram (CELEXA) 20 MG tablet Take 20 mg by mouth daily.   Yes [provider]  ?DOCUSATE SODIUM PO Take 1 capsule by mouth daily as needed (stool softener).   Yes [provider]  ?DULoxetine (CYMBALTA) 60 MG capsule Take 60 mg by mouth daily.   Yes [provider]  ?levothyroxine  (SYNTHROID, LEVOTHROID) 150 MCG tablet Take 150 mcg by mouth daily before breakfast.    Yes [provider]  ?lisinopril (PRINIVIL,ZESTRIL) 10 MG tablet Take 10 mg by mouth daily.  07/30/14  Yes [provider]  ?Multiple Vitamin (MULTIVITAMIN) capsule Take 1 capsule by mouth daily.   Yes [provider]  ?OVER THE COUNTER MEDICATION Take 1 Dose by mouth daily as needed (mouth pain). Salt Rinse   Yes [provider]  ?rosuvastatin (CRESTOR) 10 MG tablet Take 10 mg by mouth daily.   Yes [provider]  ?tamsulosin (FLOMAX) 0.4 MG CAPS capsule Take 1 capsule (0.4 mg total) by mouth daily. 09/30/20  Yes Hollice Espy, MD  ?traMADol (ULTRAM) 50 MG tablet Take 1 tablet (50 mg total) by mouth every 6 (six) hours as needed for up to 7 days for moderate pain. 04/20/21 04/27/21 Yes  Antony Odea, PA-C  ?apixaban (ELIQUIS) 5 MG TABS tablet Take 1 tablet (5 mg total) by mouth 2 (two) times daily. ?Patient not taking: Reported on 04/24/2021 04/22/21 05/22/21  Luna Fuse, MD  ?furosemide (LASIX) 20 MG tablet Take 1 tablet (20 mg total) by mouth daily. ?Patient not taking: Reported on 04/24/2021 03/10/21 03/10/22  End, Harrell Gave, MD  ?hydrOXYzine (ATARAX/VISTARIL) 25 MG tablet Take 1 tablet (25 mg total) by mouth 3 (three) times daily as needed for anxiety. ?Patient not taking: Reported on 04/24/2021 08/25/20   Harvest Dark, MD  ?levETIRAcetam (KEPPRA) 500 MG tablet Take 1 tablet (500 mg total) by mouth 2 (two) times daily. ?Patient not taking: Reported on 04/24/2021 04/22/21 05/22/21  Luna Fuse, MD  ?metoprolol tartrate (LOPRESSOR) 25 MG tablet Take 1 tablet (25 mg total) by mouth 2 (two) times daily. ?Patient not taking: Reported on 04/24/2021 04/20/21   Antony Odea, PA-C  ? ? ?Inpatient Medications: ?Scheduled Meds: ? ?Continuous Infusions: ? ?PRN Meds: ? ? ?Allergies:    ?Allergies  ?Allergen Reactions  ? Codeine Nausea And Vomiting  ? Ivp Dye [Iodinated Contrast  Media] Nausea And Vomiting  ?  Also felt very hot/flushed  ? Pravastatin Other (See Comments)  ?  Muscle Pain  ? Shellfish Allergy Nausea And Vomiting  ? ? ?Social History:   ?Social History  ? ?Socioeconomi

## 2021-04-24 NOTE — ED Provider Notes (Signed)
?[place] ?Provider Note ? ? ?CSN: [id number] ?Arrival date & time: 04/24/21  1215 ? ?  ? ?History ? ?Chief Complaint  ?Patient presents with  ? Near Syncope  ? ? ?Randall Flores. is a 66 y.o. male. ? ?66 yo M with a chief complaints of feeling he will pass out every time he sits up.  This been going on for about 48 hours.  Recently had a valve replacement done.  He has had some difficulty eating and drinking but is not sure why.  Denies any pain with swallowing denies abdominal discomfort.  Denies chest pain.  Every time he sits up he feels very lightheaded like he might pass out and whenever he checks his blood pressure to be significantly lower.  He has been taking diuretics pretty consistently postprocedure.  He feels like he had quite a bit of urine output.  Feels like his urine is gotten a bit darker. ? ? ?Near Syncope ? ? ?  ? ?Home Medications ?Prior to Admission medications   ?Medication Sig Start Date End Date Taking? Authorizing Provider  ?acetaminophen (TYLENOL) 500 MG tablet Take 1,000 mg by mouth every 6 (six) hours as needed for moderate pain.   Yes [provider]  ?amiodarone (PACERONE) 200 MG tablet Take 1 tablet (200 mg total) by mouth daily. 04/20/21  Yes Roddenberry, Arlis Porta, PA-C  ?cholecalciferol (VITAMIN D3) 25 MCG (1000 UNIT) tablet Take 1,000 Units by mouth daily.   Yes [provider]  ?citalopram (CELEXA) 20 MG tablet Take 20 mg by mouth daily.   Yes [provider]  ?DOCUSATE SODIUM PO Take 1 capsule by mouth daily as needed (stool softener).   Yes [provider]  ?DULoxetine (CYMBALTA) 60 MG capsule Take 60 mg by mouth daily.   Yes [provider]  ?levothyroxine (SYNTHROID, LEVOTHROID) 150 MCG tablet Take 150 mcg by mouth daily before breakfast.    Yes [provider]  ?lisinopril (PRINIVIL,ZESTRIL) 10 MG tablet Take 10 mg by mouth daily.  07/30/14  Yes [provider]  ?Multiple  Vitamin (MULTIVITAMIN) capsule Take 1 capsule by mouth daily.   Yes [provider]  ?OVER THE COUNTER MEDICATION Take 1 Dose by mouth daily as needed (mouth pain). Salt Rinse   Yes [provider]  ?rosuvastatin (CRESTOR) 10 MG tablet Take 10 mg by mouth daily.   Yes [provider]  ?tamsulosin (FLOMAX) 0.4 MG CAPS capsule Take 1 capsule (0.4 mg total) by mouth daily. 09/30/20  Yes Hollice Espy, MD  ?traMADol (ULTRAM) 50 MG tablet Take 1 tablet (50 mg total) by mouth every 6 (six) hours as needed for up to 7 days for moderate pain. 04/20/21 04/27/21 Yes Roddenberry, Arlis Porta, PA-C  ?apixaban (ELIQUIS) 5 MG TABS tablet Take 1 tablet (5 mg total) by mouth 2 (two) times daily. ?Patient not taking: Reported on 04/24/2021 04/22/21 05/22/21  Luna Fuse, MD  ?furosemide (LASIX) 20 MG tablet Take 1 tablet (20 mg total) by mouth daily. ?Patient not taking: Reported on 04/24/2021 03/10/21 03/10/22  End, Harrell Gave, MD  ?hydrOXYzine (ATARAX/VISTARIL) 25 MG tablet Take 1 tablet (25 mg total) by mouth 3 (three) times daily as needed for anxiety. ?Patient not taking: Reported on 04/24/2021 08/25/20   Harvest Dark, MD  ?levETIRAcetam (KEPPRA) 500 MG tablet Take 1 tablet (500 mg total) by mouth 2 (two) times daily. ?Patient not taking: Reported on 04/24/2021 04/22/21 05/22/21  Luna Fuse, MD  ?metoprolol  tartrate (LOPRESSOR) 25 MG tablet Take 1 tablet (25 mg total) by mouth 2 (two) times daily. ?Patient not taking: Reported on 04/24/2021 04/20/21   Antony Odea, PA-C  ?   ? ?Allergies    ?Codeine, Ivp dye [iodinated contrast media], Pravastatin, and Shellfish allergy   ? ?Review of Systems   ?Review of Systems  ?Cardiovascular:  Positive for near-syncope.  ? ?Physical Exam ?Updated Vital Signs ?BP 129/80   Pulse 74   Resp 15   Ht '5\' 11"'$  (1.803 m)   Wt 96.6 kg   SpO2 100%   BMI 29.70 kg/m?  ?Physical Exam ?Vitals and nursing note reviewed.  ?Constitutional:   ?   Appearance: He is  well-developed.  ?HENT:  ?   Head: Normocephalic and atraumatic.  ?Eyes:  ?   Pupils: Pupils are equal, round, and reactive to light.  ?Neck:  ?   Vascular: No JVD.  ?Cardiovascular:  ?   Rate and Rhythm: Normal rate and regular rhythm.  ?   Heart sounds: No murmur heard. ?  No friction rub. No gallop.  ?Pulmonary:  ?   Effort: No respiratory distress.  ?   Breath sounds: No wheezing.  ?Abdominal:  ?   General: There is no distension.  ?   Tenderness: There is no abdominal tenderness. There is no guarding or rebound.  ?Musculoskeletal:     ?   General: Normal range of motion.  ?   Cervical back: Normal range of motion and neck supple.  ?Skin: ?   Coloration: Skin is not pale.  ?   Findings: No rash.  ?Neurological:  ?   Mental Status: He is alert and oriented to person, place, and time.  ?Psychiatric:     ?   Behavior: Behavior normal.  ? ? ?ED Results / Procedures / Treatments   ?Labs ?(all labs ordered are listed, but only abnormal results are displayed) ?Labs Reviewed  ?BASIC METABOLIC PANEL - Abnormal; Notable for the following components:  ?    Result Value  ? Sodium 134 (*)   ? Glucose, Bld 119 (*)   ? Calcium 8.5 (*)   ? All other components within normal limits  ?CBC - Abnormal; Notable for the following components:  ? RBC 3.62 (*)   ? Hemoglobin 10.9 (*)   ? HCT 32.3 (*)   ? All other components within normal limits  ?BRAIN NATRIURETIC PEPTIDE - Abnormal; Notable for the following components:  ? B Natriuretic Peptide 364.7 (*)   ? All other components within normal limits  ?TROPONIN I (HIGH SENSITIVITY) - Abnormal; Notable for the following components:  ? Troponin I (High Sensitivity) 70 (*)   ? All other components within normal limits  ?MAGNESIUM  ?TROPONIN I (HIGH SENSITIVITY)  ? ? ?EKG ?EKG Interpretation ? ?Date/Time:  Friday April 24 2021 12:20:31 EDT ?Ventricular Rate:  80 ?PR Interval:    ?QRS Duration: 90 ?QT Interval:  360 ?QTC Calculation: 416 ?R Axis:   71 ?Text Interpretation: Atrial flutter  with predominant 4:1 AV block Borderline low voltage, extremity leads No significant change since last tracing Confirmed by Deno Etienne [phone]) on 04/24/2021 1:09:10 PM ? ?Radiology ?CT HEAD WO CONTRAST ? ?Result Date: 04/22/2021 ?CLINICAL DATA:  Seizures, no history of trauma or syncope EXAM: CT HEAD WITHOUT CONTRAST TECHNIQUE: Contiguous axial images were obtained from the base of the skull through the vertex without intravenous contrast. RADIATION DOSE REDUCTION: This exam was performed according to the departmental dose-optimization program  which includes automated exposure control, adjustment of the mA and/or kV according to patient size and/or use of iterative reconstruction technique. COMPARISON:  06/27/2013 FINDINGS: Brain: No evidence of acute infarction, hemorrhage, hydrocephalus, extra-axial collection or mass lesion/mass effect. Mild generalized cerebral atrophy. Vascular: No hyperdense vessel or unexpected calcification. Skull: No osseous abnormality. Sinuses/Orbits: Visualized paranasal sinuses are clear. Visualized mastoid sinuses are clear. Visualized orbits demonstrate no focal abnormality. Other: None IMPRESSION: 1. No acute intracranial pathology. 2. Mild generalized cerebral atrophy. Electronically Signed   By: Kathreen Devoid M.D.   On: 04/22/2021 15:42  ? ?MR BRAIN W WO CONTRAST ? ?Result Date: 04/22/2021 ?CLINICAL DATA:  Initial evaluation for possible seizure. EXAM: MRI HEAD WITHOUT AND WITH CONTRAST TECHNIQUE: Multiplanar, multiecho pulse sequences of the brain and surrounding structures were obtained without and with intravenous contrast. CONTRAST:  17m GADAVIST GADOBUTROL 1 MMOL/ML IV SOLN COMPARISON:  CT from earlier the same day. FINDINGS: Brain: Cerebral volume within normal limits. No focal parenchymal signal abnormality or significant cerebral white matter disease. No abnormal foci of restricted diffusion to suggest acute or subacute ischemia or changes related to seizure. Gray-white matter  differentiation maintained. No encephalomalacia to suggest chronic cortical infarction. No acute intracranial hemorrhage. Few punctate chronic micro hemorrhages noted, nonspecific, and of doubtful significance. No mass l

## 2021-04-24 NOTE — ED Provider Notes (Signed)
3:22 PM ?Care assumed from Dr. Tyrone Nine.  At time of transfer care, patient patient is awaiting for cardiology evaluation as he recently had a valve replacement and now whenever he sits up he syncopized this.  Cards is coming to see patient to determine disposition. ? ?6:41 PM ?Cardiology saw patient and feel he would best be served with admission to medicine service with them following.  We will call medicine for admission.  ? ?Clinical Impression: ?1. Orthostatic hypotension   ?2. Syncope, unspecified syncope type   ? ? ?Disposition: Admit ? ?This note was prepared with assistance of Systems analyst. Occasional wrong-word or sound-a-like substitutions may have occurred due to the inherent limitations of voice recognition software. ? ? ?  ?Samya Siciliano, Gwenyth Allegra, MD ?04/24/21 1842 ? ?

## 2021-04-24 NOTE — H&P (Signed)
?History and Physical  ? ? ?[name] IWP:[id number] DOB: 08/11/1955 DOA: 04/24/2021 ? ?PCP: Frazier Richards, MD  ?Patient coming from: Home via EMS ? ?I have personally briefly reviewed patient's old medical records in [facility] ? ?Chief Complaint: Frequent episodes of syncope since 29th. ? ?HPI: Randall Flores. is a 66 y.o. male with medical history significant of median sternotomy for aortic valve replacement with a bioprosthetic valve on 04/16/2021 discharged from the hospital on 3/27, benign prostatic hypertrophy, anxiety, lumbar degenerative disc disease, diverticulosis, hypothyroidism, major depression, prediabetes, and stroke in 2015 who is the primary caretaker for his wife with metastatic breast cancer to brain liver bones and multiple areas (she continues to insist on receiving chemotherapy despite his bleeding that she consider palliative and hospice care).  ?Patient Sister called clinic today stating patient is unable to sit up in the bed without becoming pale and feeling presyncopal.  Has had 2 episodes of passing out.  He has pushed fluids but blood pressure is running soft.  Had intermittent episodes of developing an odd feeling, getting pale and then going stiff with an arched back head and legs.  After a minute patient was alert again.  Clinic instructed the patient's sister to proceed to the emergency department for further work-up and evaluation.  Patient is under an extreme amount of stress as his wife demands that he be her only caretaker.  Patient's mother and sisters are unable/unwilling to provide additional care for her.  They do provide a great deal of suggestions however.  Patient has been unable to stay hydrated and today upon presentation to the ER was noted to be so static and just the sitting position. ? ? ?ED Course: CT surgery was contacted after blood work was obtained.  They recommended cardiology evaluation in the emergency department.  Cardiology recommended  further boluses as well as starting normal saline at 75 an hour.  Also recommended stopping Lasix lisinopril and like metoprolol as well as strict INO's.  Audiology further recommended an outpatient TEE guided DCCV or DC CV after 3 weeks of an intraoperative oral anticoagulant and recommended admission by medicine service. ? ?Review of Systems: As per HPI otherwise all other systems reviewed and  negative.  ? ?Past Medical History:  ?Diagnosis Date  ? Anxiety   ? BPH with obstruction/lower urinary tract symptoms   ? Colon polyp   ? Multiple  ? Complication of anesthesia   ? wakes up during anesthesia  ? DDD (degenerative disc disease), lumbar   ? Diverticulitis   ? Diverticulosis   ? Elevated PSA   ? Fatty liver 01/22/2015  ? Mild, noted on [place] Abd  ? Hepatic cyst 06/08/2016  ? Left, noted on CT renal  ? Hepatic hemangioma 02/03/2015  ? Small, noted on MRI Abd  ? History of colitis   ? History of kidney stones   ? Hypercholesteremia   ? Hypertension   ? Hypothyroidism   ? Major depression   ? Moderate aortic stenosis 05/24/2017  ? Noted on ECHO  ? Near syncope 04/25/2017  ? due to stress  ? OA (osteoarthritis)   ? knees, back, hands  ? Pre-diabetes   ? PVC (premature ventricular contraction)   ? Stroke [facility]) 2015  ? TIA/right side affected, right side has improved  ? Wears dentures   ? partial upper, front  ? ? ?Past Surgical History:  ?Procedure Laterality Date  ? AORTIC VALVE REPLACEMENT N/A 04/16/2021  ?  Procedure: AORTIC VALVE REPLACEMENT (AVR) USING EDWARDS INSPIRIS AORTIC VALVE 25MM;  Surgeon: Gaye Pollack, MD;  Location: [facility];  Service: Open Heart Surgery;  Laterality: N/A;  ? [place], 2001  ?  L5-S1  ? COLONOSCOPY    ? [place]  ? COLONOSCOPY WITH PROPOFOL N/A 01/21/2017  ? Procedure: COLONOSCOPY WITH PROPOFOL;  Surgeon: [name], MD;  Location: [facility];  Service: Endoscopy;  Laterality: N/A;  ? POLYPECTOMY  01/21/2017  ? Procedure: POLYPECTOMY;  Surgeon: [name],  MD;  Location: [place];  Service: Endoscopy;;  ? RIGHT HEART CATH AND CORONARY ANGIOGRAPHY N/A 03/10/2021  ? Procedure: RIGHT HEART CATH AND CORONARY ANGIOGRAPHY;  Surgeon: Nelva Bush, MD;  Location: [facility] [facility];  Service: Cardiovascular;  Laterality: N/A;  ? TEE WITHOUT CARDIOVERSION N/A 04/16/2021  ? Procedure: TRANSESOPHAGEAL ECHOCARDIOGRAM (TEE);  Surgeon: Gaye Pollack, MD;  Location: [facility];  Service: Open Heart Surgery;  Laterality: N/A;  ? TOTAL KNEE ARTHROPLASTY Right 04/07/2018  ? Procedure: TOTAL KNEE ARTHROPLASTY;  Surgeon: Earlie Server, MD;  [facility];  Service: Orthopedics;  Laterality: Right;  ? ? ?Social History  ? ?Social History Narrative  ? Lives at home with wife   ? ? ? reports that he has never smoked. He has never used smokeless tobacco. He reports that he does not currently use alcohol. He reports current drug use. Drug: Marijuana. ? ?Is primary caretaker for his wife who has terminal breast cancer but insist on continuing with chemotherapy and treatments.  Patient has pleaded with his wife to switch to palliative care as he is having great difficulty caring for her and managing his own health problems.  Apparently they have an outpatient appointment with palliative care next week.  Patient is the financial and medical POA for his wife.  This is causing great stress for him and I am sure contributing to his inability to eat as well as anxiety related to her fatigue, strain and possibly contributing to his syncope. ? ?Allergies  ?Allergen Reactions  ? Codeine Nausea And Vomiting  ? Ivp Dye [Iodinated Contrast Media] Nausea And Vomiting  ?  Also felt very hot/flushed  ? Pravastatin Other (See Comments)  ?  Muscle Pain  ? Shellfish Allergy Nausea And Vomiting  ? ? ?Family History  ?Problem Relation Age of Onset  ? Alzheimer's disease Mother   ? ? ? ?Prior to Admission medications   ?Medication Sig Start Date End Date Taking? Authorizing Provider  ?acetaminophen  (TYLENOL) 500 MG tablet Take 1,000 mg by mouth every 6 (six) hours as needed for moderate pain.   Yes [provider]  ?amiodarone (PACERONE) 200 MG tablet Take 1 tablet (200 mg total) by mouth daily. 04/20/21  Yes Roddenberry, Arlis Porta, PA-C  ?cholecalciferol (VITAMIN D3) 25 MCG (1000 UNIT) tablet Take 1,000 Units by mouth daily.   Yes [provider]  ?citalopram (CELEXA) 20 MG tablet Take 20 mg by mouth daily.   Yes [provider]  ?DOCUSATE SODIUM PO Take 1 capsule by mouth daily as needed (stool softener).   Yes [provider]  ?DULoxetine (CYMBALTA) 60 MG capsule Take 60 mg by mouth daily.   Yes [provider]  ?levothyroxine (SYNTHROID, LEVOTHROID) 150 MCG tablet Take 150 mcg by mouth daily before breakfast.    Yes [provider]  ?lisinopril (PRINIVIL,ZESTRIL) 10 MG tablet Take 10 mg by mouth daily.  07/30/14  Yes [provider]  ?Multiple Vitamin (  MULTIVITAMIN) capsule Take 1 capsule by mouth daily.   Yes [provider]  ?OVER THE COUNTER MEDICATION Take 1 Dose by mouth daily as needed (mouth pain). Salt Rinse   Yes [provider]  ?rosuvastatin (CRESTOR) 10 MG tablet Take 10 mg by mouth daily.   Yes [provider]  ?tamsulosin (FLOMAX) 0.4 MG CAPS capsule Take 1 capsule (0.4 mg total) by mouth daily. 09/30/20  Yes Hollice Espy, MD  ?traMADol (ULTRAM) 50 MG tablet Take 1 tablet (50 mg total) by mouth every 6 (six) hours as needed for up to 7 days for moderate pain. 04/20/21 04/27/21 Yes Roddenberry, Arlis Porta, PA-C  ?apixaban (ELIQUIS) 5 MG TABS tablet Take 1 tablet (5 mg total) by mouth 2 (two) times daily. ?Patient not taking: Reported on 04/24/2021 04/22/21 05/22/21  Luna Fuse, MD  ?furosemide (LASIX) 20 MG tablet Take 1 tablet (20 mg total) by mouth daily. ?Patient not taking: Reported on 04/24/2021 03/10/21 03/10/22  End, Harrell Gave, MD  ?hydrOXYzine (ATARAX/VISTARIL) 25 MG tablet Take 1 tablet (25 mg total) by  mouth 3 (three) times daily as needed for anxiety. ?Patient not taking: Reported on 04/24/2021 08/25/20   Harvest Dark, MD  ?levETIRAcetam (KEPPRA) 500 MG tablet Take 1 tablet (500 mg total) by mouth 2

## 2021-04-24 NOTE — Plan of Care (Signed)

## 2021-04-24 NOTE — Consult Note (Signed)
? ?   ?[address] ?      [place] [address] ?            [phone]   ? ?  ? ?Reason for Consult: dizziness and near syncope s/p AVR ?Referring Physician: Deno Etienne, DO ? ?Randall Flores. is an 66 y.o. male.  ?HPI:  ? ?The patient is a 66 year old gentleman who underwent AVR by my on 04/16/2021 using a pericardial valve for severe aortic stenosis. He received a loading dose of amiodarone and started on a drip in the OR post pump due to runs of VT not responsive to lidocaine probably due to reperfusion. They resolved and he was continued on low dose amio po empirically since he was already loaded and had increased risk of atrial fib. He never had any atrial fib or flutter in the hospital postop and had an uneventful recovery. He looked great at POD 4 and was ambulating the halls without difficulty and sent home. He says he did well for two days but then developed dizziness, weakness and near syncope or syncope with seizure like activity when he got up walking. He came to the ER on 3/29 and labs were unremarkable. He was seen by neurology and CT and MRI of the brain were normal. EEG was recommended but he did not want to wait. He was discharge from the ER on Keppra empirically per neurology with outpt followup. The only new finding was that he was in atrial flutter with 4:1 block and ventricular rate in the 80's but cardiology was not consulted. My office talked with his sister who is with him at home multiple times yesterday and he remained weak, could not get up out of bed for fear of falling. She is with him now and says that his BP was 130-140 in bed lying down and dropped to 102 with sitting up. We told him to stop the low dose Lopressor and lasix 20 mg yesterday. He noted a lower volume of dark urine yesterday. Since his dizziness was not getting better today he was told to call EMS and come back to the OR. ? ?Past Medical History:  ?Diagnosis Date  ? Anxiety   ? BPH with  obstruction/lower urinary tract symptoms   ? Colon polyp   ? Multiple  ? Complication of anesthesia   ? wakes up during anesthesia  ? DDD (degenerative disc disease), lumbar   ? Diverticulitis   ? Diverticulosis   ? Elevated PSA   ? Fatty liver 01/22/2015  ? Mild, noted on [place] Abd  ? Hepatic cyst 06/08/2016  ? Left, noted on CT renal  ? Hepatic hemangioma 02/03/2015  ? Small, noted on MRI Abd  ? History of colitis   ? History of kidney stones   ? Hypercholesteremia   ? Hypertension   ? Hypothyroidism   ? Major depression   ? Moderate aortic stenosis 05/24/2017  ? Noted on ECHO  ? Near syncope 04/25/2017  ? due to stress  ? OA (osteoarthritis)   ? knees, back, hands  ? Pre-diabetes   ? PVC (premature ventricular contraction)   ? Stroke [facility]) 2015  ? TIA/right side affected, right side has improved  ? Wears dentures   ? partial upper, front  ? ? ?Past Surgical History:  ?Procedure Laterality Date  ? AORTIC VALVE REPLACEMENT N/A 04/16/2021  ? Procedure: AORTIC VALVE REPLACEMENT (AVR) USING EDWARDS INSPIRIS AORTIC VALVE 25MM;  Surgeon: Gaye Pollack,  MD;  [facility];  Service: Open Heart Surgery;  Laterality: N/A;  ? [place], 2001  ? [place] L5-S1  ? COLONOSCOPY    ? [place]  ? COLONOSCOPY WITH PROPOFOL N/A 01/21/2017  ? Procedure: COLONOSCOPY WITH PROPOFOL;  Surgeon: [name], MD;  Location: [facility];  Service: Endoscopy;  Laterality: N/A;  ? POLYPECTOMY  01/21/2017  ? Procedure: POLYPECTOMY;  Surgeon: [name], MD;  Location: [place];  Service: Endoscopy;;  ? RIGHT HEART CATH AND CORONARY ANGIOGRAPHY N/A 03/10/2021  ? Procedure: RIGHT HEART CATH AND CORONARY ANGIOGRAPHY;  Surgeon: Nelva Bush, MD;  Location: [facility];  Service: Cardiovascular;  Laterality: N/A;  ? TEE WITHOUT CARDIOVERSION N/A 04/16/2021  ? Procedure: TRANSESOPHAGEAL ECHOCARDIOGRAM (TEE);  Surgeon: Gaye Pollack, MD;  Location: [facility];  Service: Open Heart Surgery;  Laterality: N/A;  ?  TOTAL KNEE ARTHROPLASTY Right 04/07/2018  ? Procedure: TOTAL KNEE ARTHROPLASTY;  Surgeon: Earlie Server, MD;  [facility];  Service: Orthopedics;  Laterality: Right;  ? ? ?Family History  ?Problem Relation Age of Onset  ? Alzheimer's disease Mother   ? ? ?Social History:  reports that he has never smoked. He has never used smokeless tobacco. He reports that he does not currently use alcohol. He reports current drug use. Drug: Marijuana. ? ?Allergies:  ?Allergies  ?Allergen Reactions  ? Codeine Nausea And Vomiting  ? Ivp Dye [Iodinated Contrast Media] Nausea And Vomiting  ?  Also felt very hot/flushed  ? Pravastatin Other (See Comments)  ?  Muscle Pain  ? Shellfish Allergy Nausea And Vomiting  ? ? ?Medications: I have reviewed the patient's current medications. ?Prior to Admission: (Not in a hospital admission)  ?Scheduled: ?Continuous: ?PRN: ?Anti-infectives (From admission, onward)  ? ? None  ? ?  ? ? ?Results for orders placed or performed during the hospital encounter of 04/24/21 (from the past 48 hour(s))  ?Basic metabolic panel     Status: Abnormal  ? Collection Time: 04/24/21 12:50 PM  ?Result Value Ref Range  ? Sodium 134 (L) 135 - 145 mmol/L  ? Potassium 3.5 3.5 - 5.1 mmol/L  ? Chloride 98 98 - 111 mmol/L  ? CO2 24 22 - 32 mmol/L  ? Glucose, Bld 119 (H) 70 - 99 mg/dL  ?  Comment: Glucose reference range applies only to samples taken after fasting for at least 8 hours.  ? BUN 11 8 - 23 mg/dL  ? Creatinine, Ser 0.91 0.61 - 1.24 mg/dL  ? Calcium 8.5 (L) 8.9 - 10.3 mg/dL  ? GFR, Estimated >60 >60 mL/min  ?  Comment: (NOTE) ?Calculated using the CKD-EPI Creatinine Equation (2021) ?  ? Anion gap 12 5 - 15  ?  Comment: Performed at [facility], [place] [address], [place], [place] [address]  ?CBC     Status: Abnormal  ? Collection Time: 04/24/21 12:50 PM  ?Result Value Ref Range  ? WBC 9.0 4.0 - 10.5 K/uL  ? RBC 3.62 (L) 4.22 - 5.81 MIL/uL  ? Hemoglobin 10.9 (L) 13.0 - 17.0 g/dL  ? HCT 32.3 (L) 39.0 - 52.0  %  ? MCV 89.2 80.0 - 100.0 fL  ? MCH 30.1 26.0 - 34.0 pg  ? MCHC 33.7 30.0 - 36.0 g/dL  ? RDW 12.9 11.5 - 15.5 %  ? Platelets 339 150 - 400 K/uL  ? nRBC 0.0 0.0 - 0.2 %  ?  Comment: Performed at [facility]  Lab, [address], [place], [place] [address]  ?Troponin I (High Sensitivity)     Status: Abnormal  ? Collection Time: 04/24/21 12:50 PM  ?Result Value Ref Range  ? Troponin I (High Sensitivity) 70 (H) <18 ng/L  ?  Comment: (NOTE) ?Elevated high sensitivity troponin I (hsTnI) values and significant  ?changes across serial measurements may suggest ACS but many other  ?chronic and acute conditions are known to elevate hsTnI results.  ?Refer to the "Links" section for chest pain algorithms and additional  ?guidance. ?Performed at [facility], [place] [address] [place], [place] ?[address] ?  ?Brain natriuretic peptide     Status: Abnormal  ? Collection Time: 04/24/21 12:50 PM  ?Result Value Ref Range  ? B Natriuretic Peptide 364.7 (H) 0.0 - 100.0 pg/mL  ?  Comment: Performed at [facility], [place] [address] [place], [place] [address]  ?Magnesium     Status: None  ? Collection Time: 04/24/21 12:50 PM  ?Result Value Ref Range  ? Magnesium 2.1 1.7 - 2.4 mg/dL  ?  Comment: Performed at [facility], [place] [address] [place], [place] [address]  ? ? ?CT HEAD WO CONTRAST ? ?Result Date: 04/22/2021 ?CLINICAL DATA:  Seizures, no history of trauma or syncope EXAM: CT HEAD WITHOUT CONTRAST TECHNIQUE: Contiguous axial images were obtained from the base of the skull through the vertex without intravenous contrast. RADIATION DOSE REDUCTION: This exam was performed according to the departmental dose-optimization program which includes automated exposure control, adjustment of the mA and/or kV according to patient size and/or use of iterative reconstruction technique. COMPARISON:  06/27/2013 FINDINGS: Brain: No evidence of acute infarction, hemorrhage, hydrocephalus, extra-axial collection or mass lesion/mass  effect. Mild generalized cerebral atrophy. Vascular: No hyperdense vessel or unexpected calcification. Skull: No osseous abnormality. Sinuses/Orbits: Visualized paranasal sinuses are clear. Visualized mastoid sinuses

## 2021-04-24 NOTE — ED Triage Notes (Signed)
Pt here via EMS from home with c/o of seizure like activity and hypotension when sitting up.  ?Pt reports decreased urine output . Color brown to orange.  ? ?138/72 ?80HR  ?98% RA ?CBG 145 ?

## 2021-04-25 ENCOUNTER — Inpatient Hospital Stay (HOSPITAL_COMMUNITY): Payer: Medicare HMO

## 2021-04-25 DIAGNOSIS — R55 Syncope and collapse: Secondary | ICD-10-CM | POA: Diagnosis not present

## 2021-04-25 DIAGNOSIS — N400 Enlarged prostate without lower urinary tract symptoms: Secondary | ICD-10-CM | POA: Diagnosis present

## 2021-04-25 DIAGNOSIS — I951 Orthostatic hypotension: Secondary | ICD-10-CM | POA: Diagnosis not present

## 2021-04-25 DIAGNOSIS — E039 Hypothyroidism, unspecified: Secondary | ICD-10-CM | POA: Diagnosis not present

## 2021-04-25 DIAGNOSIS — E785 Hyperlipidemia, unspecified: Secondary | ICD-10-CM | POA: Diagnosis present

## 2021-04-25 DIAGNOSIS — I4892 Unspecified atrial flutter: Secondary | ICD-10-CM | POA: Diagnosis not present

## 2021-04-25 DIAGNOSIS — D649 Anemia, unspecified: Secondary | ICD-10-CM | POA: Diagnosis not present

## 2021-04-25 DIAGNOSIS — F32A Depression, unspecified: Secondary | ICD-10-CM | POA: Diagnosis present

## 2021-04-25 DIAGNOSIS — R457 State of emotional shock and stress, unspecified: Secondary | ICD-10-CM | POA: Diagnosis present

## 2021-04-25 DIAGNOSIS — Z952 Presence of prosthetic heart valve: Secondary | ICD-10-CM | POA: Diagnosis not present

## 2021-04-25 DIAGNOSIS — I10 Essential (primary) hypertension: Secondary | ICD-10-CM | POA: Diagnosis present

## 2021-04-25 LAB — COMPREHENSIVE METABOLIC PANEL
ALT: 63 U/L — ABNORMAL HIGH (ref 0–44)
AST: 28 U/L (ref 15–41)
Albumin: 2.9 g/dL — ABNORMAL LOW (ref 3.5–5.0)
Alkaline Phosphatase: 86 U/L (ref 38–126)
Anion gap: 5 (ref 5–15)
BUN: 10 mg/dL (ref 8–23)
CO2: 27 mmol/L (ref 22–32)
Calcium: 8.6 mg/dL — ABNORMAL LOW (ref 8.9–10.3)
Chloride: 104 mmol/L (ref 98–111)
Creatinine, Ser: 0.98 mg/dL (ref 0.61–1.24)
GFR, Estimated: >60 mL/min (ref >60)
Glucose, Bld: 129 mg/dL — ABNORMAL HIGH (ref 70–99)
Potassium: 3.9 mmol/L (ref 3.5–5.1)
Sodium: 136 mmol/L (ref 135–145)
Total Bilirubin: 0.2 mg/dL — ABNORMAL LOW (ref 0.3–1.2)
Total Protein: 5.4 g/dL — ABNORMAL LOW (ref 6.5–8.1)

## 2021-04-25 LAB — ECHOCARDIOGRAM COMPLETE
AR max vel: 4.5 cm2
AV Area VTI: 4.93 cm2
AV Area mean vel: 4.3 cm2
AV Mean grad: 5 mmHg
AV Peak grad: 8.3 mmHg
Ao pk vel: 1.44 m/s
Area-P 1/2: 2.6 cm2
Height: 71 in
S' Lateral: 3 cm
Weight: 3358.05 oz

## 2021-04-25 LAB — CBC
HCT: 26.9 % — ABNORMAL LOW (ref 39.0–52.0)
Hemoglobin: 9 g/dL — ABNORMAL LOW (ref 13.0–17.0)
MCH: 30.1 pg (ref 26.0–34.0)
MCHC: 33.5 g/dL (ref 30.0–36.0)
MCV: 90 fL (ref 80.0–100.0)
Platelets: 309 10*3/uL (ref 150–400)
RBC: 2.99 MIL/uL — ABNORMAL LOW (ref 4.22–5.81)
RDW: 12.9 % (ref 11.5–15.5)
WBC: 8.6 10*3/uL (ref 4.0–10.5)
nRBC: 0 % (ref 0.0–0.2)

## 2021-04-25 LAB — GLUCOSE, CAPILLARY
Glucose-Capillary: 121 mg/dL — ABNORMAL HIGH (ref 70–99)
Glucose-Capillary: 105 mg/dL — ABNORMAL HIGH (ref 70–99)

## 2021-04-25 LAB — TSH: TSH: 3.261 u[IU]/mL (ref 0.350–4.500)

## 2021-04-25 LAB — HIV ANTIBODY (ROUTINE TESTING W REFLEX): HIV Screen 4th Generation wRfx: NONREACTIVE

## 2021-04-25 MED ORDER — ADULT MULTIVITAMIN W/MINERALS CH
1.0000 | ORAL_TABLET | Freq: Every day | ORAL | Status: DC
  Administered 2021-04-25 – 2021-04-26 (×2): 1 via ORAL
  Filled 2021-04-25 (×2): qty 1

## 2021-04-25 MED ORDER — LEVETIRACETAM 500 MG PO TABS
500.0000 mg | ORAL_TABLET | Freq: Two times a day (BID) | ORAL | Status: DC
Start: 2021-04-25 — End: 2021-04-26
  Administered 2021-04-25 – 2021-04-26 (×2): 500 mg via ORAL
  Filled 2021-04-25 (×2): qty 1

## 2021-04-25 NOTE — Assessment & Plan Note (Signed)
Continue levothyroxine.  TSH is normal. ?

## 2021-04-25 NOTE — Progress Notes (Addendum)
? ?[facility] ?PROGRESS NOTE ? ? ?[name] OJJ:[id number] DOB: 1955-04-24 DOA: 04/24/2021  1 ?DOS: the patient was seen and examined on 04/25/2021 ? ?PCP: Frazier Richards, MD ? ?Brief History and Hospital Course:  ?66 y.o. male with medical history significant of median sternotomy for aortic valve replacement with a bioprosthetic valve on 04/16/2021 discharged from the hospital on 3/27, benign prostatic hypertrophy, anxiety, lumbar degenerative disc disease, diverticulosis, hypothyroidism, major depression, prediabetes, and stroke in 2015 who is the primary caretaker for his wife with metastatic breast cancer to brain liver bones and multiple areas (she continues to insist on receiving chemotherapy despite his bleeding that she consider palliative and hospice care).  Experienced near syncopal episode at home.  Was brought to the emergency department.  Noted to be profoundly orthostatic.  Admitted to the hospital for further management. ? ?Consultants: Cardiology.  Cardiothoracic surgery ? ?Procedures: Transthoracic echocardiogram is pending ? ? ? ?Subjective: ?Patient mentions that he is feeling much better this morning.  Slept well overnight.  Denies any chest pain shortness of breath.  No nausea vomiting. ? ? ? ?Assessment/Plan: ? ? ?* Syncope ?Likely secondary to orthostatic hypotension.  Patient has been under a lot of stress taking care of his wife at home who has advanced cancer.  Patient on multiple blood pressure lowering agents prior to admission including furosemide lisinopril and metoprolol.  All these medications have been held. ?There was also some concern for dehydration.  Patient was given IV fluids including fluid boluses and started on maintenance IV fluids.  Seems to be better this morning symptomatically.  Orthostatics to be rechecked.  Discussed with nursing staff.  Cardiology is following. ? ?Patient presented with suspected seizure and seen by neurology on 3/29. They recommended  EEG and MRI. Patient did not stay for EEG but MRI was negative. Neuro recommended Keppra. Symptoms could have been due to orthostatic hypotension. But will initiate Keppra for now and order EEG while he is here.  ? ?Atrial flutter ([place]) ?Developed atrial flutter after his surgery.  Amiodarone being continued.  Appears to have converted to sinus rhythm this morning.  Started on apixaban by cardiology.  TSH 3.26.  Echocardiogram is pending. ?Mildly elevated troponin levels likely due to recent cardiac surgery.  Patient denies any chest pain. ? ?S/P AVR ?This was done recently in March.  Still has sutures from the surgery.  Nursing staff to reach out to cardiothoracic to see if this can be removed.  Seems to be stable.  Echocardiogram is pending. ? ?Essential hypertension ?Holding his antihypertensives. ? ?Caregiver stress syndrome ?Patient taking care of his wife who has advanced cancer. ? ?Hypothyroidism ?Continue levothyroxine.  TSH is normal. ? ?Hyperlipidemia ?Continue statin ? ?Depression ?Continue Celexa. ? ?BPH (benign prostatic hyperplasia) ?Continue Flomax but if he continues to have orthostatic drop in his blood pressures then may have to consider stopping Flomax.   ? ?Normocytic anemia ?Hemoglobin noted to be low but stable compared to recent values.  Check anemia panel if not done recently.  No overt blood loss noted. ?Records reviewed.  He had a colonoscopy in December 2018 which showed polyps, diverticulosis and internal hemorrhoids. ? ? ? ? ?DVT Prophylaxis: Apixaban ?Code Status: Full code ?Family Communication: Discussed with patient ?Disposition Plan: Hopefully return home when medically stable ? ?Status is: Inpatient ?Remains inpatient appropriate because: Orthostatic hypotension, recent cardiac surgery, atrial flutter ? ? ? ? ?Medications: Scheduled: ? amiodarone  200 mg Oral Daily  ? apixaban  5 mg Oral BID  ? citalopram  20 mg Oral Daily  ? DULoxetine  60 mg Oral Daily  ? feeding supplement  237  mL Oral BID BM  ? levETIRAcetam  500 mg Oral BID  ? levothyroxine  150 mcg Oral QAC breakfast  ? multivitamin with minerals  1 tablet Oral Daily  ? rosuvastatin  10 mg Oral Daily  ? sodium chloride flush  3 mL Intravenous Q12H  ? sodium chloride flush  3 mL Intravenous Q12H  ? tamsulosin  0.4 mg Oral Daily  ? ?Continuous: ? sodium chloride 75 mL/hr at 04/25/21 0906  ? sodium chloride    ? ?ZOX:WRUEAV chloride, acetaminophen, docusate sodium, morphine injection, ondansetron **OR** ondansetron (ZOFRAN) IV, oxyCODONE, polyethylene glycol, sodium chloride flush, traZODone ? ?Antibiotics: ?Anti-infectives (From admission, onward)  ? ? None  ? ?  ? ? ?Objective: ? ?Vital Signs ? ?Vitals:  ? 04/24/21 2324 04/24/21 2327 04/25/21 0501 04/25/21 4098  ?BP: (!) 88/61 95/62 115/65 (P) 130/80  ?Pulse: 89 88 73   ?Resp: '19 20 16 '$ (P) 17  ?Temp:   98.4 ?F (36.9 ?C)   ?TempSrc:   Oral   ?SpO2: 96% 97% 95%   ?Weight:   95.2 kg   ?Height:      ? ? ?Intake/Output Summary (Last 24 hours) at 04/25/2021 1356 ?Last data filed at 04/25/2021 0503 ?Gross per 24 hour  ?Intake --  ?Output 400 ml  ?Net -400 ml  ? ?Filed Weights  ? 04/24/21 1224 04/24/21 2224 04/25/21 0501  ?Weight: 96.6 kg 94.3 kg 95.2 kg  ? ? ?General appearance: Awake alert.  In no distress ?Resp: Clear to auscultation bilaterally.  Normal effort ?Cardio: S1-S2 is normal regular.  No S3-S4.  No rubs murmurs or bruit ?GI: Abdomen is soft.  Nontender nondistended.  Bowel sounds are present normal.  No masses organomegaly ?Extremities: No edema.  Full range of motion of lower extremities. ?Neurologic: Alert and oriented x3.  No focal neurological deficits.  ? ? ?Lab Results: ? ?Data Reviewed: I have personally reviewed labs and imaging study reports ? ?CBC: ?Recent Labs  ?Lab 04/19/21 ?0121 04/22/21 ?1505 04/24/21 ?1250 04/25/21 ?0415  ?WBC 9.4 8.0 9.0 8.6  ?HGB 8.8* 10.3* 10.9* 9.0*  ?HCT 26.4* 30.3* 32.3* 26.9*  ?MCV 89.8 88.6 89.2 90.0  ?PLT 159 276 339 309  ? ? ?Basic Metabolic  Panel: ?Recent Labs  ?Lab 04/19/21 ?0121 04/22/21 ?1505 04/24/21 ?1250 04/25/21 ?0415  ?NA 134* 132* 134* 136  ?K 3.9 4.1 3.5 3.9  ?CL 101 99 98 104  ?CO2 '27 22 24 27  '$ ?GLUCOSE 87 153* 119* 129*  ?BUN '13 12 11 10  '$ ?CREATININE 0.87 1.05 0.91 0.98  ?CALCIUM 8.3* 8.7* 8.5* 8.6*  ?MG  --   --  2.1  --   ? ? ?GFR: ?Estimated Creatinine Clearance: 88.5 mL/min (by C-G formula based on SCr of 0.98 mg/dL). ? ?Liver Function Tests: ?Recent Labs  ?Lab 04/25/21 ?0415  ?AST 28  ?ALT 63*  ?ALKPHOS 86  ?BILITOT 0.2*  ?PROT 5.4*  ?ALBUMIN 2.9*  ? ? ? ?CBG: ?Recent Labs  ?Lab 04/18/21 ?2129 04/19/21 ?0006 04/22/21 ?1455 04/25/21 ?0715  ?GLUCAP 144* 91 148* 121*  ? ? ?Thyroid Function Tests: ?Recent Labs  ?  04/25/21 ?0415  ?TSH 3.261  ? ? ? ?Radiology Studies: ?DG Chest Port 1 View ? ?Result Date: 04/24/2021 ?CLINICAL DATA:  Near-syncope and shortness of breath. EXAM: PORTABLE CHEST 1 VIEW COMPARISON:  Chest radiograph 04/22/2021;  CT chest 04/02/2021 FINDINGS: Of note, the left costophrenic angle is not completely included on the field of view. Postoperative changes of median sternotomy and aortic valve replacement. The heart size and mediastinal contours are within normal limits. Both lungs are clear. No pleural effusion or pneumothorax. No acute osseous abnormality. IMPRESSION: No acute cardiopulmonary abnormality. Electronically Signed   By: Ileana Roup M.D.   On: 04/24/2021 12:48  ? ?ECHOCARDIOGRAM COMPLETE ? ?Result Date: 04/25/2021 ?   ECHOCARDIOGRAM REPORT   Patient Name:   Randall Flores. Date of Exam: 04/25/2021 Medical Rec #:  [id number]           Height:       71.0 in Accession #:    [id number]          Weight:       209.9 lb Date of Birth:  08-04-1955           BSA:          2.152 m? Patient Age:    43 years            BP:           115/65 mmHg Patient Gender: M                   HR:           75 bpm. Exam Location:  Inpatient Procedure: 2D Echo, Cardiac Doppler and Color Doppler Indications:    Syncope  History:         Patient has prior history of Echocardiogram examinations, most                 recent 04/16/2021. Aortic Valve Disease. 25 mm Edwards Inspiris                 aortic valve (procedure date 04/16/21).  Sonograp

## 2021-04-25 NOTE — Assessment & Plan Note (Signed)
Patient taking care of his wife who has advanced cancer. ?

## 2021-04-25 NOTE — Hospital Course (Signed)
66 y.o. male with medical history significant of median sternotomy for aortic valve replacement with a bioprosthetic valve on 04/16/2021 discharged from the hospital on 3/27, benign prostatic hypertrophy, anxiety, lumbar degenerative disc disease, diverticulosis, hypothyroidism, major depression, prediabetes, and stroke in 2015 who is the primary caretaker for his wife with metastatic breast cancer to brain liver bones and multiple areas (she continues to insist on receiving chemotherapy despite his bleeding that she consider palliative and hospice care).  Experienced near syncopal episode at home.  Was brought to the emergency department.  Noted to be profoundly orthostatic.  Admitted to the hospital for further management. ?

## 2021-04-25 NOTE — Assessment & Plan Note (Signed)
Continue Celexa

## 2021-04-25 NOTE — Procedures (Signed)
History: 66 yo M being evaluated for syncope ? ?Sedation: None  ? ?Technique: This EEG was acquired with electrodes placed according to the International 10-20 electrode system (including Fp1, Fp2, F3, F4, C3, C4, P3, P4, O1, O2, T3, T4, T5, T6, A1, A2, Fz, Cz, Pz). The following electrodes were missing or displaced: none. ? ? ?Background: The background consists of intermixed alpha and beta activities. There is a well defined posterior dominant rhythm of 8-9 Hz that attenuates with eye opening. There is anterior shifitng of the PDR with drowsiness, but sleep is not recorded.  ? ?Photic stimulation: Physiologic driving is not performed ? ?EEG Abnormalities: NOne ? ?Clinical Interpretation: This normal EEG is recorded in the waking and drowsy state. There was no seizure or seizure predisposition recorded on this study. Please note that lack of epileptiform activity on EEG does not preclude the possibility of epilepsy.  ? ?Roland Rack, MD ?[facility] ?[phone] ? ?If 7pm- 7am, please page neurology on call as listed in [place]. ? ?

## 2021-04-25 NOTE — Assessment & Plan Note (Addendum)
This was done recently in March.  Cardiothoracic is following.  Sutures to be discontinued prior to discharge. ?

## 2021-04-25 NOTE — Progress Notes (Signed)
?  Echocardiogram ?2D Echocardiogram has been performed. ? ?Randall Flores ?04/25/2021, 11:28 AM ?

## 2021-04-25 NOTE — Assessment & Plan Note (Addendum)
Developed atrial flutter after his surgery.  Amiodarone being continued.  ?Converted to sinus rhythm.  Started on apixaban.  Continue amiodarone at discharge.  ?TSH 3.26.  Echocardiogram was reviewed by cardiology. ?Mildly elevated troponin levels likely due to recent cardiac surgery.  Patient denies any chest pain. ?

## 2021-04-25 NOTE — Assessment & Plan Note (Addendum)
Hemoglobin noted to be low but stable compared to recent values.  Anemia panel was done which showed ferritin 123, iron 27, TIBC 273.  B12 535, folate 19.5.  ?No overt blood loss.  Mild drop in hemoglobin likely dilutional from all the IV fluids he received. ?Records reviewed.  He had a colonoscopy in December 2018 which showed polyps, diverticulosis and internal hemorrhoids. ?

## 2021-04-25 NOTE — Progress Notes (Signed)
? ?Progress Note ? ?Patient Name: Randall Flores. ?Date of Encounter: 04/25/2021 ? ?[facility] Cardiologist: Kate Sable, MD  ? ?Subjective  ? ?Reports feeling markedly improved this morning.  Denies any chest pain, dyspnea, lightheadedness ? ?Inpatient Medications  ?  ?Scheduled Meds: ? amiodarone  200 mg Oral Daily  ? apixaban  5 mg Oral BID  ? citalopram  20 mg Oral Daily  ? DULoxetine  60 mg Oral Daily  ? feeding supplement  237 mL Oral BID BM  ? levothyroxine  150 mcg Oral QAC breakfast  ? rosuvastatin  10 mg Oral Daily  ? sodium chloride flush  3 mL Intravenous Q12H  ? sodium chloride flush  3 mL Intravenous Q12H  ? tamsulosin  0.4 mg Oral Daily  ? ?Continuous Infusions: ? sodium chloride 75 mL/hr at 04/25/21 0117  ? sodium chloride    ? ?PRN Meds: ?sodium chloride, acetaminophen, docusate sodium, morphine injection, ondansetron **OR** ondansetron (ZOFRAN) IV, oxyCODONE, polyethylene glycol, sodium chloride flush, traZODone  ? ?Vital Signs  ?  ?Vitals:  ? 04/24/21 2319 04/24/21 2324 04/24/21 2327 04/25/21 0501  ?BP: 114/67 (!) 88/61 95/62 115/65  ?Pulse: 86 89 88 73  ?Resp: '20 19 20 16  '$ ?Temp:    98.4 ?F (36.9 ?C)  ?TempSrc:    Oral  ?SpO2: 97% 96% 97% 95%  ?Weight:    95.2 kg  ?Height:      ? ? ?Intake/Output Summary (Last 24 hours) at 04/25/2021 0816 ?Last data filed at 04/25/2021 0503 ?Gross per 24 hour  ?Intake --  ?Output 400 ml  ?Net -400 ml  ? ? ?  04/25/2021  ?  5:01 AM 04/24/2021  ? 10:24 PM 04/24/2021  ? 12:24 PM  ?Last 3 Weights  ?Weight (lbs) 209 lb 14.1 oz 207 lb 14.3 oz 212 lb 15.4 oz  ?Weight (kg) 95.2 kg 94.3 kg 96.6 kg  ?   ? ?Telemetry  ?  ?Normal sinus rhythm in 70s- Personally Reviewed ? ?ECG  ?  ?Normal sinus rhythm, rate 76, first-degree AV block, poor R wave progression- Personally Reviewed ? ?Physical Exam  ? ?GEN: No acute distress.   ?Neck: No JVD ?Cardiac: RRR, no murmurs, rubs, or gallops.  ?Respiratory: Clear to auscultation bilaterally. ?GI: Soft, nontender, non-distended   ?MS: No edema; No deformity. ?Neuro:  Nonfocal  ?Psych: Normal affect  ? ?Labs  ?  ?High Sensitivity Troponin:   ?Recent Labs  ?Lab 04/24/21 ?1250 04/24/21 ?1718  ?TROPONINIHS 70* 67*  ?   ?Chemistry ?Recent Labs  ?Lab 04/22/21 ?1505 04/24/21 ?1250 04/25/21 ?0415  ?NA 132* 134* 136  ?K 4.1 3.5 3.9  ?CL 99 98 104  ?CO2 '22 24 27  '$ ?GLUCOSE 153* 119* 129*  ?BUN '12 11 10  '$ ?CREATININE 1.05 0.91 0.98  ?CALCIUM 8.7* 8.5* 8.6*  ?MG  --  2.1  --   ?PROT  --   --  5.4*  ?ALBUMIN  --   --  2.9*  ?AST  --   --  28  ?ALT  --   --  63*  ?ALKPHOS  --   --  87  ?BILITOT  --   --  0.2*  ?GFRNONAA >60 >60 >60  ?ANIONGAP '11 12 5  '$ ?  ?Lipids No results for input(s): CHOL, TRIG, HDL, LABVLDL, LDLCALC, CHOLHDL in the last 168 hours.  ?Hematology ?Recent Labs  ?Lab 04/22/21 ?1505 04/24/21 ?1250 04/25/21 ?0415  ?WBC 8.0 9.0 8.6  ?RBC 3.42* 3.62* 2.99*  ?HGB  10.3* 10.9* 9.0*  ?HCT 30.3* 32.3* 26.9*  ?MCV 88.6 89.2 90.0  ?MCH 30.1 30.1 30.1  ?MCHC 34.0 33.7 33.5  ?RDW 12.4 12.9 12.9  ?PLT 276 339 309  ? ?Thyroid  ?Recent Labs  ?Lab 04/25/21 ?0415  ?TSH 3.261  ?  ?BNP ?Recent Labs  ?Lab 04/24/21 ?1250  ?BNP 364.7*  ?  ?DDimer No results for input(s): DDIMER in the last 168 hours.  ? ?Radiology  ?  ?DG Chest Port 1 View ? ?Result Date: 04/24/2021 ?CLINICAL DATA:  Near-syncope and shortness of breath. EXAM: PORTABLE CHEST 1 VIEW COMPARISON:  Chest radiograph 04/22/2021; CT chest 04/02/2021 FINDINGS: Of note, the left costophrenic angle is not completely included on the field of view. Postoperative changes of median sternotomy and aortic valve replacement. The heart size and mediastinal contours are within normal limits. Both lungs are clear. No pleural effusion or pneumothorax. No acute osseous abnormality. IMPRESSION: No acute cardiopulmonary abnormality. Electronically Signed   By: Ileana Roup M.D.   On: 04/24/2021 12:48   ? ?Cardiac Studies  ? ? ? ?Patient Profile  ?   ?66 y.o. male with a history of bicuspid aortic valve status post recent  AVR, postoperative atrial flutter, hypothyroidism, hypertension who we are consulted by Dr. Sherry Ruffing for evaluation of syncope ? ?Assessment & Plan  ?  ?Syncope ?- suspect he is dehydrated with orthostatic hypotension ?- symptoms dramatically improved with 500 cc NS bolus, started on 75 cc NS ?- will recheck orthostatic vitals this morning ?- stopped lasix, lisinopril and metoprolol ?- Check echocardiogram ?  ?Atrial flutter  ?- EKG with 4:1 flutter on admission.  Converted to sinus rhythm overnight ?- Continue amiodarone 200 mg daily.  Plan short-term course of Amio to maintain sinus rhythm as he recovers from his surgery ?-Continue Eliquis 5 mg twice daily ?  ?Need for chronic anticoagulation ?- has been started on eliquis 5 mg BID ?  ?Hypertension ?- will need to hydrate and see where is pressure falls ?- will conservatively treat HTN after adequate hydration ?  ? ?For questions or updates, please contact [place] ?Please consult [email or web address] for contact info under  ? ?  ?   ?Signed, ?Donato Heinz, MD  ?04/25/2021, 8:16 AM   ? ?

## 2021-04-25 NOTE — Progress Notes (Signed)
Eeg complete, pending resilts ?

## 2021-04-25 NOTE — Plan of Care (Signed)

## 2021-04-25 NOTE — Assessment & Plan Note (Addendum)
Antihypertensives on hold for now.  Cardiology will determine next steps at follow-up. ?

## 2021-04-25 NOTE — Assessment & Plan Note (Addendum)
Likely secondary to orthostatic hypotension.  Patient has been under a lot of stress taking care of his wife at home who has advanced cancer.  Patient on multiple blood pressure lowering agents prior to admission including furosemide lisinopril and metoprolol.  All these medications have been held. ?There was also some concern for dehydration.  Patient was given IV fluids including fluid boluses and started on maintenance IV fluids.  ?Symptoms have improved.  Orthostatic vital signs show improvement.  Seen by cardiology this morning.  Patient wants to go home.  Cleared for discharge.  Continue to hold the above-mentioned medications ? ?Patient presented with suspected seizure and seen by neurology on 3/29. They recommended EEG and MRI. Patient did not stay for EEG but MRI was negative. Neuro recommended Keppra. Symptoms could have been due to orthostatic hypotension.  Started on [place] as per neurology recommendations.  EEG does not show any epileptiform activity.  Neurology was supposed to arrange outpatient follow-up.  Will defer further management of this to neurology in the outpatient setting. ?

## 2021-04-25 NOTE — Progress Notes (Signed)
Initial Nutrition Assessment ? ?DOCUMENTATION CODES:  ? ?Not applicable ? ?INTERVENTION:  ? ?- Continue Ensure Enlive po BID, each supplement provides 350 kcal and 20 grams of protein ? ?- Liberalize diet to Regular ? ?- MVI with minerals daily ? ?NUTRITION DIAGNOSIS:  ? ?Inadequate oral intake related to decreased appetite as evidenced by per patient/family report. ? ?GOAL:  ? ?Patient will meet greater than or equal to 90% of their needs ? ?MONITOR:  ? ?PO intake, Supplement acceptance, Labs, Weight trends ? ?REASON FOR ASSESSMENT:  ? ?Malnutrition Screening Tool ?  ? ?ASSESSMENT:  ? ?66 year old male who presented to the ED on 3/31 with seizure-like activity and hypotension. PMH of recent AVR on 04/16/21 using a pericardial valve for severe aortic stenosis, post-op atrial flutter, HTN, HLD, hypothyroidism, anxiety, depression. Pt admitted with syncope likely secondary to orthostatic hypotension. ? ?RD working remotely. RD attempted to reach pt via phone call to room; however, no answer. Unable to obtain diet and weight history from pt at this time. ? ?Per notes, pt is primary caretaker for his wife who has terminal metastatic breast cancer. Per MD notes, pt with caregiver stress syndrome. ? ?Pt currently on a Heart Healthy diet with no meal completions charted. There are reports of poor PO intake in H&P. Given reports of poor PO intake, will liberalize diet to Regular to provide more options. Noted pt accepted morning dose of Ensure. Will continue with oral nutrition supplements. RD will also order daily MVI with minerals. ? ?Reviewed weight history in chart. Pt with a 3.3 kg weight loss since 04/14/21. This is a 3.4% weight loss in less than 2 weeks which is significant for timeframe. Pt at risk for acute malnutrition but RD unable to confirm without diet history and/or NFPE. ? ?Medications reviewed and include: Ensure Enlive BID ?IV:F NS @ 75 ml/hr ? ?Labs reviewed: hemoglobin 9.0 ?CBG's: 121 ? ?NUTRITION -  FOCUSED PHYSICAL EXAM: ? ?Unable to complete at this time. RD working remotely. ? ?Diet Order:   ?Diet Order   ? ?       ?  Diet regular Room service appropriate? Yes; Fluid consistency: Thin  Diet effective now       ?  ? ?  ?  ? ?  ? ? ?EDUCATION NEEDS:  ? ?Not appropriate for education at this time ? ?Skin:  Skin Assessment: Reviewed RN Assessment (incisions to abdomen and chest) ? ?Last BM:  no documented BM ? ?Height:  ? ?Ht Readings from Last 1 Encounters:  ?04/24/21 '5\' 11"'$  (1.803 m)  ? ? ?Weight:  ? ?Wt Readings from Last 1 Encounters:  ?04/25/21 95.2 kg  ? ? ?BMI:  Body mass index is 29.27 kg/m?. ? ?Estimated Nutritional Needs:  ? ?Kcal:  2200-2400 ? ?Protein:  110-125 grams ? ?Fluid:  >2.0 L ? ? ? ?Gustavus Bryant, MS, RD, LDN ?Inpatient Clinical Dietitian ?Please see [facility] for contact information. ? ?

## 2021-04-25 NOTE — Assessment & Plan Note (Signed)
Continue statin. 

## 2021-04-25 NOTE — Assessment & Plan Note (Addendum)
Flomax was continued since his orthostatic hypotension improved with hydration.  ?

## 2021-04-25 NOTE — Progress Notes (Addendum)
? ?   ?  [facility] [address] ?      [place] [address] ?            [phone]   ? ?  ?   ?Subjective: ?Says he rested very well last night and feels much better overall.  ? ?Converted back to SR last night.  ? ?Objective: ?Vital signs in last 24 hours: ?Temp:  [97.6 ?F (36.4 ?C)-98.4 ?F (36.9 ?C)] 98.4 ?F (36.9 ?C) (04/01 0501) ?Pulse Rate:  [73-89] 73 (04/01 0501) ?Cardiac Rhythm: Heart block (04/01 0752) ?Resp:  [15-24] (P) 17 (04/01 0321) ?BP: (88-137)/(61-90) (P) 130/80 (04/01 2248) ?SpO2:  [94 %-100 %] 95 % (04/01 0501) ?Weight:  [94.3 kg-96.6 kg] 95.2 kg (04/01 0501) ? ?  ? ?Intake/Output from previous day: ?03/31 0701 - 04/01 0700 ?In: -  ?Out: 400 [Urine:400] ?Intake/Output this shift: ?No intake/output data recorded. ? ?General appearance: alert, cooperative, and no distress ?Neurologic: intact ?Heart: RRR, expected soft systolic murmur c/w prosthetic tissue aortic valve.  ?Lungs: breath sounds are full, equal and clear.  ?Abdomen: soft, non-tender ?Extremities: no peripheral edema.  ?Wound: the sternotomy incision is intact and healing with no sign of complication. CT sites healing well and have silk sutures in place.  ? ?Lab Results: ?Recent Labs  ?  04/24/21 ?1250 04/25/21 ?0415  ?WBC 9.0 8.6  ?HGB 10.9* 9.0*  ?HCT 32.3* 26.9*  ?PLT 339 309  ? ?BMET:  ?Recent Labs  ?  04/24/21 ?1250 04/25/21 ?0415  ?NA 134* 136  ?K 3.5 3.9  ?CL 98 104  ?CO2 24 27  ?GLUCOSE 119* 129*  ?BUN 11 10  ?CREATININE 0.91 0.98  ?CALCIUM 8.5* 8.6*  ?  ?PT/INR: No results for input(s): LABPROT, INR in the last 72 hours. ?ABG ?   ?Component Value Date/Time  ? PHART 7.333 (L) 04/16/2021 1730  ? HCO3 21.2 04/16/2021 1730  ? TCO2 22 04/16/2021 1730  ? ACIDBASEDEF 5.0 (H) 04/16/2021 1730  ? O2SAT 97 04/16/2021 1730  ? ?CBG (last 3)  ?Recent Labs  ?  04/22/21 ?1455 04/25/21 ?0715  ?GLUCAP 148* 121*  ? ? ?Assessment/Plan: ?  ?-Randall Flores is s/p tissue AVR for severe aortic stenosis on 3/23 by Dr. Cyndia Bent. He was discharged on  3/27 after an uneventful plst-op course. Feeling much better after rehydration and conversion back to SR following re-admission yesterday for dizziness, weakness, and near-syncope. Echo planned for today.  ?Appreciate mgt by hospitalist team and cardiology.  ?Will remove the chest tube site sutures prior to discharge. We will continue to follow. ? ? LOS: 1 day  ? ? ?Randall Odea, PA-C ?04/25/2021 ? ? ? ?Agree with above ?Doing better ?Dispo planning ? ?Lajuana Matte ? ?

## 2021-04-26 DIAGNOSIS — I4892 Unspecified atrial flutter: Secondary | ICD-10-CM | POA: Diagnosis not present

## 2021-04-26 DIAGNOSIS — I951 Orthostatic hypotension: Secondary | ICD-10-CM | POA: Diagnosis not present

## 2021-04-26 DIAGNOSIS — Z952 Presence of prosthetic heart valve: Secondary | ICD-10-CM | POA: Diagnosis not present

## 2021-04-26 DIAGNOSIS — R55 Syncope and collapse: Secondary | ICD-10-CM | POA: Diagnosis not present

## 2021-04-26 LAB — COMPREHENSIVE METABOLIC PANEL
ALT: 56 U/L — ABNORMAL HIGH (ref 0–44)
AST: 25 U/L (ref 15–41)
Albumin: 2.9 g/dL — ABNORMAL LOW (ref 3.5–5.0)
Alkaline Phosphatase: 78 U/L (ref 38–126)
Anion gap: 5 (ref 5–15)
BUN: 8 mg/dL (ref 8–23)
CO2: 25 mmol/L (ref 22–32)
Calcium: 8.7 mg/dL — ABNORMAL LOW (ref 8.9–10.3)
Chloride: 106 mmol/L (ref 98–111)
Creatinine, Ser: 0.97 mg/dL (ref 0.61–1.24)
GFR, Estimated: >60 mL/min (ref >60)
Glucose, Bld: 112 mg/dL — ABNORMAL HIGH (ref 70–99)
Potassium: 4.2 mmol/L (ref 3.5–5.1)
Sodium: 136 mmol/L (ref 135–145)
Total Bilirubin: 0.4 mg/dL (ref 0.3–1.2)
Total Protein: 5.2 g/dL — ABNORMAL LOW (ref 6.5–8.1)

## 2021-04-26 LAB — CBC
HCT: 25.4 % — ABNORMAL LOW (ref 39.0–52.0)
Hemoglobin: 8.6 g/dL — ABNORMAL LOW (ref 13.0–17.0)
MCH: 29.9 pg (ref 26.0–34.0)
MCHC: 33.9 g/dL (ref 30.0–36.0)
MCV: 88.2 fL (ref 80.0–100.0)
Platelets: 320 10*3/uL (ref 150–400)
RBC: 2.88 MIL/uL — ABNORMAL LOW (ref 4.22–5.81)
RDW: 12.8 % (ref 11.5–15.5)
WBC: 7.6 10*3/uL (ref 4.0–10.5)
nRBC: 0 % (ref 0.0–0.2)

## 2021-04-26 LAB — IRON AND TIBC
Iron: 27 ug/dL — ABNORMAL LOW (ref 45–182)
Saturation Ratios: 10 % — ABNORMAL LOW (ref 17.9–39.5)
TIBC: 273 ug/dL (ref 250–450)
UIBC: 246 ug/dL

## 2021-04-26 LAB — GLUCOSE, CAPILLARY: Glucose-Capillary: 101 mg/dL — ABNORMAL HIGH (ref 70–99)

## 2021-04-26 LAB — RETICULOCYTES
Immature Retic Fract: 29.3 % — ABNORMAL HIGH (ref 2.3–15.9)
RBC.: 2.8 MIL/uL — ABNORMAL LOW (ref 4.22–5.81)
Retic Count, Absolute: 97.7 10*3/uL (ref 19.0–186.0)
Retic Ct Pct: 3.5 % — ABNORMAL HIGH (ref 0.4–3.1)

## 2021-04-26 LAB — FERRITIN: Ferritin: 123 ng/mL (ref 24–336)

## 2021-04-26 LAB — FOLATE: Folate: 19.5 ng/mL (ref >5.9)

## 2021-04-26 LAB — VITAMIN B12: Vitamin B-12: 535 pg/mL (ref 180–914)

## 2021-04-26 MED ORDER — LEVETIRACETAM 500 MG PO TABS: 500.0000 mg | ORAL_TABLET | Freq: Two times a day (BID) | ORAL | 0 refills | Status: DC

## 2021-04-26 MED ORDER — TRAZODONE HCL 50 MG PO TABS: 50.0000 mg | ORAL_TABLET | Freq: Every evening | ORAL | 0 refills | Status: AC | PRN

## 2021-04-26 MED ORDER — APIXABAN 5 MG PO TABS: 5.0000 mg | ORAL_TABLET | Freq: Two times a day (BID) | ORAL | 2 refills | Status: DC

## 2021-04-26 NOTE — Discharge Summary (Signed)
?[facility] ? ?Physician Discharge Summary  ? ?Patient ID: ?Randall Flores. ?MRN: [id number] ?DOB/AGE: [birth year] 66 y.o. ? ?Admit date: 04/24/2021 ?Discharge date: 04/26/2021   ? ?PCP: Frazier Richards, MD ? ?DISCHARGE DIAGNOSES:  ?Principal Problem: ?  Syncope ?Active Problems: ?  Orthostatic hypotension ?  Atrial flutter ([place]) ?  S/P AVR ?  Caregiver stress syndrome ?  Essential hypertension ?  Hypothyroidism ?  Hyperlipidemia ?  Depression ?  BPH (benign prostatic hyperplasia) ?  Normocytic anemia ? ? ?RECOMMENDATIONS FOR OUTPATIENT FOLLOW UP: ?Cardiology to arrange outpatient follow-up. ?Furosemide lisinopril and metoprolol placed on hold ? ? ?Home Health: None ?Equipment/Devices: None ? ?CODE STATUS: Full code ? ?DISCHARGE CONDITION: fair ? ?Diet recommendation: As before ? ?INITIAL HISTORY: ?66 y.o. male with medical history significant of median sternotomy for aortic valve replacement with a bioprosthetic valve on 04/16/2021 discharged from the hospital on 3/27, benign prostatic hypertrophy, anxiety, lumbar degenerative disc disease, diverticulosis, hypothyroidism, major depression, prediabetes, and stroke in 2015 who is the primary caretaker for his wife with metastatic breast cancer to brain liver bones and multiple areas (she continues to insist on receiving chemotherapy despite his bleeding that she consider palliative and hospice care).  Experienced near syncopal episode at home.  Was brought to the emergency department.  Noted to be profoundly orthostatic.  Admitted to the hospital for further management. ? ?Consultations: ?Cardiology ?Cardiothoracic surgery ? ?Procedures: ?Transthoracic echocardiogram ?EEG ? ? ?HOSPITAL COURSE:  ? ? ?* Syncope ?Likely secondary to orthostatic hypotension.  Patient has been under a lot of stress taking care of his wife at home who has advanced cancer.  Patient on multiple blood pressure lowering agents prior to admission including furosemide lisinopril and  metoprolol.  All these medications have been held. ?There was also some concern for dehydration.  Patient was given IV fluids including fluid boluses and started on maintenance IV fluids.  ?Symptoms have improved.  Orthostatic vital signs show improvement.  Seen by cardiology this morning.  Patient wants to go home.  Cleared for discharge.  Continue to hold the above-mentioned medications ? ?Patient presented with suspected seizure and seen by neurology on 3/29. They recommended EEG and MRI. Patient did not stay for EEG but MRI was negative. Neuro recommended Keppra. Symptoms could have been due to orthostatic hypotension.  Started on Valentine as per neurology recommendations.  EEG does not show any epileptiform activity.  Neurology was supposed to arrange outpatient follow-up.  Will defer further management of this to neurology in the outpatient setting. ? ?Atrial flutter ([place]) ?Developed atrial flutter after his surgery.  Amiodarone being continued.  ?Converted to sinus rhythm.  Started on apixaban.  Continue amiodarone at discharge.  ?TSH 3.26.  Echocardiogram was reviewed by cardiology. ?Mildly elevated troponin levels likely due to recent cardiac surgery.  Patient denies any chest pain. ? ?S/P AVR ?This was done recently in March.  Cardiothoracic is following.  Sutures to be discontinued prior to discharge. ? ?Essential hypertension ?Antihypertensives on hold for now.  Cardiology will determine next steps at follow-up. ? ?Caregiver stress syndrome ?Patient taking care of his wife who has advanced cancer. ? ?Hypothyroidism ?Continue levothyroxine.  TSH is normal. ? ?Hyperlipidemia ?Continue statin ? ?Depression ?Continue Celexa. ? ?BPH (benign prostatic hyperplasia) ?Flomax was continued since his orthostatic hypotension improved with hydration.  ? ?Normocytic anemia ?Hemoglobin noted to be low but stable compared to recent values.  Anemia panel was done which showed ferritin 123, iron 27, TIBC 273.  B12  535,  folate 19.5.  ?No overt blood loss.  Mild drop in hemoglobin likely dilutional from all the IV fluids he received. ?Records reviewed.  He had a colonoscopy in December 2018 which showed polyps, diverticulosis and internal hemorrhoids. ? ? ? ?Patient is stable.  Cleared by cardiology for discharge.  Patient wants to go home.  Okay for discharge today. ? ? ?PERTINENT LABS: ? ?The results of significant diagnostics from this hospitalization (including imaging, microbiology, ancillary and laboratory) are listed below for reference.   ? ? ?Labs: ? ?COVID-19 Labs ? ?Recent Labs  ?  04/26/21 ?0056  ?FERRITIN 123  ? ? ?Lab Results  ?Component Value Date  ? [place] NEGATIVE 04/14/2021  ? [place] NEGATIVE 08/25/2020  ? [place] Not Detected 10/08/2019  ? [place] NEGATIVE 11/02/2018  ? ? ? ? ?Basic Metabolic Panel: ?Recent Labs  ?Lab 04/22/21 ?1505 04/24/21 ?1250 04/25/21 ?0415 04/26/21 ?0056  ?NA 132* 134* 136 136  ?K 4.1 3.5 3.9 4.2  ?CL 99 98 104 106  ?CO2 '22 24 27 25  '$ ?GLUCOSE 153* 119* 129* 112*  ?BUN '12 11 10 8  '$ ?CREATININE 1.05 0.91 0.98 0.97  ?CALCIUM 8.7* 8.5* 8.6* 8.7*  ?MG  --  2.1  --   --   ? ?Liver Function Tests: ?Recent Labs  ?Lab 04/25/21 ?0415 04/26/21 ?4401  ?AST 28 25  ?ALT 63* 56*  ?ALKPHOS 86 78  ?BILITOT 0.2* 0.4  ?PROT 5.4* 5.2*  ?ALBUMIN 2.9* 2.9*  ? ? ?CBC: ?Recent Labs  ?Lab 04/22/21 ?1505 04/24/21 ?1250 04/25/21 ?0415 04/26/21 ?0056  ?WBC 8.0 9.0 8.6 7.6  ?HGB 10.3* 10.9* 9.0* 8.6*  ?HCT 30.3* 32.3* 26.9* 25.4*  ?MCV 88.6 89.2 90.0 88.2  ?PLT 276 339 309 320  ? ? ?BNP: ?BNP (last 3 results) ?Recent Labs  ?  04/24/21 ?1250  ?BNP 364.7*  ? ? ? ?CBG: ?Recent Labs  ?Lab 04/22/21 ?1455 04/25/21 ?0715 04/25/21 ?2124 04/26/21 ?0272  ?GLUCAP 148* 121* 105* 101*  ? ? ? ?IMAGING STUDIES ? ?DG Chest Port 1 View ? ?Result Date: 04/24/2021 ?CLINICAL DATA:  Near-syncope and shortness of breath. EXAM: PORTABLE CHEST 1 VIEW COMPARISON:  Chest radiograph 04/22/2021; CT chest 04/02/2021 FINDINGS: Of  note, the left costophrenic angle is not completely included on the field of view. Postoperative changes of median sternotomy and aortic valve replacement. The heart size and mediastinal contours are within normal limits. Both lungs are clear. No pleural effusion or pneumothorax. No acute osseous abnormality. IMPRESSION: No acute cardiopulmonary abnormality. Electronically Signed   By: Ileana Roup M.D.   On: 04/24/2021 12:48  ? ? ?EEG adult ? ?Result Date: 04/25/2021 ?Greta Doom, MD     04/25/2021  8:21 PM History: 66 yo M being evaluated for syncope Sedation: None Technique: This EEG was acquired with electrodes placed according to the International 10-20 electrode system (including Fp1, Fp2, F3, F4, C3, C4, P3, P4, O1, O2, T3, T4, T5, T6, A1, A2, Fz, Cz, Pz). The following electrodes were missing or displaced: none. Background: The background consists of intermixed alpha and beta activities. There is a well defined posterior dominant rhythm of 8-9 Hz that attenuates with eye opening. There is anterior shifitng of the PDR with drowsiness, but sleep is not recorded. Photic stimulation: Physiologic driving is not performed EEG Abnormalities: NOne Clinical Interpretation: This normal EEG is recorded in the waking and drowsy state. There was no seizure or seizure predisposition recorded on this study. Please note that lack of epileptiform activity  on EEG does not preclude the possibility of epilepsy. Roland Rack, MD [facility] [phone] If 7pm- 7am, please page neurology on call as listed in [place].  ? ?ECHOCARDIOGRAM COMPLETE ? ?Result Date: 04/25/2021 ?   ECHOCARDIOGRAM REPORT   Patient Name:   Olanrewaju Osborn. Date of Exam: 04/25/2021 Medical Rec #:  [id number]           Height:       71.0 in Accession #:    [id number]          Weight:       209.9 lb Date of Birth:  06-Jul-1955           BSA:          2.152 m? Patient Age:    64 years            BP:           115/65 mmHg Patient Gender: M                    HR:           75 bpm. Exam Location:  Inpatient Procedure: 2D Echo, Cardiac Doppler and Color Doppler Indications:    Syncope  History:        Patient has prior history of Echocardiogram examinat

## 2021-04-26 NOTE — Progress Notes (Addendum)
? ?   ?  [address] ?      [place] [address] ?            [phone]   ? ?  ?   ?Subjective: ?Says he rested very well last night and feels good.  ?He just returned from a walk in the hall and said he had no dizziness while ambulating.  ?Says he would like to return home.  ? ? ?Objective: ?Vital signs in last 24 hours: ?Temp:  [98.5 ?F (36.9 ?C)-98.9 ?F (37.2 ?C)] 98.9 ?F (37.2 ?C) (04/02 0753) ?Pulse Rate:  [78] 78 (04/02 0329) ?Cardiac Rhythm: Normal sinus rhythm (04/02 0724) ?Resp:  [18-20] 20 (04/02 0753) ?BP: (103-125)/(66-79) 120/72 (04/02 0753) ?SpO2:  [95 %-97 %] 97 % (04/02 0329) ?Weight:  [96.6 kg] 96.6 kg (04/02 0508) ? ?  ? ?Intake/Output from previous day: ?04/01 0701 - 04/02 0700 ?In: 720 [P.O.:720] ?Out: 2025 [Urine:2025] ?Intake/Output this shift: ?Total I/O ?In: -  ?Out: 275 [Urine:275] ? ?General appearance: alert, cooperative, and no distress ?Neurologic: intact ?Heart: RRR, expected soft systolic murmur c/w prosthetic tissue aortic valve.  ?Lungs: breath sounds are full, equal and clear.  ?Abdomen: soft, non-tender ?Extremities: no peripheral edema.  ?Wound: the sternotomy incision is intact and healing with no sign of complication. CT sites healing well and have silk sutures in place.  ? ?Lab Results: ?Recent Labs  ?  04/25/21 ?0415 04/26/21 ?0056  ?WBC 8.6 7.6  ?HGB 9.0* 8.6*  ?HCT 26.9* 25.4*  ?PLT 309 320  ? ? ?BMET:  ?Recent Labs  ?  04/25/21 ?0415 04/26/21 ?0056  ?NA 136 136  ?K 3.9 4.2  ?CL 104 106  ?CO2 27 25  ?GLUCOSE 129* 112*  ?BUN 10 8  ?CREATININE 0.98 0.97  ?CALCIUM 8.6* 8.7*  ? ?  ?PT/INR: No results for input(s): LABPROT, INR in the last 72 hours. ?ABG ?   ?Component Value Date/Time  ? PHART 7.333 (L) 04/16/2021 1730  ? HCO3 21.2 04/16/2021 1730  ? TCO2 22 04/16/2021 1730  ? ACIDBASEDEF 5.0 (H) 04/16/2021 1730  ? O2SAT 97 04/16/2021 1730  ? ?CBG (last 3)  ?Recent Labs  ?  04/25/21 ?0715 04/25/21 ?2124 04/26/21 ?0613  ?GLUCAP 121* 105* 101*   ? ? ? ?Assessment/Plan: ?  ?-Randall Flores is s/p tissue AVR for severe aortic stenosis on 3/23 by Dr. Cyndia Bent. He was discharged on 3/27 after an uneventful post-op course. Feeling much better after rehydration and conversion back to SR following re-admission 3/31 for dizziness, weakness, and near-syncope. Echo and EEG done yesterday were without any pathological findings.  ?Appreciate mgt by hospitalist team and cardiology.  ?OK to discharge to home from CT surgery perspective. He has scheduled follow up with [place] on 4/26 at 1:30pm. ? ?Will remove the chest tube site sutures today prior to discharge.  ? ? LOS: 2 days  ? ?Antony Odea, PA-C ?04/26/2021 ? ? ?Agree with above ?Stable for discharge ? ?Lajuana Matte ? ?

## 2021-04-26 NOTE — Progress Notes (Signed)
RN removed PIV and went over d/c summary. Belongings w/ pt. [name] sister transporting pt home. NT transporting pt to private vehicle. ?

## 2021-04-26 NOTE — Progress Notes (Signed)
? ?Progress Note ? ?Patient Name: Randall Flores. ?Date of Encounter: 04/26/2021 ? ?[facility] Cardiologist: Kate Sable, MD  ? ?Subjective  ? ?Orthostatics this morning show orthostatic hypotension has resolved.  He denies any chest pain or dyspnea. ? ?Inpatient Medications  ?  ?Scheduled Meds: ? amiodarone  200 mg Oral Daily  ? apixaban  5 mg Oral BID  ? citalopram  20 mg Oral Daily  ? DULoxetine  60 mg Oral Daily  ? feeding supplement  237 mL Oral BID BM  ? levETIRAcetam  500 mg Oral BID  ? levothyroxine  150 mcg Oral QAC breakfast  ? multivitamin with minerals  1 tablet Oral Daily  ? rosuvastatin  10 mg Oral Daily  ? sodium chloride flush  3 mL Intravenous Q12H  ? sodium chloride flush  3 mL Intravenous Q12H  ? tamsulosin  0.4 mg Oral Daily  ? ?Continuous Infusions: ? sodium chloride    ? ?PRN Meds: ?sodium chloride, acetaminophen, docusate sodium, morphine injection, ondansetron **OR** ondansetron (ZOFRAN) IV, oxyCODONE, polyethylene glycol, sodium chloride flush, traZODone  ? ?Vital Signs  ?  ?Vitals:  ? 04/25/21 2341 04/26/21 0329 04/26/21 5035 04/26/21 0753  ?BP: 125/66 103/79  120/72  ?Pulse:  78    ?Resp: '19 19  20  '$ ?Temp: 98.9 ?F (37.2 ?C) 98.5 ?F (36.9 ?C)  98.9 ?F (37.2 ?C)  ?TempSrc: Oral Oral    ?SpO2: 95% 97%    ?Weight:   96.6 kg   ?Height:      ? ? ?Intake/Output Summary (Last 24 hours) at 04/26/2021 0907 ?Last data filed at 04/26/2021 0754 ?Gross per 24 hour  ?Intake 480 ml  ?Output 2300 ml  ?Net -1820 ml  ? ? ? ?  04/26/2021  ?  5:08 AM 04/25/2021  ?  5:01 AM 04/24/2021  ? 10:24 PM  ?Last 3 Weights  ?Weight (lbs) 212 lb 15.4 oz 209 lb 14.1 oz 207 lb 14.3 oz  ?Weight (kg) 96.6 kg 95.2 kg 94.3 kg  ?   ? ?Telemetry  ?  ?Normal sinus rhythm in 70-80s- Personally Reviewed ? ?ECG  ?  ?Normal sinus rhythm, rate 76, first-degree AV block, poor R wave progression- Personally Reviewed ? ?Physical Exam  ? ?GEN: No acute distress.   ?Neck: No JVD ?Cardiac: RRR, no murmurs, rubs, or gallops.   ?Respiratory: Clear to auscultation bilaterally. ?GI: Soft, nontender, non-distended  ?MS: No edema; No deformity. ?Neuro:  Nonfocal  ?Psych: Normal affect  ? ?Labs  ?  ?High Sensitivity Troponin:   ?Recent Labs  ?Lab 04/24/21 ?1250 04/24/21 ?1718  ?TROPONINIHS 70* 67*  ? ?   ?Chemistry ?Recent Labs  ?Lab 04/24/21 ?1250 04/25/21 ?0415 04/26/21 ?0056  ?NA 134* 136 136  ?K 3.5 3.9 4.2  ?CL 98 104 106  ?CO2 '24 27 25  '$ ?GLUCOSE 119* 129* 112*  ?BUN '11 10 8  '$ ?CREATININE 0.91 0.98 0.97  ?CALCIUM 8.5* 8.6* 8.7*  ?MG 2.1  --   --   ?PROT  --  5.4* 5.2*  ?ALBUMIN  --  2.9* 2.9*  ?AST  --  28 25  ?ALT  --  63* 56*  ?ALKPHOS  --  86 78  ?BILITOT  --  0.2* 0.4  ?GFRNONAA >60 >60 >60  ?ANIONGAP '12 5 5  '$ ? ?  ?Lipids No results for input(s): CHOL, TRIG, HDL, LABVLDL, LDLCALC, CHOLHDL in the last 168 hours.  ?Hematology ?Recent Labs  ?Lab 04/24/21 ?1250 04/25/21 ?0415 04/26/21 ?0056  ?WBC  9.0 8.6 7.6  ?RBC 3.62* 2.99* 2.88*  2.80*  ?HGB 10.9* 9.0* 8.6*  ?HCT 32.3* 26.9* 25.4*  ?MCV 89.2 90.0 88.2  ?MCH 30.1 30.1 29.9  ?MCHC 33.7 33.5 33.9  ?RDW 12.9 12.9 12.8  ?PLT 339 309 320  ? ? ?Thyroid  ?Recent Labs  ?Lab 04/25/21 ?0415  ?TSH 3.261  ? ?  ?BNP ?Recent Labs  ?Lab 04/24/21 ?1250  ?BNP 364.7*  ? ?  ?DDimer No results for input(s): DDIMER in the last 168 hours.  ? ?Radiology  ?  ?DG Chest Port 1 View ? ?Result Date: 04/24/2021 ?CLINICAL DATA:  Near-syncope and shortness of breath. EXAM: PORTABLE CHEST 1 VIEW COMPARISON:  Chest radiograph 04/22/2021; CT chest 04/02/2021 FINDINGS: Of note, the left costophrenic angle is not completely included on the field of view. Postoperative changes of median sternotomy and aortic valve replacement. The heart size and mediastinal contours are within normal limits. Both lungs are clear. No pleural effusion or pneumothorax. No acute osseous abnormality. IMPRESSION: No acute cardiopulmonary abnormality. Electronically Signed   By: Ileana Roup M.D.   On: 04/24/2021 12:48  ? ?EEG adult ? ?Result  Date: 04/25/2021 ?Greta Doom, MD     04/25/2021  8:21 PM History: 66 yo M being evaluated for syncope Sedation: None Technique: This EEG was acquired with electrodes placed according to the International 10-20 electrode system (including Fp1, Fp2, F3, F4, C3, C4, P3, P4, O1, O2, T3, T4, T5, T6, A1, A2, Fz, Cz, Pz). The following electrodes were missing or displaced: none. Background: The background consists of intermixed alpha and beta activities. There is a well defined posterior dominant rhythm of 8-9 Hz that attenuates with eye opening. There is anterior shifitng of the PDR with drowsiness, but sleep is not recorded. Photic stimulation: Physiologic driving is not performed EEG Abnormalities: NOne Clinical Interpretation: This normal EEG is recorded in the waking and drowsy state. There was no seizure or seizure predisposition recorded on this study. Please note that lack of epileptiform activity on EEG does not preclude the possibility of epilepsy. Roland Rack, MD [facility] [phone] If 7pm- 7am, please page neurology on call as listed in [place].  ? ?ECHOCARDIOGRAM COMPLETE ? ?Result Date: 04/25/2021 ?   ECHOCARDIOGRAM REPORT   Patient Name:   Randall Flores. Date of Exam: 04/25/2021 Medical Rec #:  [id number]           Height:       71.0 in Accession #:    [id number]          Weight:       209.9 lb Date of Birth:  Sep 03, 1955           BSA:          2.152 m? Patient Age:    66 years            BP:           115/65 mmHg Patient Gender: M                   HR:           75 bpm. Exam Location:  Inpatient Procedure: 2D Echo, Cardiac Doppler and Color Doppler Indications:    Syncope  History:        Patient has prior history of Echocardiogram examinations, most                 recent 04/16/2021. Aortic Valve Disease. 25 mm Edwards Inspiris  aortic valve (procedure date 04/16/21).  Sonographer:    Clayton Lefort RDCS (AE) Referring Phys: [id number] [place]   1. Poor acoustic windows.? basal infeiror/inferoseptal hypokinesis. . Left ventricular ejection fraction, by estimation, is 60 to 65%. The left ventricle has normal function. There is mild left ventricular hypertrophy. Left ventricular diastolic parameters were normal.  2. Right ventricular systolic function is normal. The right ventricular size is normal.  3. The mitral valve is normal in structure. Trivial mitral valve regurgitation.  4. S/p AVR (25 mm Edwards Inspirirs valve. 04/16/21) Peak and mean gradients through the valve are 18 and 9 mm Hg respectively. . The aortic valve has been repaired/replaced. Aortic valve regurgitation is not visualized.  5. Aortic dilatation noted. There is borderline dilatation of the aortic root, measuring 39 mm. There is mild dilatation of the ascending aorta, measuring 41 mm.  6. The inferior vena cava is normal in size with greater than 50% respiratory variability, suggesting right atrial pressure of 3 mmHg. FINDINGS  Left Ventricle: Poor acoustic windows.? basal infeiror/inferoseptal hypokinesis. Left ventricular ejection fraction, by estimation, is 60 to 65%. The left ventricle has normal function. The left ventricular internal cavity size was normal in size. There  is mild left ventricular hypertrophy. Left ventricular diastolic parameters were normal. Right Ventricle: The right ventricular size is normal. Right vetricular wall thickness was not assessed. Right ventricular systolic function is normal. Left Atrium: Left atrial size was normal in size. Right Atrium: Right atrial size was normal in size. Pericardium: There is no evidence of pericardial effusion. Mitral Valve: The mitral valve is normal in structure. Trivial mitral valve regurgitation. Tricuspid Valve: The tricuspid valve is normal in structure. Tricuspid valve regurgitation is trivial. Aortic Valve: S/p AVR (25 mm Edwards Inspirirs valve. 04/16/21) Peak and mean gradients through the valve are 18 and 9 mm Hg  respectively. The aortic valve has been repaired/replaced. Aortic valve regurgitation is not visualized. Aortic valve mean gradient measures 5.0 mmHg. Aortic valve peak gradient measures 8.3 mmHg. Aortic valve area, by VT

## 2021-04-27 ENCOUNTER — Ambulatory Visit: Payer: Self-pay

## 2021-04-29 ENCOUNTER — Encounter: Payer: Medicare HMO | Admitting: Surgery

## 2021-05-01 ENCOUNTER — Ambulatory Visit (INDEPENDENT_AMBULATORY_CARE_PROVIDER_SITE_OTHER): Payer: Medicare HMO

## 2021-05-01 ENCOUNTER — Ambulatory Visit: Payer: Medicare HMO | Admitting: Medical

## 2021-05-01 ENCOUNTER — Encounter: Payer: Self-pay | Admitting: Medical

## 2021-05-01 VITALS — BP 110/62 | HR 72 | Ht 71.0 in | Wt 211.0 lb

## 2021-05-01 DIAGNOSIS — I10 Essential (primary) hypertension: Secondary | ICD-10-CM | POA: Diagnosis not present

## 2021-05-01 DIAGNOSIS — Z952 Presence of prosthetic heart valve: Secondary | ICD-10-CM

## 2021-05-01 DIAGNOSIS — I4892 Unspecified atrial flutter: Secondary | ICD-10-CM | POA: Diagnosis not present

## 2021-05-01 DIAGNOSIS — R55 Syncope and collapse: Secondary | ICD-10-CM

## 2021-05-01 NOTE — Patient Instructions (Signed)
Medication Instructions:  ?No changes at this time.  ? ?*If you need a refill on your cardiac medications before your next appointment, please call your pharmacy* ? ? ?Lab Work: ?None ? ?If you have labs (blood work) drawn today and your tests are completely normal, you will receive your results only by: ?MyChart Message (if you have MyChart) OR ?A paper copy in the mail ?If you have any lab test that is abnormal or we need to change your treatment, we will call you to review the results. ? ? ?Testing/Procedures: ?Your provider has ordered a heart monitor to wear for 14 days. This will be mailed to your home with instructions on placement. Once you have finished the time frame requested, you will return monitor in box provided. ? ? ? ? ? ?Follow-Up: ?At [facility], you and your health needs are our priority.  As part of our continuing mission to provide you with exceptional heart care, we have created designated Provider Care Teams.  These Care Teams include your primary Cardiologist (physician) and Advanced Practice Providers (APPs -  Physician Assistants and Nurse Practitioners) who all work together to provide you with the care you need, when you need it. ? ? ?Your next appointment:   ?6 week(s) ? ?The format for your next appointment:   ?In Person ? ?Provider:   ?Kate Sable, MD or Cadence Kathlen Mody, PA-C ?

## 2021-05-01 NOTE — Progress Notes (Addendum)
?Cardiology Office Note:   ? ?Date:  05/01/2021  ? ?ID:  Randall Nash., DOB 1955/08/29, MRN [id number] ? ?PCP:  Frazier Richards, MD  ?[facility] Cardiologist:  Kate Sable, MD  ?[facility] Electrophysiologist:  None  ? ?Referring MD: Frazier Richards, MD  ? ?Chief Complaint: AVR follow-up ? ?History of Present Illness:   ? ?Randall Weakland. is a 66 y.o. male with a hx of recent AVR for bicuspid aortic valve, post-op atrial flutter, HTN, HLD, hypothyroidism, prediabetes, anxiety and depression who is being seen for follow-up s/p AVR and subsequent hospitalizations.  ? ?Randall Flores underwent recent bioprosthetic AVR for bicuspid aortic valve on 04/16/21. Preoperative heart catheterization revealed no significant CAD. He was discharged in sinus rhythm on 04/20/21 with amiodarone and no anticoagulation.   ?  ?He returned to [facility] 04/22/21 after an episode of seizure-like activity. He called out for help and his sister found him unresponsive with bilateral arm spasms, foaming at the mouth, and loss of bladder control. EKG with 4:1 atrial flutter. He was started on anticoagulation. MRI wwo nonacute. He was started on keppra. Pt did not stay in the ER to complete EEG. He got home from the ER at 1AM and had another seizure-like episode in which he stood up from the car to the walker and developed a weird feeling, felt itchy, pale and went stiff (arched back). He regained consciousness after about 1 minute. Sister called Dr. Vivi Martens office who advised stop lasix and lopressor and check orthostatic vitals. Afib clinic was recommended.  ? ?Admitted again 04/24/21 for syncope, suspected from dehydration vs orthostatic hypotension. Home meds were held. Symptoms and BP improved with IVF. Echo was unremarkable.  ? ?Today, the patient has a lot to say regarding recent hospitalization, some bad and some good-this was a long discussion. He denies further syncopal episodes. BP today is good. He denies chest pain. He has  DOE and generalized fatigue. He has a sick wife at home with her mother so he is under a lot of stress at home. He is wiling to try cardiac rehab.  No LLE, orthopnea, pnd or palpitations. EKG today shows SR with first degree AV block.  ? ? ?Past Medical History:  ?Diagnosis Date  ? Anxiety   ? BPH with obstruction/lower urinary tract symptoms   ? Colon polyp   ? Multiple  ? Complication of anesthesia   ? wakes up during anesthesia  ? DDD (degenerative disc disease), lumbar   ? Diverticulitis   ? Diverticulosis   ? Elevated PSA   ? Fatty liver 01/22/2015  ? Mild, noted on [place] Abd  ? Hepatic cyst 06/08/2016  ? Left, noted on CT renal  ? Hepatic hemangioma 02/03/2015  ? Small, noted on MRI Abd  ? History of colitis   ? History of kidney stones   ? Hypercholesteremia   ? Hypertension   ? Hypothyroidism   ? Major depression   ? Moderate aortic stenosis 05/24/2017  ? Noted on ECHO  ? Near syncope 04/25/2017  ? due to stress  ? OA (osteoarthritis)   ? knees, back, hands  ? Pre-diabetes   ? PVC (premature ventricular contraction)   ? Stroke [facility]) 2015  ? TIA/right side affected, right side has improved  ? Wears dentures   ? partial upper, front  ? ? ?Past Surgical History:  ?Procedure Laterality Date  ? AORTIC VALVE REPLACEMENT N/A 04/16/2021  ? Procedure: AORTIC VALVE REPLACEMENT (AVR) USING EDWARDS  INSPIRIS AORTIC VALVE 25MM;  Surgeon: Gaye Pollack, MD;  Location: [place];  Service: Open Heart Surgery;  Laterality: N/A;  ? [place], 2001  ? [place] L5-S1  ? COLONOSCOPY    ? [place]  ? COLONOSCOPY WITH PROPOFOL N/A 01/21/2017  ? Procedure: COLONOSCOPY WITH PROPOFOL;  Surgeon: [name], MD;  Location: [place];  Service: Endoscopy;  Laterality: N/A;  ? POLYPECTOMY  01/21/2017  ? Procedure: POLYPECTOMY;  Surgeon: [name], MD;  Location: [place];  Service: Endoscopy;;  ? RIGHT HEART CATH AND CORONARY ANGIOGRAPHY N/A 03/10/2021  ? Procedure: RIGHT HEART CATH AND CORONARY ANGIOGRAPHY;   Surgeon: Nelva Bush, MD;  Location: [place] CV LAB;  Service: Cardiovascular;  Laterality: N/A;  ? TEE WITHOUT CARDIOVERSION N/A 04/16/2021  ? Procedure: TRANSESOPHAGEAL ECHOCARDIOGRAM (TEE);  Surgeon: Gaye Pollack, MD;  Location: [place];  Service: Open Heart Surgery;  Laterality: N/A;  ? TOTAL KNEE ARTHROPLASTY Right 04/07/2018  ? Procedure: TOTAL KNEE ARTHROPLASTY;  Surgeon: Earlie Server, MD;  Location: [facility];  Service: Orthopedics;  Laterality: Right;  ? ? ?Current Medications: ?Current Meds  ?Medication Sig  ? acetaminophen (TYLENOL) 500 MG tablet Take 1,000 mg by mouth every 6 (six) hours as needed for moderate pain.  ? amiodarone (PACERONE) 200 MG tablet Take 1 tablet (200 mg total) by mouth daily.  ? apixaban (ELIQUIS) 5 MG TABS tablet Take 1 tablet (5 mg total) by mouth 2 (two) times daily.  ? cholecalciferol (VITAMIN D3) 25 MCG (1000 UNIT) tablet Take 1,000 Units by mouth daily.  ? citalopram (CELEXA) 20 MG tablet Take 20 mg by mouth daily.  ? DOCUSATE SODIUM PO Take 1 capsule by mouth daily as needed (stool softener).  ? DULoxetine (CYMBALTA) 60 MG capsule Take 60 mg by mouth daily.  ? levETIRAcetam (KEPPRA) 500 MG tablet Take 1 tablet (500 mg total) by mouth 2 (two) times daily.  ? levothyroxine (SYNTHROID, LEVOTHROID) 150 MCG tablet Take 150 mcg by mouth daily before breakfast.   ? Multiple Vitamin (MULTIVITAMIN) capsule Take 1 capsule by mouth daily.  ? OVER THE COUNTER MEDICATION Take 1 Dose by mouth daily as needed (mouth pain). Salt Rinse  ? rosuvastatin (CRESTOR) 10 MG tablet Take 10 mg by mouth daily.  ? tamsulosin (FLOMAX) 0.4 MG CAPS capsule Take 1 capsule (0.4 mg total) by mouth daily.  ? traMADol (ULTRAM) 50 MG tablet Take by mouth every 6 (six) hours as needed.  ? traZODone (DESYREL) 50 MG tablet Take 1 tablet (50 mg total) by mouth at bedtime as needed for sleep.  ?  ? ?Allergies:   Codeine, Ivp dye [iodinated contrast media], Pravastatin, and Shellfish allergy  ? ?Social  History  ? ?Socioeconomic History  ? Marital status: Married  ?  Spouse name: David Rodriquez  ? Number of children: 2  ? Years of education: Not on file  ? Highest education level: Not on file  ?Occupational History  ? Not on file  ?Tobacco Use  ? Smoking status: Never  ? Smokeless tobacco: Never  ?Vaping Use  ? Vaping Use: Never used  ?Substance and Sexual Activity  ? Alcohol use: Not Currently  ?  Comment: 6 beers/week  ? Drug use: Yes  ?  Types: Marijuana  ?  Comment: had beg. of Feb. 2023  ? Sexual activity: Not on file  ?Other Topics Concern  ? Not on file  ?Social History Narrative  ? Lives at home with wife   ? ?  Social Determinants of Health  ? ?Financial Resource Strain: Not on file  ?Food Insecurity: Not on file  ?Transportation Needs: Not on file  ?Physical Activity: Not on file  ?Stress: Not on file  ?Social Connections: Not on file  ?  ? ?Family History: ?The patient's family history includes Alzheimer's disease in his mother. ? ?ROS:   ?Please see the history of present illness.    ? All other systems reviewed and are negative. ? ?EKGs/Labs/Other Studies Reviewed:   ? ?The following studies were reviewed today: ? ?Echo 04/25/21: ? 1. Poor acoustic windows.? basal infeiror/inferoseptal hypokinesis. Marland Kitchen  ?Left ventricular ejection fraction, by estimation, is 60 to 65%. The left  ?ventricle has normal function. There is mild left ventricular hypertrophy.  ?Left ventricular diastolic  ?parameters were normal.  ? 2. Right ventricular systolic function is normal. The right ventricular  ?size is normal.  ? 3. The mitral valve is normal in structure. Trivial mitral valve  ?regurgitation.  ? 4. S/p AVR (25 mm Edwards Inspirirs valve. 04/16/21) Peak and mean  ?gradients through the valve are 18 and 9 mm Hg respectively. . The aortic  ?valve has been repaired/replaced. Aortic valve regurgitation is not  ?visualized.  ? 5. Aortic dilatation noted. There is borderline dilatation of the aortic  ?root, measuring 39 mm.  There is mild dilatation of the ascending aorta,  ?measuring 41 mm.  ? 6. The inferior vena cava is normal in size with greater than 50%  ?respiratory variability, suggesting right atrial pressure of 3 mmHg. ?

## 2021-05-05 DIAGNOSIS — R55 Syncope and collapse: Secondary | ICD-10-CM

## 2021-05-20 ENCOUNTER — Ambulatory Visit: Payer: Medicare HMO

## 2021-05-20 ENCOUNTER — Encounter: Payer: Medicare HMO | Admitting: Surgery

## 2021-05-22 ENCOUNTER — Telehealth: Payer: Self-pay | Admitting: Internal Medicine

## 2021-05-22 DIAGNOSIS — I4892 Unspecified atrial flutter: Secondary | ICD-10-CM

## 2021-05-22 MED ORDER — AMIODARONE HCL 200 MG PO TABS: 200.0000 mg | ORAL_TABLET | Freq: Every day | ORAL | 0 refills | Status: DC

## 2021-05-22 MED ORDER — APIXABAN 5 MG PO TABS: 5.0000 mg | ORAL_TABLET | Freq: Two times a day (BID) | ORAL | 1 refills | Status: DC

## 2021-05-22 NOTE — Telephone Encounter (Signed)
Eliquis '5mg'$  refill request received. Patient is 66 years old, weight-95.7kg, Crea-0.97 on 04/26/2021, Diagnosis-Aflutter, and last seen by Cadence Furth on 05/01/2021. Dose is appropriate based on dosing criteria. Will send in refill to requested pharmacy.   ?

## 2021-05-22 NOTE — Addendum Note (Signed)
Addended by: Derrel Nip B on: 05/22/2021 10:28 AM ? ? Modules accepted: Orders ? ?

## 2021-05-22 NOTE — Telephone Encounter (Signed)
? ? ?*  STAT* If patient is at the pharmacy, call can be transferred to refill team. ? ? ?1. Which medications need to be refilled? (please list name of each medication and dose if known) amiodarone (PACERONE) 200 MG tablet ?apixaban (ELIQUIS) 5 MG TABS tablet ? ?2. Which pharmacy/location (including street and city if local pharmacy) is medication to be sent to? [place], [place] ? ?3. Do they need a 30 day or 90 day supply? 90 days ?

## 2021-05-22 NOTE — Telephone Encounter (Signed)
Refill request for Eliquis sent to anticoag pool.  ? ?Requested Prescriptions  ? ?Signed Prescriptions Disp Refills  ? amiodarone (PACERONE) 200 MG tablet 90 tablet 0  ?  Sig: Take 1 tablet (200 mg total) by mouth daily.  ?  Authorizing Provider: END, CHRISTOPHER  ?  Ordering User: NEWCOMER MCCLAIN, Claribel Sachs L  ? ? ? ? ?

## 2021-05-26 ENCOUNTER — Other Ambulatory Visit: Payer: Self-pay | Admitting: Surgery

## 2021-05-26 DIAGNOSIS — Z952 Presence of prosthetic heart valve: Secondary | ICD-10-CM

## 2021-05-27 ENCOUNTER — Ambulatory Visit
Admission: RE | Admit: 2021-05-27 | Discharge: 2021-05-27 | Disposition: A | Discharge status: Home / Self Care | Payer: Medicare HMO | Source: Ambulatory Visit | Attending: Surgery | Admitting: Surgery

## 2021-05-27 ENCOUNTER — Ambulatory Visit (INDEPENDENT_AMBULATORY_CARE_PROVIDER_SITE_OTHER): Payer: Self-pay | Admitting: Physician Assistant

## 2021-05-27 VITALS — BP 121/63 | HR 74 | Resp 20 | Ht 71.0 in | Wt 213.0 lb

## 2021-05-27 DIAGNOSIS — Z952 Presence of prosthetic heart valve: Secondary | ICD-10-CM

## 2021-05-27 NOTE — Patient Instructions (Addendum)
You are encouraged to enroll and participate in the outpatient cardiac rehab program beginning as soon as practical. ? ? ?You may return to driving an automobile as long as you are no longer requiring oral narcotic pain relievers during the daytime.  It would be wise to start driving only short distances during the daylight and gradually increase from there as you feel comfortable. ? ? ?Make every effort to maintain a "heart-healthy" lifestyle with regular physical exercise and adherence to a low-fat, low-carbohydrate diet.  Continue to seek regular follow-up appointments with your primary care physician and/or cardiologist. ? ? ?You may continue to gradually increase your physical activity as tolerated.  Refrain from any heavy lifting or strenuous use of your arms and shoulders until at least 8 weeks from the time of your surgery, and avoid activities that cause increased pain in your chest on the side of your surgical incision.  Otherwise you may continue to increase activities without any particular limitations.  Increase the intensity and duration of physical activity gradually. ? ?Endocarditis is a potentially serious infection of heart valves or inside lining of the heart.  It occurs more commonly in patients with diseased heart valves (such as patient's with aortic or mitral valve disease) and in patients who have undergone heart valve repair or replacement. ? ?Certain surgical and dental procedures may put you at risk, such as dental cleaning, other dental procedures, or any surgery involving the respiratory, urinary, gastrointestinal tract, gallbladder or prostate gland.  ? ?To minimize your chances for develooping endocarditis, maintain good oral health and seek prompt medical attention for any infections involving the mouth, teeth, gums, skin or urinary tract.   ? ?Always notify your doctor or dentist about your underlying heart valve condition before having any invasive procedures. You will need to take  antibiotics before certain procedures, including all routine dental cleanings or other dental procedures.  Your cardiologist or dentist should prescribe these antibiotics for you to be taken ahead of time. ? ?  ? ? ?

## 2021-05-27 NOTE — Progress Notes (Signed)
? ?   ?[place].[address] ?      [place] [address] ?            [phone]   ? ?  ? ?HPI: ?Patient returns for routine postoperative follow-up having undergone Aortic Valve Replacement on 04/16/2021.  The patient's early postoperative recovery while in the hospital progressed without complications.  Post discharge he presented to the ED on several occasions.  Once with seizure like activity during which MRI was obtained and was normal.  He did not stay for an EEG.  They recommended Keppra be started.  He suffered a syncopal episode on 04/24/2021.  He was evaluated in the ED and found to experiencing orthostatic hypotension.  His medication regimen was adjusted and he was discharged home. Since hospital discharge the patient reports he has no further seizure like activity.  He was evaluated by Cardiology at which time EKG obtained showed NSR with 1st degree AV Block..  The patient is doing much better. He is ambulating without difficulty.  He is unable to attend cardiac rehabilitation due to the cost per session.  He is asking if he can discontinue Keppra, however he has not yet followed up with Neurology.  I encouraged patient to continue taking this medication and to follow up with Neurology and discuss discontinuation with them.  He denies pain and palpitations.  His incisions are healing without evidence of infection. ? ?Current Outpatient Medications  ?Medication Sig Dispense Refill  ? acetaminophen (TYLENOL) 500 MG tablet Take 1,000 mg by mouth every 6 (six) hours as needed for moderate pain.    ? amiodarone (PACERONE) 200 MG tablet Take 1 tablet (200 mg total) by mouth daily. 90 tablet 0  ? apixaban (ELIQUIS) 5 MG TABS tablet Take 1 tablet (5 mg total) by mouth 2 (two) times daily. 180 tablet 1  ? cholecalciferol (VITAMIN D3) 25 MCG (1000 UNIT) tablet Take 1,000 Units by mouth daily.    ? citalopram (CELEXA) 20 MG tablet Take 20 mg by mouth daily.    ? DOCUSATE SODIUM PO Take 1 capsule by mouth  daily as needed (stool softener).    ? DULoxetine (CYMBALTA) 60 MG capsule Take 60 mg by mouth daily.    ? levETIRAcetam (KEPPRA) 500 MG tablet Take 1 tablet (500 mg total) by mouth 2 (two) times daily. 60 tablet 0  ? levothyroxine (SYNTHROID, LEVOTHROID) 150 MCG tablet Take 150 mcg by mouth daily before breakfast.     ? Multiple Vitamin (MULTIVITAMIN) capsule Take 1 capsule by mouth daily.    ? OVER THE COUNTER MEDICATION Take 1 Dose by mouth daily as needed (mouth pain). Salt Rinse    ? rosuvastatin (CRESTOR) 10 MG tablet Take 10 mg by mouth daily.    ? tamsulosin (FLOMAX) 0.4 MG CAPS capsule Take 1 capsule (0.4 mg total) by mouth daily. 90 capsule 3  ? traMADol (ULTRAM) 50 MG tablet Take by mouth every 6 (six) hours as needed.    ? traZODone (DESYREL) 50 MG tablet Take 1 tablet (50 mg total) by mouth at bedtime as needed for sleep. 15 tablet 0  ? ?No current facility-administered medications for this visit.  ? ? ?Physical Exam: ? ?BP 121/63 (BP Location: Right Arm, Patient Position: Sitting)   Pulse 74   Resp 20   Ht '5\' 11"'$  (1.803 m)   Wt 213 lb (96.6 kg)   SpO2 98% Comment: RA  BMI 29.71 kg/m?  ? ?Gen: no apparent distress ?Heart: RRR ?  Lungs: CTA bilaterally ?Ext: no edema ?Incisions: well healed ? ?Diagnostic Tests: ? ?CXR: looks great, sternal wires intact, no pleural effusions ? ? ?A/P: ? ?S/P AVR on 1/47/0929- had some complications post hospital discharge with seizure like activity and orthostatic hypotension.  He has been evaluated in the ED several times and ultimately was admitted on most recent presentation required admission with adjustment of BP medications. ?Seizure like activity, MRI was negative-on Keppra, follow up per Neurology ?Activity- increase ambulation as tolerated,  okay to enroll in cardiac rehabilitation program if you wish to do so, okay to drive from surgery standpoint, however with recent seizure like activity would need to obtain clearance from Neurology prior to  driving ?Endocarditis education provided ?RTC prn ? ?Ellwood Handler, PA-C ?[facility] ?[phone] ? ?

## 2021-06-03 ENCOUNTER — Ambulatory Visit
Admission: RE | Admit: 2021-06-03 | Discharge: 2021-06-03 | Disposition: A | Discharge status: Home / Self Care | Payer: Medicare HMO | Source: Ambulatory Visit | Attending: Urology | Admitting: Urology

## 2021-06-03 ENCOUNTER — Telehealth: Payer: Self-pay | Admitting: *Deleted

## 2021-06-03 DIAGNOSIS — C61 Malignant neoplasm of prostate: Secondary | ICD-10-CM

## 2021-06-03 MED ORDER — DIAZEPAM 10 MG PO TABS: 10.0000 mg | ORAL_TABLET | Freq: Once | ORAL | 0 refills | Status: AC

## 2021-06-03 MED ORDER — GADOBUTROL 1 MMOL/ML IV SOLN: 9.0000 mL | Freq: Once | INTRAVENOUS | Status: DC | PRN

## 2021-06-03 NOTE — Telephone Encounter (Signed)
Patient returned call, aware to take Valium to MRI-will call to get MRI rescheduled. Voiced understanding.  ?

## 2021-06-03 NOTE — Telephone Encounter (Signed)
Left patient VM to return call.

## 2021-06-03 NOTE — Telephone Encounter (Signed)
Pt stopped by office to let us know he couldn't do MRI this morning.  He had open heart surgery and was experiencing some claustrophobia. Would like to know if some medicine can be sent in prior to him rescheduling? ?

## 2021-06-03 NOTE — Telephone Encounter (Signed)
Valium sent to pharmacy.  Please ensure that he reschedules.  He must have a driver. ? ?Hollice Espy, MD ? ?

## 2021-06-05 ENCOUNTER — Ambulatory Visit: Payer: Medicare HMO | Admitting: Cardiology

## 2021-06-12 ENCOUNTER — Encounter: Payer: Self-pay | Admitting: Cardiology

## 2021-06-12 ENCOUNTER — Ambulatory Visit (INDEPENDENT_AMBULATORY_CARE_PROVIDER_SITE_OTHER): Payer: Medicare HMO

## 2021-06-12 ENCOUNTER — Ambulatory Visit: Payer: Medicare HMO | Admitting: Cardiology

## 2021-06-12 VITALS — BP 148/80 | HR 75 | Ht 70.5 in | Wt 215.4 lb

## 2021-06-12 DIAGNOSIS — I4892 Unspecified atrial flutter: Secondary | ICD-10-CM

## 2021-06-12 DIAGNOSIS — I10 Essential (primary) hypertension: Secondary | ICD-10-CM

## 2021-06-12 DIAGNOSIS — E782 Mixed hyperlipidemia: Secondary | ICD-10-CM | POA: Diagnosis not present

## 2021-06-12 DIAGNOSIS — Z952 Presence of prosthetic heart valve: Secondary | ICD-10-CM | POA: Diagnosis not present

## 2021-06-12 MED ORDER — ROSUVASTATIN CALCIUM 20 MG PO TABS: 20.0000 mg | ORAL_TABLET | Freq: Every day | ORAL | 3 refills | Status: AC

## 2021-06-12 MED ORDER — LISINOPRIL 5 MG PO TABS: 5.0000 mg | ORAL_TABLET | Freq: Every day | ORAL | 3 refills | Status: DC

## 2021-06-12 NOTE — Progress Notes (Signed)
Cardiology Office Note:    Date:  06/12/2021   ID:  Randall Flores., DOB May 30, 1955, MRN [id number]  PCP:  Frazier Richards, MD   [facility] Providers Cardiologist:  Kate Sable, MD     Referring MD: Frazier Richards, MD   Chief Complaint  Patient presents with   Other    6 wk f/u zio no complaints today. Meds reviewed verbally with pt.    History of Present Illness:    Randall Flores. is a 66 y.o. male with a hx of Severe AS s/p AVR bioprosthetic valve 03/2021, atrial flutter, hypertension, hyperlipidemia, anxiety who presents for follow-up.   Previously seen for symptoms of chest pain, work-up revealed severe aortic stenosis.  Underwent aortic valve replacement with bioprosthetic valve 03/2021.  Had an episode of syncope and seizure-like activity, found to be in atrial flutter.  Was managed with amiodarone and Eliquis.  History of was in the context of recent surgery.    Lisinopril and metoprolol were held for previous syncope and dehydration.  Cardiac monitor was placed after last visit to evaluate any significant arrhythmias.  Prior notes Echo 02/2021 EF 60 to 65%, severe aortic stenosis Left heart cath 02/2021 no angiographic significant CAD Lexiscan Myoview 01/2021 no evidence of ischemia  he has a strong family history of heart attacks, with his dad having heart attack in his 15s, several cousins and uncles on the dad side have had heart attacks.   Past Medical History:  Diagnosis Date   Anxiety    BPH with obstruction/lower urinary tract symptoms    Colon polyp    Multiple   Complication of anesthesia    wakes up during anesthesia   DDD (degenerative disc disease), lumbar    Diverticulitis    Diverticulosis    Elevated PSA    Fatty liver 01/22/2015   Mild, noted on [place] Abd   Hepatic cyst 06/08/2016   Left, noted on CT renal   Hepatic hemangioma 02/03/2015   Small, noted on MRI Abd   History of colitis    History of kidney stones     Hypercholesteremia    Hypertension    Hypothyroidism    Major depression    Moderate aortic stenosis 05/24/2017   Noted on ECHO   Near syncope 04/25/2017   due to stress   OA (osteoarthritis)    knees, back, hands   Pre-diabetes    PVC (premature ventricular contraction)    Stroke ([place]) 2015   TIA/right side affected, right side has improved   Wears dentures    partial upper, front    Past Surgical History:  Procedure Laterality Date   AORTIC VALVE REPLACEMENT N/A 04/16/2021   Procedure: AORTIC VALVE REPLACEMENT (AVR) USING EDWARDS INSPIRIS AORTIC VALVE 25MM;  Surgeon: Gaye Pollack, MD;  Location: [place];  Service: Open Heart Surgery;  Laterality: N/A;   [place], 2001   [place] L5-S1   COLONOSCOPY     [place]   COLONOSCOPY WITH PROPOFOL N/A 01/21/2017   Procedure: COLONOSCOPY WITH PROPOFOL;  Surgeon: [name], MD;  Location: [place];  Service: Endoscopy;  Laterality: N/A;   POLYPECTOMY  01/21/2017   Procedure: POLYPECTOMY;  Surgeon: [name], MD;  Location: [place];  Service: Endoscopy;;   RIGHT HEART CATH AND CORONARY ANGIOGRAPHY N/A 03/10/2021   Procedure: RIGHT HEART CATH AND CORONARY ANGIOGRAPHY;  Surgeon: Nelva Bush, MD;  Location: [place] [facility];  Service: Cardiovascular;  Laterality:  N/A;   TEE WITHOUT CARDIOVERSION N/A 04/16/2021   Procedure: TRANSESOPHAGEAL ECHOCARDIOGRAM (TEE);  Surgeon: Gaye Pollack, MD;  Location: [place];  Service: Open Heart Surgery;  Laterality: N/A;   TOTAL KNEE ARTHROPLASTY Right 04/07/2018   Procedure: TOTAL KNEE ARTHROPLASTY;  Surgeon: Earlie Server, MD;  Location: [facility];  Service: Orthopedics;  Laterality: Right;    Current Medications: Current Meds  Medication Sig   acetaminophen (TYLENOL) 500 MG tablet Take 1,000 mg by mouth every 6 (six) hours as needed for moderate pain.   apixaban (ELIQUIS) 5 MG TABS tablet Take 1 tablet (5 mg total) by mouth 2 (two) times daily.    cholecalciferol (VITAMIN D3) 25 MCG (1000 UNIT) tablet Take 1,000 Units by mouth daily.   citalopram (CELEXA) 20 MG tablet Take 20 mg by mouth daily.   DOCUSATE SODIUM PO Take 1 capsule by mouth daily as needed (stool softener).   DULoxetine (CYMBALTA) 60 MG capsule Take 60 mg by mouth daily.   levothyroxine (SYNTHROID, LEVOTHROID) 150 MCG tablet Take 150 mcg by mouth daily before breakfast.    lisinopril (ZESTRIL) 5 MG tablet Take 1 tablet (5 mg total) by mouth daily.   Multiple Vitamin (MULTIVITAMIN) capsule Take 1 capsule by mouth daily.   OVER THE COUNTER MEDICATION Take 1 Dose by mouth daily as needed (mouth pain). Salt Rinse   tamsulosin (FLOMAX) 0.4 MG CAPS capsule Take 1 capsule (0.4 mg total) by mouth daily.   traZODone (DESYREL) 50 MG tablet Take 1 tablet (50 mg total) by mouth at bedtime as needed for sleep.   [DISCONTINUED] amiodarone (PACERONE) 200 MG tablet Take 1 tablet (200 mg total) by mouth daily.   [DISCONTINUED] rosuvastatin (CRESTOR) 10 MG tablet Take 10 mg by mouth daily.     Allergies:   Codeine, Ivp dye [iodinated contrast media], Pravastatin, and Shellfish allergy   Social History   Socioeconomic History   Marital status: Married    Spouse name: [name]   Number of children: 2   Years of education: Not on file   Highest education level: Not on file  Occupational History   Not on file  Tobacco Use   Smoking status: Never   Smokeless tobacco: Never  Vaping Use   Vaping Use: Never used  Substance and Sexual Activity   Alcohol use: Not Currently    Comment: 6 beers/week   Drug use: Yes    Types: Marijuana    Comment: had beg. of Feb. 2023   Sexual activity: Not on file  Other Topics Concern   Not on file  Social History Narrative   Lives at home with wife    Social Determinants of Health   Financial Resource Strain: Not on file  Food Insecurity: Not on file  Transportation Needs: Not on file  Physical Activity: Not on file  Stress: Not on  file  Social Connections: Not on file     Family History: The patient's family history includes Alzheimer's disease in his mother.  ROS:   Please see the history of present illness.     All other systems reviewed and are negative.  EKGs/Labs/Other Studies Reviewed:    The following studies were reviewed today:   EKG:  EKG is ordered today.  EKG shows normal sinus rhythm, possible old inferior infarct  Recent Labs: 04/24/2021: B Natriuretic Peptide 364.7; Magnesium 2.1 04/25/2021: TSH 3.261 04/26/2021: ALT 56; BUN 8; Creatinine, Ser 0.97; Hemoglobin 8.6; Platelets 320; Potassium 4.2; Sodium 136  Recent Lipid Panel  Component Value Date/Time   CHOL 175 06/28/2013 0624   TRIG 214 (H) 06/28/2013 0624   HDL 27 (L) 06/28/2013 0624   VLDL 43 (H) 06/28/2013 0624   LDLCALC 105 (H) 06/28/2013 0624   Outside lipid panel 01/28/2021 total cholesterol 137, HDL 37, LDL 78, triglycerides 124.  Risk Assessment/Calculations:         Physical Exam:    VS:  BP (!) 148/80 (BP Location: Left Arm, Patient Position: Sitting, Cuff Size: Normal)   Pulse 75   Ht 5' 10.5" (1.791 m)   Wt 215 lb 6 oz (97.7 kg)   SpO2 96%   BMI 30.47 kg/m     Wt Readings from Last 3 Encounters:  06/12/21 215 lb 6 oz (97.7 kg)  05/27/21 213 lb (96.6 kg)  05/01/21 211 lb (95.7 kg)     GEN:  Well nourished, well developed in no acute distress HEENT: Normal NECK: No JVD; No carotid bruits CARDIAC: RRR, no murmur RESPIRATORY:  Clear to auscultation without rales, wheezing or rhonchi  ABDOMEN: Soft, non-tender, non-distended MUSCULOSKELETAL:  No edema; No deformity  SKIN: Warm and dry NEUROLOGIC:  Alert and oriented x 3 PSYCHIATRIC:  Normal affect   ASSESSMENT:    1. S/P AVR   2. Primary hypertension   3. Mixed hyperlipidemia   4. Atrial flutter, unspecified type ([place])      PLAN:    In order of problems listed above:  Severe aortic stenosis s/p AVR bioprosthetic valve 03/2021.  No murmur noted on  exam, continue to monitor serially with echocardiograms. Hypertension, BP elevated.  Start lisinopril 5 mg daily. Hyperlipidemia, cholesterol not controlled.  Increase Crestor to 20 mg daily. Atrial fib/flutter in the context of surgery.  Has been on Eliquis and amiodarone for about 6 weeks now.  Patient has thyroid dysfunction.  Stop amiodarone, place cardiac monitor.  If no arrhythmias noted, plan to stop Eliquis at follow-up visit.  Follow-up in 6 weeks     Medication Adjustments/Labs and Tests Ordered: Current medicines are reviewed at length with the patient today.  Concerns regarding medicines are outlined above.  Orders Placed This Encounter  Procedures   LONG TERM MONITOR (3-14 DAYS)   Meds ordered this encounter  Medications   lisinopril (ZESTRIL) 5 MG tablet    Sig: Take 1 tablet (5 mg total) by mouth daily.    Dispense:  30 tablet    Refill:  3   rosuvastatin (CRESTOR) 20 MG tablet    Sig: Take 1 tablet (20 mg total) by mouth daily.    Dispense:  30 tablet    Refill:  3    Patient Instructions  Medication Instructions:   Your physician has recommended you make the following change in your medication:    START Lisinopril 5 MG once a day.  2.    STOP taking Amiodarone.  3.    INCREASE your Rosuvastatin to 20 MG once a day.   *If you need a refill on your cardiac medications before your next appointment, please call your pharmacy*   Lab Work:  None ordered  If you have labs (blood work) drawn today and your tests are completely normal, you will receive your results only by: [place] (if you have MyChart) OR A paper copy in the mail If you have any lab test that is abnormal or we need to change your treatment, we will call you to review the results.   Testing/Procedures:  Your physician has recommended that you wear  a Zio XT monitor for 2 weeks. This will be mailed to your home address in 4-5 business days.   This monitor is a medical device that  records the heart's electrical activity. Doctors most often use these monitors to diagnose arrhythmias. Arrhythmias are problems with the speed or rhythm of the heartbeat. The monitor is a small device applied to your chest. You can wear one while you do your normal daily activities. While wearing this monitor if you have any symptoms to push the button and record what you felt. Once you have worn this monitor for the period of time provider prescribed (Usually 14 days), you will return the monitor device in the postage paid box. Once it is returned they will download the data collected and provide [place] with a report which the provider will then review and we will call you with those results. Important tips:  Avoid showering during the first 24 hours of wearing the monitor. Avoid excessive sweating to help maximize wear time. Do not submerge the device, no hot tubs, and no swimming pools. Keep any lotions or oils away from the patch. After 24 hours you may shower with the patch on. Take brief showers with your back facing the shower head.  Do not remove patch once it has been placed because that will interrupt data and decrease adhesive wear time. Push the button when you have any symptoms and write down what you were feeling. Once you have completed wearing your monitor, remove and place into box which has postage paid and place in your outgoing mailbox.  If for some reason you have misplaced your box then call our office and we can provide another box and/or mail it off for you.    Follow-Up: At [facility], you and your health needs are our priority.  As part of our continuing mission to provide you with exceptional heart care, we have created designated Provider Care Teams.  These Care Teams include your primary Cardiologist (physician) and Advanced Practice Providers (APPs -  Physician Assistants and Nurse Practitioners) who all work together to provide you with the care you need, when you need  it.  We recommend signing up for the patient portal called "MyChart".  Sign up information is provided on this After Visit Summary.  MyChart is used to connect with patients for Virtual Visits (Telemedicine).  Patients are able to view lab/test results, encounter notes, upcoming appointments, etc.  Non-urgent messages can be sent to your provider as well.   To learn more about what you can do with MyChart, go to [email or web address].    Your next appointment:   6 week(s)  The format for your next appointment:   In Person  Provider:   Kate Sable, MD    Other Instructions   Important Information About Sugar         Signed, Kate Sable, MD  06/12/2021 12:39 PM    [place]

## 2021-06-12 NOTE — Patient Instructions (Signed)
Medication Instructions:   Your physician has recommended you make the following change in your medication:    START Lisinopril 5 MG once a day.  2.    STOP taking Amiodarone.  3.    INCREASE your Rosuvastatin to 20 MG once a day.   *If you need a refill on your cardiac medications before your next appointment, please call your pharmacy*   Lab Work:  None ordered  If you have labs (blood work) drawn today and your tests are completely normal, you will receive your results only by: [place] (if you have MyChart) OR A paper copy in the mail If you have any lab test that is abnormal or we need to change your treatment, we will call you to review the results.   Testing/Procedures:  Your physician has recommended that you wear a Zio XT monitor for 2 weeks. This will be mailed to your home address in 4-5 business days.   This monitor is a medical device that records the heart's electrical activity. Doctors most often use these monitors to diagnose arrhythmias. Arrhythmias are problems with the speed or rhythm of the heartbeat. The monitor is a small device applied to your chest. You can wear one while you do your normal daily activities. While wearing this monitor if you have any symptoms to push the button and record what you felt. Once you have worn this monitor for the period of time provider prescribed (Usually 14 days), you will return the monitor device in the postage paid box. Once it is returned they will download the data collected and provide [place] with a report which the provider will then review and we will call you with those results. Important tips:  Avoid showering during the first 24 hours of wearing the monitor. Avoid excessive sweating to help maximize wear time. Do not submerge the device, no hot tubs, and no swimming pools. Keep any lotions or oils away from the patch. After 24 hours you may shower with the patch on. Take brief showers with your back facing the  shower head.  Do not remove patch once it has been placed because that will interrupt data and decrease adhesive wear time. Push the button when you have any symptoms and write down what you were feeling. Once you have completed wearing your monitor, remove and place into box which has postage paid and place in your outgoing mailbox.  If for some reason you have misplaced your box then call our office and we can provide another box and/or mail it off for you.    Follow-Up: At [facility], you and your health needs are our priority.  As part of our continuing mission to provide you with exceptional heart care, we have created designated Provider Care Teams.  These Care Teams include your primary Cardiologist (physician) and Advanced Practice Providers (APPs -  Physician Assistants and Nurse Practitioners) who all work together to provide you with the care you need, when you need it.  We recommend signing up for the patient portal called "MyChart".  Sign up information is provided on this After Visit Summary.  MyChart is used to connect with patients for Virtual Visits (Telemedicine).  Patients are able to view lab/test results, encounter notes, upcoming appointments, etc.  Non-urgent messages can be sent to your provider as well.   To learn more about what you can do with MyChart, go to [email or web address].    Your next appointment:   6 week(s)  The format for  your next appointment:   In Person  Provider:   Kate Sable, MD    Other Instructions   Important Information About [place]

## 2021-06-15 ENCOUNTER — Ambulatory Visit
Admission: RE | Admit: 2021-06-15 | Discharge: 2021-06-15 | Disposition: A | Discharge status: Home / Self Care | Payer: Medicare HMO | Source: Ambulatory Visit | Attending: Urology | Admitting: Urology

## 2021-06-15 DIAGNOSIS — C61 Malignant neoplasm of prostate: Secondary | ICD-10-CM | POA: Diagnosis present

## 2021-06-15 MED ORDER — GADOBUTROL 1 MMOL/ML IV SOLN
9.0000 mL | Freq: Once | INTRAVENOUS | Status: AC | PRN
  Administered 2021-06-15: 9 mL via INTRAVENOUS

## 2021-06-17 DIAGNOSIS — I4892 Unspecified atrial flutter: Secondary | ICD-10-CM

## 2021-06-24 ENCOUNTER — Telehealth: Payer: Self-pay

## 2021-06-24 DIAGNOSIS — C61 Malignant neoplasm of prostate: Secondary | ICD-10-CM

## 2021-06-24 NOTE — Telephone Encounter (Signed)
Pt called looking for MRI results

## 2021-06-24 NOTE — Telephone Encounter (Signed)
Prostate MRI was fairly equivocal.  There is 1 lesion but this did not appear to be high-grade.  There is nothing outside of the prostate or no enlarged lymph nodes suggestive of aggressive disease.  Given all of his social stressors currently, lets have him follow-up in 6 months with another PSA.  We can decide whether or not to biopsy this area and down the road depending on what his PSA does.  Hollice Espy, MD

## 2021-06-26 NOTE — Telephone Encounter (Signed)
Patient informed, voiced understanding.   Scheduled lab appt

## 2021-07-09 ENCOUNTER — Encounter: Payer: Self-pay | Admitting: Cardiology

## 2021-07-19 ENCOUNTER — Inpatient Hospital Stay
Admission: EM | Admit: 2021-07-19 | Discharge: 2021-07-22 | DRG: 872 | Disposition: A | Discharge status: Home / Self Care | Payer: Medicare HMO | Attending: Internal Medicine | Admitting: Internal Medicine

## 2021-07-19 ENCOUNTER — Emergency Department: Payer: Medicare HMO

## 2021-07-19 DIAGNOSIS — N401 Enlarged prostate with lower urinary tract symptoms: Secondary | ICD-10-CM | POA: Diagnosis present

## 2021-07-19 DIAGNOSIS — K559 Vascular disorder of intestine, unspecified: Secondary | ICD-10-CM | POA: Diagnosis present

## 2021-07-19 DIAGNOSIS — N179 Acute kidney failure, unspecified: Secondary | ICD-10-CM | POA: Diagnosis present

## 2021-07-19 DIAGNOSIS — I480 Paroxysmal atrial fibrillation: Secondary | ICD-10-CM | POA: Diagnosis present

## 2021-07-19 DIAGNOSIS — K760 Fatty (change of) liver, not elsewhere classified: Secondary | ICD-10-CM | POA: Diagnosis present

## 2021-07-19 DIAGNOSIS — I440 Atrioventricular block, first degree: Secondary | ICD-10-CM | POA: Diagnosis present

## 2021-07-19 DIAGNOSIS — R531 Weakness: Secondary | ICD-10-CM | POA: Diagnosis not present

## 2021-07-19 DIAGNOSIS — F419 Anxiety disorder, unspecified: Secondary | ICD-10-CM | POA: Diagnosis present

## 2021-07-19 DIAGNOSIS — N138 Other obstructive and reflux uropathy: Secondary | ICD-10-CM | POA: Diagnosis present

## 2021-07-19 DIAGNOSIS — Z79890 Hormone replacement therapy (postmenopausal): Secondary | ICD-10-CM

## 2021-07-19 DIAGNOSIS — I10 Essential (primary) hypertension: Secondary | ICD-10-CM | POA: Diagnosis present

## 2021-07-19 DIAGNOSIS — K5792 Diverticulitis of intestine, part unspecified, without perforation or abscess without bleeding: Principal | ICD-10-CM

## 2021-07-19 DIAGNOSIS — Z87442 Personal history of urinary calculi: Secondary | ICD-10-CM | POA: Diagnosis not present

## 2021-07-19 DIAGNOSIS — Z79899 Other long term (current) drug therapy: Secondary | ICD-10-CM

## 2021-07-19 DIAGNOSIS — E039 Hypothyroidism, unspecified: Secondary | ICD-10-CM | POA: Diagnosis present

## 2021-07-19 DIAGNOSIS — A419 Sepsis, unspecified organism: Principal | ICD-10-CM | POA: Diagnosis present

## 2021-07-19 DIAGNOSIS — Z8673 Personal history of transient ischemic attack (TIA), and cerebral infarction without residual deficits: Secondary | ICD-10-CM | POA: Diagnosis not present

## 2021-07-19 DIAGNOSIS — I4892 Unspecified atrial flutter: Secondary | ICD-10-CM | POA: Diagnosis present

## 2021-07-19 DIAGNOSIS — E7800 Pure hypercholesterolemia, unspecified: Secondary | ICD-10-CM | POA: Diagnosis present

## 2021-07-19 DIAGNOSIS — K922 Gastrointestinal hemorrhage, unspecified: Secondary | ICD-10-CM | POA: Diagnosis not present

## 2021-07-19 DIAGNOSIS — R109 Unspecified abdominal pain: Secondary | ICD-10-CM

## 2021-07-19 DIAGNOSIS — I248 Other forms of acute ischemic heart disease: Secondary | ICD-10-CM | POA: Diagnosis present

## 2021-07-19 DIAGNOSIS — K5730 Diverticulosis of large intestine without perforation or abscess without bleeding: Secondary | ICD-10-CM | POA: Diagnosis present

## 2021-07-19 DIAGNOSIS — R6520 Severe sepsis without septic shock: Secondary | ICD-10-CM | POA: Diagnosis present

## 2021-07-19 DIAGNOSIS — K633 Ulcer of intestine: Secondary | ICD-10-CM | POA: Diagnosis present

## 2021-07-19 DIAGNOSIS — R778 Other specified abnormalities of plasma proteins: Secondary | ICD-10-CM | POA: Diagnosis present

## 2021-07-19 DIAGNOSIS — Z96651 Presence of right artificial knee joint: Secondary | ICD-10-CM | POA: Diagnosis present

## 2021-07-19 DIAGNOSIS — Z885 Allergy status to narcotic agent status: Secondary | ICD-10-CM

## 2021-07-19 DIAGNOSIS — Z952 Presence of prosthetic heart valve: Secondary | ICD-10-CM

## 2021-07-19 DIAGNOSIS — Z820 Family history of epilepsy and other diseases of the nervous system: Secondary | ICD-10-CM

## 2021-07-19 DIAGNOSIS — K625 Hemorrhage of anus and rectum: Secondary | ICD-10-CM | POA: Diagnosis present

## 2021-07-19 DIAGNOSIS — Z86010 Personal history of colonic polyps: Secondary | ICD-10-CM

## 2021-07-19 DIAGNOSIS — F329 Major depressive disorder, single episode, unspecified: Secondary | ICD-10-CM | POA: Diagnosis present

## 2021-07-19 DIAGNOSIS — R7989 Other specified abnormal findings of blood chemistry: Secondary | ICD-10-CM | POA: Diagnosis present

## 2021-07-19 DIAGNOSIS — Z888 Allergy status to other drugs, medicaments and biological substances status: Secondary | ICD-10-CM | POA: Diagnosis not present

## 2021-07-19 DIAGNOSIS — Z7901 Long term (current) use of anticoagulants: Secondary | ICD-10-CM

## 2021-07-19 DIAGNOSIS — K921 Melena: Secondary | ICD-10-CM | POA: Diagnosis not present

## 2021-07-19 LAB — URINALYSIS, COMPLETE (UACMP) WITH MICROSCOPIC
Bacteria, UA: NONE SEEN
Bilirubin Urine: NEGATIVE
Glucose, UA: NEGATIVE mg/dL
Ketones, ur: NEGATIVE mg/dL
Nitrite: NEGATIVE
Protein, ur: 100 mg/dL — AB
Specific Gravity, Urine: 1.01 (ref 1.005–1.030)
WBC, UA: >50 WBC/hpf — ABNORMAL HIGH (ref 0–5)
pH: 5 (ref 5.0–8.0)

## 2021-07-19 LAB — CBC WITH DIFFERENTIAL/PLATELET
Abs Immature Granulocytes: 0.06 10*3/uL (ref 0.00–0.07)
Abs Immature Granulocytes: 0.09 10*3/uL — ABNORMAL HIGH (ref 0.00–0.07)
Basophils Absolute: 0.1 10*3/uL (ref 0.0–0.1)
Basophils Absolute: 0.1 10*3/uL (ref 0.0–0.1)
Basophils Relative: 0 %
Basophils Relative: 0 %
Eosinophils Absolute: 0.2 10*3/uL (ref 0.0–0.5)
Eosinophils Absolute: 0 10*3/uL (ref 0.0–0.5)
Eosinophils Relative: 1 %
Eosinophils Relative: 0 %
HCT: 48.4 % (ref 39.0–52.0)
HCT: 40.6 % (ref 39.0–52.0)
Hemoglobin: 15.7 g/dL (ref 13.0–17.0)
Hemoglobin: 13.2 g/dL (ref 13.0–17.0)
Immature Granulocytes: 0 %
Immature Granulocytes: 1 %
Lymphocytes Relative: 8 %
Lymphocytes Relative: 2 %
Lymphs Abs: 1.3 10*3/uL (ref 0.7–4.0)
Lymphs Abs: 0.4 10*3/uL — ABNORMAL LOW (ref 0.7–4.0)
MCH: 28.3 pg (ref 26.0–34.0)
MCH: 27.8 pg (ref 26.0–34.0)
MCHC: 32.4 g/dL (ref 30.0–36.0)
MCHC: 32.5 g/dL (ref 30.0–36.0)
MCV: 87.4 fL (ref 80.0–100.0)
MCV: 85.7 fL (ref 80.0–100.0)
Monocytes Absolute: 0.8 10*3/uL (ref 0.1–1.0)
Monocytes Absolute: 1.3 10*3/uL — ABNORMAL HIGH (ref 0.1–1.0)
Monocytes Relative: 5 %
Monocytes Relative: 8 %
Neutro Abs: 13.9 10*3/uL — ABNORMAL HIGH (ref 1.7–7.7)
Neutro Abs: 15.5 10*3/uL — ABNORMAL HIGH (ref 1.7–7.7)
Neutrophils Relative %: 86 %
Neutrophils Relative %: 89 %
Platelets: 292 10*3/uL (ref 150–400)
Platelets: 224 10*3/uL (ref 150–400)
RBC: 5.54 MIL/uL (ref 4.22–5.81)
RBC: 4.74 MIL/uL (ref 4.22–5.81)
RDW: 13.4 % (ref 11.5–15.5)
RDW: 13.4 % (ref 11.5–15.5)
WBC: 16.3 10*3/uL — ABNORMAL HIGH (ref 4.0–10.5)
WBC: 17.4 10*3/uL — ABNORMAL HIGH (ref 4.0–10.5)
nRBC: 0 % (ref 0.0–0.2)
nRBC: 0 % (ref 0.0–0.2)

## 2021-07-19 LAB — COMPREHENSIVE METABOLIC PANEL
ALT: 22 U/L (ref 0–44)
AST: 32 U/L (ref 15–41)
Albumin: 4.9 g/dL (ref 3.5–5.0)
Alkaline Phosphatase: 84 U/L (ref 38–126)
Anion gap: 13 (ref 5–15)
BUN: 17 mg/dL (ref 8–23)
CO2: 20 mmol/L — ABNORMAL LOW (ref 22–32)
Calcium: 10.6 mg/dL — ABNORMAL HIGH (ref 8.9–10.3)
Chloride: 102 mmol/L (ref 98–111)
Creatinine, Ser: 1.92 mg/dL — ABNORMAL HIGH (ref 0.61–1.24)
GFR, Estimated: 38 mL/min — ABNORMAL LOW (ref >60)
Glucose, Bld: 178 mg/dL — ABNORMAL HIGH (ref 70–99)
Potassium: 3.5 mmol/L (ref 3.5–5.1)
Sodium: 135 mmol/L (ref 135–145)
Total Bilirubin: 0.7 mg/dL (ref 0.3–1.2)
Total Protein: 8 g/dL (ref 6.5–8.1)

## 2021-07-19 LAB — BASIC METABOLIC PANEL
Anion gap: 9 (ref 5–15)
BUN: 19 mg/dL (ref 8–23)
CO2: 21 mmol/L — ABNORMAL LOW (ref 22–32)
Calcium: 9.4 mg/dL (ref 8.9–10.3)
Chloride: 107 mmol/L (ref 98–111)
Creatinine, Ser: 1.77 mg/dL — ABNORMAL HIGH (ref 0.61–1.24)
GFR, Estimated: 42 mL/min — ABNORMAL LOW (ref >60)
Glucose, Bld: 159 mg/dL — ABNORMAL HIGH (ref 70–99)
Potassium: 4.1 mmol/L (ref 3.5–5.1)
Sodium: 137 mmol/L (ref 135–145)

## 2021-07-19 LAB — PROTIME-INR
INR: 1.1 (ref 0.8–1.2)
Prothrombin Time: 14.1 seconds (ref 11.4–15.2)

## 2021-07-19 LAB — APTT: aPTT: 26 seconds (ref 24–36)

## 2021-07-19 LAB — LACTIC ACID, PLASMA
Lactic Acid, Venous: 4.8 mmol/L (ref 0.5–1.9)
Lactic Acid, Venous: 3.2 mmol/L (ref 0.5–1.9)

## 2021-07-19 LAB — TROPONIN I (HIGH SENSITIVITY)
Troponin I (High Sensitivity): 22 ng/L — ABNORMAL HIGH (ref <18)
Troponin I (High Sensitivity): 52 ng/L — ABNORMAL HIGH (ref <18)

## 2021-07-19 MED ORDER — PANTOPRAZOLE INFUSION (NEW) - SIMPLE MED
8.0000 mg/h | INTRAVENOUS | Status: DC
  Administered 2021-07-19 – 2021-07-20 (×2): 8 mg/h via INTRAVENOUS
  Filled 2021-07-19 (×2): qty 100

## 2021-07-19 MED ORDER — PANTOPRAZOLE SODIUM 40 MG IV SOLR: 40.0000 mg | Freq: Two times a day (BID) | INTRAVENOUS | Status: DC

## 2021-07-19 MED ORDER — DIPHENHYDRAMINE HCL 50 MG PO CAPS
50.0000 mg | ORAL_CAPSULE | Freq: Once | ORAL | Status: AC
  Filled 2021-07-19: qty 1

## 2021-07-19 MED ORDER — VANCOMYCIN HCL IN DEXTROSE 1-5 GM/200ML-% IV SOLN
1000.0000 mg | Freq: Once | INTRAVENOUS | Status: AC
  Administered 2021-07-19: 1000 mg via INTRAVENOUS
  Filled 2021-07-19: qty 200

## 2021-07-19 MED ORDER — LACTATED RINGERS IV BOLUS
1000.0000 mL | Freq: Once | INTRAVENOUS | Status: AC
  Administered 2021-07-19: 1000 mL via INTRAVENOUS

## 2021-07-19 MED ORDER — METRONIDAZOLE 500 MG/100ML IV SOLN
500.0000 mg | Freq: Two times a day (BID) | INTRAVENOUS | Status: DC
  Administered 2021-07-20 – 2021-07-22 (×5): 500 mg via INTRAVENOUS
  Filled 2021-07-19 (×5): qty 100

## 2021-07-19 MED ORDER — ONDANSETRON HCL 4 MG/2ML IJ SOLN
4.0000 mg | Freq: Four times a day (QID) | INTRAMUSCULAR | Status: DC | PRN
  Administered 2021-07-20: 4 mg via INTRAVENOUS
  Filled 2021-07-19: qty 2

## 2021-07-19 MED ORDER — LACTATED RINGERS IV SOLN: INTRAVENOUS | Status: AC

## 2021-07-19 MED ORDER — SODIUM CHLORIDE 0.9 % IV SOLN
2.0000 g | Freq: Once | INTRAVENOUS | Status: AC
  Administered 2021-07-19: 2 g via INTRAVENOUS

## 2021-07-19 MED ORDER — FENTANYL CITRATE PF 50 MCG/ML IJ SOSY
50.0000 ug | PREFILLED_SYRINGE | INTRAMUSCULAR | Status: DC | PRN
  Administered 2021-07-20 (×2): 50 ug via INTRAVENOUS
  Filled 2021-07-19 (×2): qty 1

## 2021-07-19 MED ORDER — ACETAMINOPHEN 500 MG PO TABS
1000.0000 mg | ORAL_TABLET | Freq: Once | ORAL | Status: AC
  Administered 2021-07-19: 1000 mg via ORAL
  Filled 2021-07-19: qty 2

## 2021-07-19 MED ORDER — METRONIDAZOLE 500 MG/100ML IV SOLN
500.0000 mg | Freq: Once | INTRAVENOUS | Status: AC
  Administered 2021-07-19: 500 mg via INTRAVENOUS
  Filled 2021-07-19: qty 100

## 2021-07-19 MED ORDER — DIPHENHYDRAMINE HCL 50 MG/ML IJ SOLN
50.0000 mg | Freq: Once | INTRAMUSCULAR | Status: AC
  Administered 2021-07-20: 50 mg via INTRAVENOUS
  Filled 2021-07-19: qty 1

## 2021-07-19 MED ORDER — PANTOPRAZOLE 80MG IVPB - SIMPLE MED
80.0000 mg | Freq: Once | INTRAVENOUS | Status: AC
  Administered 2021-07-19: 80 mg via INTRAVENOUS
  Filled 2021-07-19: qty 100

## 2021-07-19 MED ORDER — ONDANSETRON HCL 4 MG PO TABS: 4.0000 mg | ORAL_TABLET | Freq: Four times a day (QID) | ORAL | Status: DC | PRN

## 2021-07-19 MED ORDER — FENTANYL CITRATE PF 50 MCG/ML IJ SOSY
50.0000 ug | PREFILLED_SYRINGE | Freq: Once | INTRAMUSCULAR | Status: AC
  Administered 2021-07-19: 50 ug via INTRAVENOUS
  Filled 2021-07-19: qty 1

## 2021-07-19 MED ORDER — ACETAMINOPHEN 325 MG PO TABS: 650.0000 mg | ORAL_TABLET | Freq: Four times a day (QID) | ORAL | Status: DC | PRN

## 2021-07-19 MED ORDER — METHYLPREDNISOLONE SODIUM SUCC 40 MG IJ SOLR
40.0000 mg | Freq: Once | INTRAMUSCULAR | Status: AC
  Administered 2021-07-20: 40 mg via INTRAVENOUS
  Filled 2021-07-19: qty 1

## 2021-07-19 MED ORDER — ACETAMINOPHEN 650 MG RE SUPP: 650.0000 mg | Freq: Four times a day (QID) | RECTAL | Status: DC | PRN

## 2021-07-19 MED ORDER — HYDRALAZINE HCL 20 MG/ML IJ SOLN
5.0000 mg | INTRAMUSCULAR | Status: DC | PRN
  Administered 2021-07-20 (×2): 5 mg via INTRAVENOUS
  Filled 2021-07-19 (×2): qty 1

## 2021-07-19 NOTE — Hospital Course (Addendum)
66 year old man with recent aortic valve replacement, postoperative atrial fibrillation, hypertension, hypothyroidism.  He presented with abdominal pain and diarrhea with blood.  CT scan showing progressive descending and sigmoid colitis.  No major bleeding.  Flexible sigmoidoscopy was done on 07/20/2021 which showed blood in the rectum diverticulosis and diffuse inflammation found in the sigmoid colon consistent with possible ischemic colitis versus inflammatory colitis.  Biopsies sent.  6/28: Biopsy results with concern of ischemic colitis.  Discussed with GI and they are suggesting conservative management as there is no necrosis.  Antibiotics switched to Augmentin to complete the course.  Patient was requesting discharge as he is taking care of his sick wife.  Patient is being discharged on current medications and will follow-up with his gastroenterologist as an outpatient for further recommendations.

## 2021-07-19 NOTE — Assessment & Plan Note (Deleted)
Patient is in sinus rhythm currently. EKG shows: Sinus tach 105 PR interval 175 QTc 497.

## 2021-07-19 NOTE — Assessment & Plan Note (Addendum)
Secondary to GI bleed and sepsis

## 2021-07-20 ENCOUNTER — Inpatient Hospital Stay: Payer: Medicare HMO | Admitting: Anesthesiology

## 2021-07-20 ENCOUNTER — Inpatient Hospital Stay: Payer: Medicare HMO

## 2021-07-20 ENCOUNTER — Encounter: Payer: Self-pay | Admitting: Internal Medicine

## 2021-07-20 ENCOUNTER — Other Ambulatory Visit: Payer: Self-pay

## 2021-07-20 ENCOUNTER — Encounter
Admission: EM | Disposition: A | Discharge status: Home / Self Care | Payer: Self-pay | Source: Home / Self Care | Attending: Internal Medicine

## 2021-07-20 DIAGNOSIS — K921 Melena: Secondary | ICD-10-CM | POA: Diagnosis not present

## 2021-07-20 DIAGNOSIS — E039 Hypothyroidism, unspecified: Secondary | ICD-10-CM

## 2021-07-20 DIAGNOSIS — R6520 Severe sepsis without septic shock: Secondary | ICD-10-CM

## 2021-07-20 DIAGNOSIS — K922 Gastrointestinal hemorrhage, unspecified: Secondary | ICD-10-CM

## 2021-07-20 DIAGNOSIS — Z952 Presence of prosthetic heart valve: Secondary | ICD-10-CM

## 2021-07-20 DIAGNOSIS — K559 Vascular disorder of intestine, unspecified: Secondary | ICD-10-CM | POA: Diagnosis not present

## 2021-07-20 DIAGNOSIS — I480 Paroxysmal atrial fibrillation: Secondary | ICD-10-CM

## 2021-07-20 DIAGNOSIS — A419 Sepsis, unspecified organism: Secondary | ICD-10-CM | POA: Diagnosis not present

## 2021-07-20 DIAGNOSIS — R531 Weakness: Secondary | ICD-10-CM | POA: Diagnosis not present

## 2021-07-20 DIAGNOSIS — R7989 Other specified abnormal findings of blood chemistry: Secondary | ICD-10-CM | POA: Diagnosis present

## 2021-07-20 DIAGNOSIS — R778 Other specified abnormalities of plasma proteins: Secondary | ICD-10-CM | POA: Diagnosis present

## 2021-07-20 DIAGNOSIS — I10 Essential (primary) hypertension: Secondary | ICD-10-CM

## 2021-07-20 HISTORY — PX: FLEXIBLE SIGMOIDOSCOPY: SHX5431

## 2021-07-20 LAB — COMPREHENSIVE METABOLIC PANEL
ALT: 19 U/L (ref 0–44)
AST: 25 U/L (ref 15–41)
Albumin: 3.6 g/dL (ref 3.5–5.0)
Alkaline Phosphatase: 58 U/L (ref 38–126)
Anion gap: 8 (ref 5–15)
BUN: 21 mg/dL (ref 8–23)
CO2: 21 mmol/L — ABNORMAL LOW (ref 22–32)
Calcium: 9.2 mg/dL (ref 8.9–10.3)
Chloride: 105 mmol/L (ref 98–111)
Creatinine, Ser: 1.4 mg/dL — ABNORMAL HIGH (ref 0.61–1.24)
GFR, Estimated: 56 mL/min — ABNORMAL LOW (ref >60)
Glucose, Bld: 155 mg/dL — ABNORMAL HIGH (ref 70–99)
Potassium: 4.1 mmol/L (ref 3.5–5.1)
Sodium: 134 mmol/L — ABNORMAL LOW (ref 135–145)
Total Bilirubin: 0.6 mg/dL (ref 0.3–1.2)
Total Protein: 6.2 g/dL — ABNORMAL LOW (ref 6.5–8.1)

## 2021-07-20 LAB — TYPE AND SCREEN
ABO/RH(D): A POS
Antibody Screen: NEGATIVE

## 2021-07-20 LAB — C DIFFICILE QUICK SCREEN W PCR REFLEX
C Diff antigen: NEGATIVE
C Diff interpretation: NOT DETECTED
C Diff toxin: NEGATIVE

## 2021-07-20 LAB — CBC
HCT: 38.5 % — ABNORMAL LOW (ref 39.0–52.0)
Hemoglobin: 12.9 g/dL — ABNORMAL LOW (ref 13.0–17.0)
MCH: 28.2 pg (ref 26.0–34.0)
MCHC: 33.5 g/dL (ref 30.0–36.0)
MCV: 84.2 fL (ref 80.0–100.0)
Platelets: 207 10*3/uL (ref 150–400)
RBC: 4.57 MIL/uL (ref 4.22–5.81)
RDW: 13.5 % (ref 11.5–15.5)
WBC: 15.1 10*3/uL — ABNORMAL HIGH (ref 4.0–10.5)
nRBC: 0 % (ref 0.0–0.2)

## 2021-07-20 LAB — GASTROINTESTINAL PANEL BY PCR, STOOL (REPLACES STOOL CULTURE)

## 2021-07-20 LAB — GLUCOSE, CAPILLARY: Glucose-Capillary: 162 mg/dL — ABNORMAL HIGH (ref 70–99)

## 2021-07-20 LAB — HEMOGLOBIN AND HEMATOCRIT, BLOOD
HCT: 38.3 % — ABNORMAL LOW (ref 39.0–52.0)
Hemoglobin: 12.5 g/dL — ABNORMAL LOW (ref 13.0–17.0)

## 2021-07-20 LAB — MRSA NEXT GEN BY PCR, NASAL: MRSA by PCR Next Gen: NOT DETECTED

## 2021-07-20 SURGERY — SIGMOIDOSCOPY, FLEXIBLE
Anesthesia: General

## 2021-07-20 MED ORDER — PROPOFOL 1000 MG/100ML IV EMUL
INTRAVENOUS | Status: AC
  Filled 2021-07-20: qty 100

## 2021-07-20 MED ORDER — PANTOPRAZOLE SODIUM 40 MG PO TBEC
40.0000 mg | DELAYED_RELEASE_TABLET | Freq: Every day | ORAL | Status: DC
  Administered 2021-07-20 – 2021-07-21 (×2): 40 mg via ORAL
  Filled 2021-07-20 (×2): qty 1

## 2021-07-20 MED ORDER — SODIUM CHLORIDE 0.9 % IV SOLN: INTRAVENOUS | Status: DC

## 2021-07-20 MED ORDER — METOPROLOL TARTRATE 5 MG/5ML IV SOLN
INTRAVENOUS | Status: AC
  Filled 2021-07-20: qty 5

## 2021-07-20 MED ORDER — OXYCODONE HCL 5 MG PO TABS: 5.0000 mg | ORAL_TABLET | Freq: Four times a day (QID) | ORAL | Status: DC | PRN

## 2021-07-20 MED ORDER — SODIUM CHLORIDE 0.9 % IV SOLN
2.0000 g | Freq: Two times a day (BID) | INTRAVENOUS | Status: DC
  Administered 2021-07-20 – 2021-07-21 (×3): 2 g via INTRAVENOUS
  Filled 2021-07-20: qty 12.5
  Filled 2021-07-20 (×3): qty 2

## 2021-07-20 MED ORDER — PROPOFOL 10 MG/ML IV BOLUS
INTRAVENOUS | Status: DC | PRN
  Administered 2021-07-20: 100 mg via INTRAVENOUS

## 2021-07-20 MED ORDER — TAMSULOSIN HCL 0.4 MG PO CAPS
0.4000 mg | ORAL_CAPSULE | Freq: Every evening | ORAL | Status: DC
Start: 2021-07-20 — End: 2021-07-22
  Administered 2021-07-20 – 2021-07-21 (×2): 0.4 mg via ORAL
  Filled 2021-07-20 (×2): qty 1

## 2021-07-20 MED ORDER — ONDANSETRON HCL 4 MG/2ML IJ SOLN: 4.0000 mg | Freq: Four times a day (QID) | INTRAMUSCULAR | Status: DC | PRN

## 2021-07-20 MED ORDER — IOHEXOL 350 MG/ML SOLN
80.0000 mL | Freq: Once | INTRAVENOUS | Status: AC | PRN
  Administered 2021-07-20: 80 mL via INTRAVENOUS

## 2021-07-20 MED ORDER — MORPHINE SULFATE (PF) 2 MG/ML IV SOLN
2.0000 mg | INTRAVENOUS | Status: DC | PRN
  Administered 2021-07-20: 2 mg via INTRAVENOUS
  Filled 2021-07-20: qty 1

## 2021-07-20 MED ORDER — MELATONIN 5 MG PO TABS
5.0000 mg | ORAL_TABLET | Freq: Once | ORAL | Status: AC
  Administered 2021-07-20: 5 mg via ORAL
  Filled 2021-07-20: qty 1

## 2021-07-20 MED ORDER — LEVOTHYROXINE SODIUM 50 MCG PO TABS
150.0000 ug | ORAL_TABLET | Freq: Every day | ORAL | Status: DC
  Administered 2021-07-20 – 2021-07-22 (×3): 150 ug via ORAL
  Filled 2021-07-20 (×3): qty 1

## 2021-07-20 MED ORDER — PROCHLORPERAZINE EDISYLATE 10 MG/2ML IJ SOLN
10.0000 mg | Freq: Four times a day (QID) | INTRAMUSCULAR | Status: DC | PRN
  Administered 2021-07-20: 10 mg via INTRAVENOUS
  Filled 2021-07-20: qty 2

## 2021-07-20 MED ORDER — LIDOCAINE HCL (CARDIAC) PF 100 MG/5ML IV SOSY
PREFILLED_SYRINGE | INTRAVENOUS | Status: DC | PRN
  Administered 2021-07-20: 100 mg via INTRAVENOUS

## 2021-07-20 MED ORDER — METOPROLOL SUCCINATE ER 25 MG PO TB24
25.0000 mg | ORAL_TABLET | Freq: Every day | ORAL | Status: DC
Start: 2021-07-20 — End: 2021-07-22
  Administered 2021-07-20 – 2021-07-22 (×3): 25 mg via ORAL
  Filled 2021-07-20 (×3): qty 1

## 2021-07-20 MED ORDER — PROPOFOL 500 MG/50ML IV EMUL
INTRAVENOUS | Status: DC | PRN
  Administered 2021-07-20: 150 ug/kg/min via INTRAVENOUS

## 2021-07-20 MED ORDER — CHLORHEXIDINE GLUCONATE CLOTH 2 % EX PADS
6.0000 | MEDICATED_PAD | Freq: Every day | CUTANEOUS | Status: DC
  Administered 2021-07-21 – 2021-07-22 (×2): 6 via TOPICAL

## 2021-07-20 MED ORDER — LEVETIRACETAM 500 MG PO TABS
500.0000 mg | ORAL_TABLET | Freq: Two times a day (BID) | ORAL | Status: DC
  Filled 2021-07-20: qty 1

## 2021-07-20 MED ORDER — AMIODARONE HCL 200 MG PO TABS: 200.0000 mg | ORAL_TABLET | Freq: Every day | ORAL | Status: DC

## 2021-07-20 MED ORDER — PANTOPRAZOLE 80MG IVPB - SIMPLE MED: 80.0000 mg | Freq: Once | INTRAVENOUS | Status: DC

## 2021-07-20 NOTE — Progress Notes (Signed)
Inquired about serial h&h with provider Bishop Limbo NP. Instructed to obtain am labs. No further orders given

## 2021-07-20 NOTE — Assessment & Plan Note (Addendum)
Secondary to demand ischemia with acute kidney injury and initial hypotension and GI bleed.

## 2021-07-20 NOTE — Assessment & Plan Note (Addendum)
Hemoglobin went from 15.7 down to 12.9.  Continue watching hemoglobin.  Could be from diverticular bleed or ischemic or inflammatory colitis or even infectious colitis.  Stool studies negative.  Placed on empiric antibiotics.  Hemoglobin dipped to 10.5 today.  Recheck hemoglobin tomorrow.  Patient did have a bowel movement without blood today.  Flexible sigmoidoscopy biopsy pending.

## 2021-07-20 NOTE — Progress Notes (Signed)
  Progress Note   Patient: Randall Flores. ZOX:[id number] DOB: 1956-01-05 DOA: 07/19/2021     1 DOS: the patient was seen and examined on 07/20/2021     Assessment and Plan: * Lower GI bleed Hemoglobin went from 15.7 down to 12.9.  Continue watching hemoglobin.  Could be from diverticular bleed or ischemic or inflammatory colitis or even infectious colitis.  Stool studies ordered.  Placed on empiric antibiotics.  Severe sepsis (HCC) Lactic acidosis, initial hypotension and tachycardia and tachypnea.  Possible diverticulitis versus acute infectious colitis versus ischemic colitis.  Patient given IV fluid hydration.  Generalized weakness Secondary to GI bleed and potential substance   S/P AVR Holding Eliquis  Paroxysmal atrial fibrillation (HCC) Currently in normal sinus rhythm.  Holding Eliquis.  We will start low-dose Toprol.  Patient no longer on amiodarone.  AKI (acute kidney injury) (HCC) Creatinine 1.92 on presentation and down to 1.4 today.  Hold lisinopril.   Essential hypertension Holding lisinopril with acute kidney injury.  We will start low-dose Toprol and watch.  Hypothyroidism Continue levothyroxine   Elevated troponin Secondary to demand ischemia with acute kidney injury and initial hypotension and GI bleed.         Subjective: Patient feels like he is going to die.  Has some abdominal pain.  Dry mouth.  No nausea or vomiting.  Having bloody diarrhea.  Physical Exam: Vitals:   07/20/21 1400 07/20/21 1508 07/20/21 1518 07/20/21 1528  BP: (!) 150/79 (!) 108/51 (!) 122/99 (!) 148/92  Pulse: 72  71 73  Resp: (!) 21  (!) 21 15  Temp:  97.7 F (36.5 C)    TempSrc:  Temporal    SpO2: 97%  98% 100%  Weight:      Height:       Physical Exam HENT:     Head: Normocephalic.     Mouth/Throat:     Pharynx: No oropharyngeal exudate.  Eyes:     General: Lids are normal.     Conjunctiva/sclera: Conjunctivae normal.  Cardiovascular:     Rate and Rhythm:  Normal rate and regular rhythm.     Heart sounds: Normal heart sounds, S1 normal and S2 normal.  Pulmonary:     Breath sounds: No decreased breath sounds, wheezing, rhonchi or rales.  Abdominal:     Palpations: Abdomen is soft.     Tenderness: There is abdominal tenderness in the left lower quadrant.  Musculoskeletal:     Right lower leg: No swelling.     Left lower leg: No swelling.  Skin:    General: Skin is warm.     Findings: No rash.  Neurological:     Mental Status: He is alert and oriented to person, place, and time.     Data Reviewed: Last hemoglobin 12.9, last creatinine 1.4  Family Communication: Updated patient's daughter on the phone  Disposition: Status is: Inpatient Remains inpatient appropriate because: Still having rectal bleeding.  Holding Eliquis.  Planned Discharge Destination: Home   Author: Alford Highland, MD 07/20/2021 4:01 PM  For on call review [email or web address].

## 2021-07-20 NOTE — Assessment & Plan Note (Addendum)
Creatinine 1.92 on presentation and down to 1.11 today.  Hold lisinopril.

## 2021-07-20 NOTE — Progress Notes (Signed)
Requested PCP notes, Labs, and EKG

## 2021-07-21 ENCOUNTER — Telehealth: Payer: Self-pay | Admitting: Cardiology

## 2021-07-21 ENCOUNTER — Encounter: Payer: Self-pay | Admitting: Gastroenterology

## 2021-07-21 DIAGNOSIS — R531 Weakness: Secondary | ICD-10-CM | POA: Diagnosis not present

## 2021-07-21 DIAGNOSIS — A419 Sepsis, unspecified organism: Secondary | ICD-10-CM | POA: Diagnosis not present

## 2021-07-21 DIAGNOSIS — Z952 Presence of prosthetic heart valve: Secondary | ICD-10-CM | POA: Diagnosis not present

## 2021-07-21 DIAGNOSIS — K559 Vascular disorder of intestine, unspecified: Secondary | ICD-10-CM

## 2021-07-21 DIAGNOSIS — N179 Acute kidney failure, unspecified: Secondary | ICD-10-CM

## 2021-07-21 DIAGNOSIS — K922 Gastrointestinal hemorrhage, unspecified: Secondary | ICD-10-CM | POA: Diagnosis not present

## 2021-07-21 LAB — BASIC METABOLIC PANEL
Anion gap: 5 (ref 5–15)
BUN: 15 mg/dL (ref 8–23)
CO2: 23 mmol/L (ref 22–32)
Calcium: 9 mg/dL (ref 8.9–10.3)
Chloride: 108 mmol/L (ref 98–111)
Creatinine, Ser: 1.11 mg/dL (ref 0.61–1.24)
GFR, Estimated: >60 mL/min (ref >60)
Glucose, Bld: 104 mg/dL — ABNORMAL HIGH (ref 70–99)
Potassium: 3.7 mmol/L (ref 3.5–5.1)
Sodium: 136 mmol/L (ref 135–145)

## 2021-07-21 LAB — CBC
HCT: 31.7 % — ABNORMAL LOW (ref 39.0–52.0)
Hemoglobin: 10.5 g/dL — ABNORMAL LOW (ref 13.0–17.0)
MCH: 28.2 pg (ref 26.0–34.0)
MCHC: 33.1 g/dL (ref 30.0–36.0)
MCV: 85.2 fL (ref 80.0–100.0)
Platelets: 171 10*3/uL (ref 150–400)
RBC: 3.72 MIL/uL — ABNORMAL LOW (ref 4.22–5.81)
RDW: 14.1 % (ref 11.5–15.5)
WBC: 11.1 10*3/uL — ABNORMAL HIGH (ref 4.0–10.5)
nRBC: 0 % (ref 0.0–0.2)

## 2021-07-21 MED ORDER — SODIUM CHLORIDE 0.9 % IV SOLN: INTRAVENOUS | Status: DC | PRN

## 2021-07-21 MED ORDER — MELATONIN 5 MG PO TABS
5.0000 mg | ORAL_TABLET | Freq: Every evening | ORAL | Status: DC | PRN
  Administered 2021-07-21: 5 mg via ORAL
  Filled 2021-07-21: qty 1

## 2021-07-21 MED ORDER — SODIUM CHLORIDE 0.9 % IV SOLN
2.0000 g | Freq: Three times a day (TID) | INTRAVENOUS | Status: DC
  Administered 2021-07-21 – 2021-07-22 (×3): 2 g via INTRAVENOUS
  Filled 2021-07-21: qty 12.5
  Filled 2021-07-21 (×3): qty 2
  Filled 2021-07-21: qty 12.5

## 2021-07-22 DIAGNOSIS — K922 Gastrointestinal hemorrhage, unspecified: Secondary | ICD-10-CM | POA: Diagnosis not present

## 2021-07-22 LAB — CBC
HCT: 30.7 % — ABNORMAL LOW (ref 39.0–52.0)
Hemoglobin: 10.4 g/dL — ABNORMAL LOW (ref 13.0–17.0)
MCH: 28.7 pg (ref 26.0–34.0)
MCHC: 33.9 g/dL (ref 30.0–36.0)
MCV: 84.8 fL (ref 80.0–100.0)
Platelets: 170 10*3/uL (ref 150–400)
RBC: 3.62 MIL/uL — ABNORMAL LOW (ref 4.22–5.81)
RDW: 13.9 % (ref 11.5–15.5)
WBC: 8.2 10*3/uL (ref 4.0–10.5)
nRBC: 0 % (ref 0.0–0.2)

## 2021-07-22 LAB — SURGICAL PATHOLOGY

## 2021-07-22 LAB — BASIC METABOLIC PANEL
Anion gap: 7 (ref 5–15)
BUN: 12 mg/dL (ref 8–23)
CO2: 25 mmol/L (ref 22–32)
Calcium: 8.8 mg/dL — ABNORMAL LOW (ref 8.9–10.3)
Chloride: 105 mmol/L (ref 98–111)
Creatinine, Ser: 1.01 mg/dL (ref 0.61–1.24)
GFR, Estimated: >60 mL/min (ref >60)
Glucose, Bld: 95 mg/dL (ref 70–99)
Potassium: 3.7 mmol/L (ref 3.5–5.1)
Sodium: 137 mmol/L (ref 135–145)

## 2021-07-22 MED ORDER — CITALOPRAM HYDROBROMIDE 20 MG PO TABS
20.0000 mg | ORAL_TABLET | Freq: Every day | ORAL | Status: DC
  Administered 2021-07-22: 20 mg via ORAL
  Filled 2021-07-22: qty 1

## 2021-07-22 MED ORDER — OXYCODONE HCL 5 MG PO TABS: 5.0000 mg | ORAL_TABLET | Freq: Four times a day (QID) | ORAL | 0 refills | Status: DC | PRN

## 2021-07-22 MED ORDER — AMOXICILLIN-POT CLAVULANATE 875-125 MG PO TABS: 1.0000 | ORAL_TABLET | Freq: Two times a day (BID) | ORAL | 0 refills | Status: AC

## 2021-07-22 MED ORDER — AMOXICILLIN-POT CLAVULANATE 875-125 MG PO TABS
1.0000 | ORAL_TABLET | Freq: Two times a day (BID) | ORAL | Status: DC
  Administered 2021-07-22: 1 via ORAL
  Filled 2021-07-22: qty 1

## 2021-07-22 MED ORDER — DULOXETINE HCL 30 MG PO CPEP
60.0000 mg | ORAL_CAPSULE | Freq: Every day | ORAL | Status: DC
Start: 2021-07-22 — End: 2021-07-22
  Administered 2021-07-22: 60 mg via ORAL
  Filled 2021-07-22: qty 2

## 2021-07-22 NOTE — Discharge Summary (Signed)
Physician Discharge Summary   Patient: Randall Flores. MRN: [id number] DOB: November 26, 1955  Admit date:     07/19/2021  Discharge date: 07/22/21  Discharge Physician: Lorella Nimrod   PCP: Frazier Richards, MD   Recommendations at discharge:  Please obtain CBC and BMP in 1 week Follow-up with gastroenterology in 1 to 2 weeks Follow-up with primary care provider  Discharge Diagnoses: Principal Problem:   Lower GI bleed Active Problems:   Severe sepsis ([place])   S/P AVR   Generalized weakness   Paroxysmal atrial fibrillation (HCC)   Essential hypertension   AKI (acute kidney injury) ([place])   Hypothyroidism   Elevated troponin   Colitis, ischemic [facility])   Hospital Course: 66 year old man with recent aortic valve replacement, postoperative atrial fibrillation, hypertension, hypothyroidism.  He presented with abdominal pain and diarrhea with blood.  CT scan showing progressive descending and sigmoid colitis.  No major bleeding.  Flexible sigmoidoscopy was done on 07/20/2021 which showed blood in the rectum diverticulosis and diffuse inflammation found in the sigmoid colon consistent with possible ischemic colitis versus inflammatory colitis.  Biopsies sent.  6/28: Biopsy results with concern of ischemic colitis.  Discussed with GI and they are suggesting conservative management as there is no necrosis.  Antibiotics switched to Augmentin to complete the course.  Patient was requesting discharge as he is taking care of his sick wife.  Patient is being discharged on current medications and will follow-up with his gastroenterologist as an outpatient for further recommendations.  Assessment and Plan: * Lower GI bleed Hemoglobin went from 15.7 down to 12.9.  Continue watching hemoglobin.  Could be from diverticular bleed or ischemic or inflammatory colitis or even infectious colitis.  Stool studies negative.  Placed on empiric antibiotics.  Hemoglobin dipped to 10.5 today.  Recheck hemoglobin  tomorrow.  Patient did have a bowel movement without blood today.  Flexible sigmoidoscopy biopsy pending.  Severe sepsis (HCC) Lactic acidosis, initial hypotension and tachycardia and tachypnea.  Possible diverticulitis versus acute infectious colitis versus ischemic colitis.  Patient given IV fluid hydration and empiric antibiotics with cefepime and Flagyl.  Likely can go home on Augmentin.  Generalized weakness Secondary to GI bleed and sepsis   S/P AVR Holding Eliquis.  Case discussed with cardiology and okay to hold Eliquis at this point.  Paroxysmal atrial fibrillation (HCC) Currently in normal sinus rhythm.  Holding Eliquis.  We will start low-dose Toprol.  Patient no longer on amiodarone.  Case discussed with cardiology and okay to hold Eliquis at this point.  AKI (acute kidney injury) ([place]) Creatinine 1.92 on presentation and down to 1.11 today.  Hold lisinopril.   Essential hypertension Holding lisinopril with acute kidney injury.  On low-dose Toprol.  Hypothyroidism Continue levothyroxine   Elevated troponin Secondary to demand ischemia with acute kidney injury and initial hypotension and GI bleed.    Consultants: Gastroenterology Procedures performed: Colonoscopy Disposition: Home Diet recommendation:  Discharge Diet Orders (From admission, onward)     Start     Ordered   07/22/21 0000  Diet - low sodium heart healthy        07/22/21 1521           Cardiac diet DISCHARGE MEDICATION: Allergies as of 07/22/2021       Reactions   Codeine Nausea And Vomiting   Ivp Dye [iodinated Contrast Media] Nausea And Vomiting   Also felt very hot/flushed   Pravastatin Other (See Comments)   Muscle Pain   Shellfish Allergy Nausea  And Vomiting        Medication List     STOP taking these medications    levETIRAcetam 500 MG tablet Commonly known as: Keppra       TAKE these medications    acetaminophen 500 MG tablet Commonly known as: TYLENOL Take  1,000 mg by mouth every 6 (six) hours as needed for moderate pain.   amiodarone 200 MG tablet Commonly known as: PACERONE Take 200 mg by mouth daily.   amoxicillin-clavulanate 875-125 MG tablet Commonly known as: AUGMENTIN Take 1 tablet by mouth every 12 (twelve) hours for 3 doses.   apixaban 5 MG Tabs tablet Commonly known as: ELIQUIS Take 1 tablet (5 mg total) by mouth 2 (two) times daily.   cholecalciferol 25 MCG (1000 UNIT) tablet Commonly known as: VITAMIN D3 Take 1,000 Units by mouth daily.   citalopram 20 MG tablet Commonly known as: CELEXA Take 20 mg by mouth daily.   DOCUSATE SODIUM PO Take 1 capsule by mouth daily as needed (stool softener).   DULoxetine 60 MG capsule Commonly known as: CYMBALTA Take 60 mg by mouth daily.   levothyroxine 150 MCG tablet Commonly known as: SYNTHROID Take 150 mcg by mouth daily before breakfast.   lisinopril 5 MG tablet Commonly known as: ZESTRIL Take 1 tablet (5 mg total) by mouth daily.   multivitamin capsule Take 1 capsule by mouth daily.   OVER THE COUNTER MEDICATION Take 1 Dose by mouth daily as needed (mouth pain). Salt Rinse   oxyCODONE 5 MG immediate release tablet Commonly known as: Oxy IR/ROXICODONE Take 1 tablet (5 mg total) by mouth every 6 (six) hours as needed for moderate pain.   rosuvastatin 20 MG tablet Commonly known as: CRESTOR Take 1 tablet (20 mg total) by mouth daily.   tamsulosin 0.4 MG Caps capsule Commonly known as: FLOMAX Take 1 capsule (0.4 mg total) by mouth daily.   traZODone 50 MG tablet Commonly known as: DESYREL Take 1 tablet (50 mg total) by mouth at bedtime as needed for sleep.        Follow-up Information     Frazier Richards, MD. Schedule an appointment as soon as possible for a visit in 1 week(s).   Specialty: Family Medicine Contact information: [place] [address] [phone]         Kate Sable, MD .   Specialties: Cardiology,  Radiology Contact information: [place] [address] [phone]         [name], MD. Schedule an appointment as soon as possible for a visit in 1 week(s).   Specialty: Gastroenterology Contact information: [place] [address] [phone]                Discharge Exam: Filed Weights   07/20/21 0010 07/20/21 0159  Weight: 89.8 kg 96.8 kg   General.     In no acute distress. Pulmonary.  Lungs clear bilaterally, normal respiratory effort. CV.  Regular rate and rhythm, no JVD, rub or murmur. Abdomen.  Soft, nontender, nondistended, BS positive. CNS.  Alert and oriented .  No focal neurologic deficit. Extremities.  No edema, no cyanosis, pulses intact and symmetrical. Psychiatry.  Judgment and insight appears normal.   Condition at discharge: stable  The results of significant diagnostics from this hospitalization (including imaging, microbiology, ancillary and laboratory) are listed below for reference.   Imaging Studies: CT ANGIO GI BLEED  Result Date: 07/20/2021 CLINICAL DATA:  GI bleeding.  EXAM: CTA ABDOMEN AND PELVIS WITHOUT AND WITH CONTRAST TECHNIQUE: Multidetector CT imaging of the abdomen and pelvis was performed using the standard protocol during bolus administration of intravenous contrast. Multiplanar reconstructed images and MIPs were obtained and reviewed to evaluate the vascular anatomy. RADIATION DOSE REDUCTION: This exam was performed according to the departmental dose-optimization program which includes automated exposure control, adjustment of the mA and/or kV according to patient size and/or use of iterative reconstruction technique. CONTRAST:  64m OMNIPAQUE IOHEXOL 350 MG/ML SOLN COMPARISON:  CT from yesterday FINDINGS: VASCULAR Aorta: Atheromatous wall thickening diffusely. No aneurysm or dissection Celiac: Unremarkable SMA: No major branch occlusion or proximal flow limiting stenosis. Renals: Smoothly  contoured and widely patent. IMA: Patent. Inflow: Atheromatous plaque without significant stenosis or ulceration Proximal Outflow: Atheromatous plaque Veins: Negative Review of the MIP images confirms the above findings. NON-VASCULAR Lower chest:  No acute finding.  Aortic valve replacement Hepatobiliary: Cystic density in the left lobe liver measuring 4.5 cm.No evidence of biliary obstruction or stone. Pancreas: Unremarkable. Spleen: Unremarkable. Adrenals/Urinary Tract: Negative adrenals. No hydronephrosis or stone. Unremarkable bladder. Stomach/Bowel: Circumferential low-density thickening of the descending and sigmoid colon with submucosal low-density edema. The extent of inflammation is progressed. Sigmoid diverticulosis. Enhancing Vasa recta of the affected colon. No perforation or obstructing process. Lymphatic: No mass or adenopathy. Reproductive:Mild symmetric enlargement of the prostate gland. Other: No ascites or pneumoperitoneum. Musculoskeletal: No acute abnormalities. Advanced cervical spine degeneration. Lower thoracic and upper lumbar spondylosis with multi-level bridging. IMPRESSION: 1. Progressive descending and sigmoid colitis. 2. No emergent vascular finding; no major branch occlusion to suggest occlusive ischemic colitis. 3. Atherosclerosis without flow limiting stenosis of the proximal vessels. Electronically Signed   By: JJorje GuildM.D.   On: 07/20/2021 05:37   CT CHEST ABDOMEN PELVIS WO CONTRAST  Result Date: 07/19/2021 CLINICAL DATA:  Chest pain EXAM: CT CHEST, ABDOMEN AND PELVIS WITHOUT CONTRAST TECHNIQUE: Multidetector CT imaging of the chest, abdomen and pelvis was performed following the standard protocol without IV contrast. RADIATION DOSE REDUCTION: This exam was performed according to the departmental dose-optimization program which includes automated exposure control, adjustment of the mA and/or kV according to patient size and/or use of iterative reconstruction technique.  COMPARISON:  CT chest 04/02/2021, MR abdomen 11/25/2020 and CT AP from 07/02/2016 FINDINGS: CT CHEST FINDINGS Cardiovascular: Previous median sternotomy and aortic valve prosthesis placement. Heart size is normal. Aortic atherosclerotic calcifications. No pericardial effusion. Mediastinum/Nodes: No enlarged mediastinal, hilar, or axillary lymph nodes. Thyroid gland, trachea, and esophagus demonstrate no significant findings. Lungs/Pleura: No pleural effusion. No airspace consolidation, atelectasis, or pneumothorax. Scar like density noted within the anterior right upper lobe and medial right middle lobe. No suspicious pulmonary nodules or mass identified. Musculoskeletal: No chest wall mass or suspicious bone lesions identified. CT ABDOMEN PELVIS FINDINGS Hepatobiliary: Simple cyst identified within lateral segment of left hepatic lobe measuring 4.5 cm. No suspicious liver lesions identified. Gallbladder appears within normal limits. No biliary ductal dilatation. Pancreas: Unremarkable. No pancreatic ductal dilatation or surrounding inflammatory changes. Spleen: Normal in size without focal abnormality. Adrenals/Urinary Tract: Normal appearance of the adrenal glands. No nephrolithiasis, hydronephrosis, or mass. The urinary bladder is unremarkable. Stomach/Bowel: Stomach is normal. The appendix is visualized and is unremarkable. There is abnormal wall thickening of the colon beginning at the level of the distal descending colon through the sigmoid colon. Multiple sigmoid diverticula are identified. There is diffuse pericolonic inflammatory fat stranding involving the thickened loops of bowel. Small amount of free fluid is noted  within the left iliac fossa and dependent portion of the pelvis. No focal fluid collections identified. No signs of pneumoperitoneum. Vascular/Lymphatic: Aortic atherosclerosis. No signs of aneurysm. No abdominopelvic adenopathy. Reproductive: Prostate is unremarkable. Other: None  Musculoskeletal: No acute or significant osseous findings. Degenerative disc disease noted within the lumbar spine. IMPRESSION: 1. Wall thickening and inflammation is noted involving the distal descending colon and sigmoid colon with numerous colonic diverticula noted in this area. Imaging findings are consistent with acute sigmoid diverticulitis versus segmental colitis. 2. No signs of bowel perforation or abscess formation. No of bowel obstruction. 3. Aortic Atherosclerosis (ICD10-I70.0). Electronically Signed   By: Kerby Moors M.D.   On: 07/19/2021 20:51   DG Chest Port 1 View  Result Date: 07/19/2021 CLINICAL DATA:  Sepsis EXAM: PORTABLE CHEST 1 VIEW COMPARISON:  None Available. FINDINGS: Lungs are clear. No pneumothorax or pleural effusion. Aortic valve replacement has been performed. Cardiac size within normal limits. Pulmonary vascularity is normal. No acute bone abnormality. IMPRESSION: No active disease. Electronically Signed   By: Fidela Salisbury M.D.   On: 07/19/2021 19:55   LONG TERM MONITOR (3-14 DAYS)  Result Date: 07/07/2021 Patch Wear Time:  11 days and 21 hours (2023-05-24T10:49:06-398 to 2023-06-05T08:19:39-0400) Patient had a min HR of 50 bpm, max HR of 103 bpm, and avg HR of 69 bpm. Predominant underlying rhythm was Sinus Rhythm. First Degree AV Block was present. Isolated SVEs were rare (<1.0%), and no SVE Couplets or SVE Triplets were present. Isolated VEs were rare (<1.0%), and no VE Couplets or VE Triplets were present. Normal cardiac monitor no evidence for atrial fibrillation or atrial flutter.   Microbiology: Results for orders placed or performed during the hospital encounter of 07/19/21  Blood Culture (routine x 2)     Status: None (Preliminary result)   Collection Time: 07/19/21  7:07 PM   Specimen: BLOOD  Result Value Ref Range Status   Specimen Description BLOOD RIGHT [facility]  Final   Special Requests   Final    BOTTLES DRAWN AEROBIC AND ANAEROBIC Blood Culture  adequate volume   Culture   Final    NO GROWTH 3 DAYS Performed at [facility], [address], [place], [place] [address]    Report Status PENDING  Incomplete  Blood Culture (routine x 2)     Status: None (Preliminary result)   Collection Time: 07/19/21  8:21 PM   Specimen: BLOOD  Result Value Ref Range Status   Specimen Description BLOOD LEFT AC  Final   Special Requests   Final    BOTTLES DRAWN AEROBIC AND ANAEROBIC Blood Culture results may not be optimal due to an inadequate volume of blood received in culture bottles   Culture   Final    NO GROWTH 3 DAYS Performed at [facility], [address], [place], [place] [address]    Report Status PENDING  Incomplete  MRSA Next Gen by PCR, Nasal     Status: None   Collection Time: 07/20/21 12:41 AM   Specimen: Nasal Mucosa; Nasal Swab  Result Value Ref Range Status   MRSA by PCR Next Gen NOT DETECTED NOT DETECTED Final    Comment: (NOTE) The GeneXpert MRSA Assay (FDA approved for NASAL specimens only), is one component of a comprehensive MRSA colonization surveillance program. It is not intended to diagnose MRSA infection nor to guide or monitor treatment for MRSA infections. Test performance is not FDA approved in patients less than 85 years old. Performed at [facility], [place], [place] [address]   C Difficile Quick Screen w PCR reflex     Status: None   Collection Time: 07/20/21  6:43 PM   Specimen: STOOL  Result Value Ref Range Status   C Diff antigen NEGATIVE NEGATIVE Final   C Diff toxin NEGATIVE NEGATIVE Final   C Diff interpretation No C. difficile detected.  Final    Comment: Performed at [facility], [address] [place], [place] [address]  Gastrointestinal Panel by PCR , Stool     Status: None   Collection Time: 07/20/21  6:43 PM   Specimen: STOOL  Result Value Ref Range Status   Campylobacter species NOT DETECTED NOT DETECTED Final   Plesimonas  shigelloides NOT DETECTED NOT DETECTED Final   Salmonella species NOT DETECTED NOT DETECTED Final   Yersinia enterocolitica NOT DETECTED NOT DETECTED Final   Vibrio species NOT DETECTED NOT DETECTED Final   Vibrio cholerae NOT DETECTED NOT DETECTED Final   Enteroaggregative E coli (EAEC) NOT DETECTED NOT DETECTED Final   Enteropathogenic E coli (EPEC) NOT DETECTED NOT DETECTED Final   Enterotoxigenic E coli (ETEC) NOT DETECTED NOT DETECTED Final   Shiga like toxin producing E coli (STEC) NOT DETECTED NOT DETECTED Final   Shigella/Enteroinvasive E coli (EIEC) NOT DETECTED NOT DETECTED Final   Cryptosporidium NOT DETECTED NOT DETECTED Final   Cyclospora cayetanensis NOT DETECTED NOT DETECTED Final   Entamoeba histolytica NOT DETECTED NOT DETECTED Final   Giardia lamblia NOT DETECTED NOT DETECTED Final   Adenovirus F40/41 NOT DETECTED NOT DETECTED Final   Astrovirus NOT DETECTED NOT DETECTED Final   Norovirus GI/GII NOT DETECTED NOT DETECTED Final   Rotavirus A NOT DETECTED NOT DETECTED Final   Sapovirus (I, II, IV, and V) NOT DETECTED NOT DETECTED Final    Comment: Performed at [facility], [address] [place], [place] [address]    Labs: CBC: Recent Labs  Lab 07/19/21 1907 07/19/21 2234 07/19/21 2334 07/20/21 0424 07/21/21 0753 07/22/21 0608  WBC 16.3* 17.4*  --  15.1* 11.1* 8.2  NEUTROABS 13.9* 15.5*  --   --   --   --   HGB 15.7 13.2 12.5* 12.9* 10.5* 10.4*  HCT 48.4 40.6 38.3* 38.5* 31.7* 30.7*  MCV 87.4 85.7  --  84.2 85.2 84.8  PLT 292 224  --  207 171 299   Basic Metabolic Panel: Recent Labs  Lab 07/19/21 1907 07/19/21 2234 07/20/21 0418 07/21/21 0753 07/22/21 0608  NA 135 137 134* 136 137  K 3.5 4.1 4.1 3.7 3.7  CL 102 107 105 108 105  CO2 20* 21* 21* 23 25  GLUCOSE 178* 159* 155* 104* 95  BUN '17 19 21 15 12  '$ CREATININE 1.92* 1.77* 1.40* 1.11 1.01  CALCIUM 10.6* 9.4 9.2 9.0 8.8*   Liver Function Tests: Recent Labs  Lab 07/19/21 1907  07/20/21 0418  AST 32 25  ALT 22 19  ALKPHOS 84 58  BILITOT 0.7 0.6  PROT 8.0 6.2*  ALBUMIN 4.9 3.6   CBG: Recent Labs  Lab 07/20/21 0029  GLUCAP 162*    Discharge time spent: greater than 30 minutes.  This record has been created using Systems analyst. Errors have been sought and corrected,but may not always be located. Such creation errors do not reflect on the standard of care.   Signed: Lorella Nimrod, MD [facility] 07/22/2021

## 2021-07-22 NOTE — Anesthesia Postprocedure Evaluation (Signed)
Anesthesia Post Note  Patient: Randall Flores.  Procedure(s) Performed: [place]  Patient location during evaluation: PACU Anesthesia Type: General Level of consciousness: awake and alert Pain management: pain level controlled Vital Signs Assessment: post-procedure vital signs reviewed and stable Respiratory status: spontaneous breathing, nonlabored ventilation, respiratory function stable and patient connected to nasal cannula oxygen Cardiovascular status: blood pressure returned to baseline and stable Postop Assessment: no apparent nausea or vomiting Anesthetic complications: no   No notable events documented.   Last Vitals:  Vitals:   07/22/21 0844 07/22/21 1231  BP: (!) 161/90 (!) 159/85  Pulse: 72 66  Resp:  12  Temp: 37.2 C 36.4 C  SpO2: 99% 100%    Last Pain:  Vitals:   07/22/21 1231  TempSrc: Oral  PainSc:                  Molli Barrows

## 2021-07-22 NOTE — Progress Notes (Signed)
Patient discharging home. Vital signs stable at time of discharge as reflected in discharge summary. Discharge instructions given and verbal understanding returned. No questions at this time. 

## 2021-07-22 NOTE — Progress Notes (Signed)
  Transition of Care [facility]) Screening Note   Patient Details  Name: Randall Flores. Date of Birth: 1955-02-15   Transition of Care [facility]) CM/SW Contact:    Alberteen Sam, LCSW Phone Number: 07/22/2021, 3:51 PM    Transition of Care Department [facility]) has reviewed patient and no TOC needs have been identified at this time. We will continue to monitor patient advancement through interdisciplinary progression rounds. If new patient transition needs arise, please place a TOC consult.  [place], [place]

## 2021-07-23 ENCOUNTER — Encounter: Payer: Self-pay | Admitting: Gastroenterology

## 2021-07-24 ENCOUNTER — Ambulatory Visit: Payer: Medicare HMO | Admitting: Cardiology

## 2021-07-24 LAB — CULTURE, BLOOD (ROUTINE X 2)
Culture: NO GROWTH
Culture: NO GROWTH
Special Requests: ADEQUATE

## 2021-08-11 ENCOUNTER — Telehealth: Payer: Self-pay | Admitting: Cardiology

## 2021-08-11 NOTE — Telephone Encounter (Signed)
Patient stated that he has not felt right since his heart surgery in March. Patient stated that today he bent over and started seeing stars, he then took his BP and it was 99/55. Patient recently had  labs drawn, BMP was unremarkable and CBC is copied below.   Patient had passed out back in Jun 03, 2022 after surgery due to dehydration. I advised that he stays hydrated, makes slow postural changes, and continues to monitor his BP. Also advised that if his BP drops lower and/or he has worsening symptoms he should seek treatment at the ED. Scheduled patient for this Friday with Dr. Garen Lah, patient was very grateful for the follow up.

## 2021-08-11 NOTE — Telephone Encounter (Signed)
STAT if patient feels like he/she is going to faint   Are you dizzy now? yes  Do you feel faint or have you passed out? yes  Do you have any other symptoms? Dont have any energy to do anytying  Have you checked your HR and BP (record if available)? 99/55   Called triage twice no one answer

## 2021-08-14 ENCOUNTER — Encounter: Payer: Self-pay | Admitting: Cardiology

## 2021-08-14 ENCOUNTER — Ambulatory Visit: Payer: Medicare HMO | Admitting: Cardiology

## 2021-08-14 VITALS — BP 132/78 | HR 79 | Ht 70.0 in | Wt 209.0 lb

## 2021-08-14 DIAGNOSIS — Z952 Presence of prosthetic heart valve: Secondary | ICD-10-CM | POA: Diagnosis not present

## 2021-08-14 DIAGNOSIS — E782 Mixed hyperlipidemia: Secondary | ICD-10-CM

## 2021-08-14 DIAGNOSIS — R42 Dizziness and giddiness: Secondary | ICD-10-CM

## 2021-08-14 NOTE — Progress Notes (Signed)
Cardiology Office Note:    Date:  08/14/2021   ID:  Randall Nash., DOB [birth year], MRN [id number]  PCP:  Frazier Richards, MD   [facility] Providers Cardiologist:  Kate Sable, MD     Referring MD: Frazier Richards, MD   Chief Complaint  Patient presents with   Dizziness    Patient c/o sharp pain in chest that comes and goes, dizziness, blurred vision in right eye, fatigue, tired, headache since the last [facility] ER discharge on 07/20/2021 as well as being  lightheaded today with walking. Medications reviewed by the patient's bottles.     History of Present Illness:    Randall Peak. is a 66 y.o. male with a hx of Severe AS s/p AVR bioprosthetic valve 03/2021, postop atrial flutter, hypertension, hyperlipidemia, anxiety who presents due to dizziness.  Patient states having dizziness since starting lisinopril for blood pressure after last visit.  Blood pressures at home occasionally dropped to systolic of 16W.  He stopped taking lisinopril with improvement in symptoms, although he still occasionally gets dizzy.  Denies palpitations, denies syncope.  Prior notes Echo 02/2021 EF 60 to 65%, severe aortic stenosis Left heart cath 02/2021 no angiographic significant CAD Lexiscan Myoview 01/2021 no evidence of ischemia Developed postop A-fib after aortic valve replacement.  Took Eliquis x6 weeks.  Eliquis stopped, follow-up cardiac monitor did not show any A-fib or flutter recurrence.  he has a strong family history of heart attacks, with his dad having heart attack in his 4s, several cousins and uncles on the dad side have had heart attacks.   Past Medical History:  Diagnosis Date   Anxiety    BPH with obstruction/lower urinary tract symptoms    Colon polyp    Multiple   Complication of anesthesia    wakes up during anesthesia   DDD (degenerative disc disease), lumbar    Diverticulitis    Diverticulosis    Elevated PSA    Fatty liver 01/22/2015   Mild, noted on [place]  Abd   Hepatic cyst 06/08/2016   Left, noted on CT renal   Hepatic hemangioma 02/03/2015   Small, noted on MRI Abd   History of colitis    History of kidney stones    Hypercholesteremia    Hypertension    Hypothyroidism    Major depression    Moderate aortic stenosis 05/24/2017   Noted on ECHO   Near syncope 04/25/2017   due to stress   OA (osteoarthritis)    knees, back, hands   Pre-diabetes    PVC (premature ventricular contraction)    Stroke ([place]) 2015   TIA/right side affected, right side has improved   Wears dentures    partial upper, front    Past Surgical History:  Procedure Laterality Date   AORTIC VALVE REPLACEMENT N/A 04/16/2021   Procedure: AORTIC VALVE REPLACEMENT (AVR) USING EDWARDS INSPIRIS AORTIC VALVE 25MM;  Surgeon: Gaye Pollack, MD;  Location: [place];  Service: Open Heart Surgery;  Laterality: N/A;   [place], 2001   [place] L5-S1   COLONOSCOPY     [place]   COLONOSCOPY WITH PROPOFOL N/A 01/21/2017   Procedure: COLONOSCOPY WITH PROPOFOL;  Surgeon: [name], MD;  Location: [place];  Service: Endoscopy;  Laterality: N/A;   FLEXIBLE SIGMOIDOSCOPY N/A 07/20/2021   Procedure: FLEXIBLE SIGMOIDOSCOPY;  Surgeon: [name], MD;  Location: [facility];  Service: Endoscopy;  Laterality: N/A;   POLYPECTOMY  01/21/2017   Procedure:  POLYPECTOMY;  Surgeon: [name], MD;  Location: [place];  Service: Endoscopy;;   RIGHT HEART CATH AND CORONARY ANGIOGRAPHY N/A 03/10/2021   Procedure: RIGHT HEART CATH AND CORONARY ANGIOGRAPHY;  Surgeon: Nelva Bush, MD;  Location: [place] [facility];  Service: Cardiovascular;  Laterality: N/A;   TEE WITHOUT CARDIOVERSION N/A 04/16/2021   Procedure: TRANSESOPHAGEAL ECHOCARDIOGRAM (TEE);  Surgeon: Gaye Pollack, MD;  Location: [place];  Service: Open Heart Surgery;  Laterality: N/A;   TOTAL KNEE ARTHROPLASTY Right 04/07/2018   Procedure: TOTAL KNEE ARTHROPLASTY;  Surgeon: Earlie Server,  MD;  Location: [facility];  Service: Orthopedics;  Laterality: Right;    Current Medications: Current Meds  Medication Sig   acetaminophen (TYLENOL) 500 MG tablet Take 1,000 mg by mouth every 6 (six) hours as needed for moderate pain.   cholecalciferol (VITAMIN D3) 25 MCG (1000 UNIT) tablet Take 1,000 Units by mouth daily.   citalopram (CELEXA) 20 MG tablet Take 20 mg by mouth daily.   DOCUSATE SODIUM PO Take 1 capsule by mouth daily as needed (stool softener).   DULoxetine (CYMBALTA) 60 MG capsule Take 60 mg by mouth daily.   levothyroxine (SYNTHROID, LEVOTHROID) 150 MCG tablet Take 150 mcg by mouth daily before breakfast.    Multiple Vitamin (MULTIVITAMIN) capsule Take 1 capsule by mouth daily.   rosuvastatin (CRESTOR) 20 MG tablet Take 1 tablet (20 mg total) by mouth daily.   tamsulosin (FLOMAX) 0.4 MG CAPS capsule Take 1 capsule (0.4 mg total) by mouth daily.   traZODone (DESYREL) 50 MG tablet Take 1 tablet (50 mg total) by mouth at bedtime as needed for sleep.     Allergies:   Codeine, Ivp dye [iodinated contrast media], Pravastatin, and Shellfish allergy   Social History   Socioeconomic History   Marital status: Married    Spouse name: [name]   Number of children: 2   Years of education: Not on file   Highest education level: Not on file  Occupational History   Not on file  Tobacco Use   Smoking status: Never   Smokeless tobacco: Never  Vaping Use   Vaping Use: Never used  Substance and Sexual Activity   Alcohol use: Not Currently    Comment: 6 beers/week   Drug use: Yes    Types: Marijuana    Comment: had beg. of Feb. 2023   Sexual activity: Not on file  Other Topics Concern   Not on file  Social History Narrative   Lives at home with wife    Social Determinants of Health   Financial Resource Strain: Not on file  Food Insecurity: Not on file  Transportation Needs: Not on file  Physical Activity: Not on file  Stress: Not on file  Social Connections: Not  on file     Family History: The patient's family history includes Alzheimer's disease in his mother.  ROS:   Please see the history of present illness.     All other systems reviewed and are negative.  EKGs/Labs/Other Studies Reviewed:    The following studies were reviewed today:   EKG:  EKG is ordered today.  EKG shows normal sinus rhythm, normal ECG.  Recent Labs: 04/24/2021: B Natriuretic Peptide 364.7; Magnesium 2.1 04/25/2021: TSH 3.261 07/20/2021: ALT 19 07/22/2021: BUN 12; Creatinine, Ser 1.01; Hemoglobin 10.4; Platelets 170; Potassium 3.7; Sodium 137  Recent Lipid Panel    Component Value Date/Time   CHOL 175 06/28/2013 0624   TRIG 214 (H) 06/28/2013 6720  HDL 27 (L) 06/28/2013 0624   VLDL 43 (H) 06/28/2013 0624   LDLCALC 105 (H) 06/28/2013 0624   Outside lipid panel 01/28/2021 total cholesterol 137, HDL 37, LDL 78, triglycerides 124.  Risk Assessment/Calculations:         Physical Exam:    VS:  BP 132/78 (BP Location: Left Arm, Patient Position: Sitting, Cuff Size: Normal)   Pulse 79   Ht '5\' 10"'$  (1.778 m)   Wt 209 lb (94.8 kg)   SpO2 98%   BMI 29.99 kg/m     Wt Readings from Last 3 Encounters:  08/14/21 209 lb (94.8 kg)  07/20/21 213 lb 6.5 oz (96.8 kg)  06/12/21 215 lb 6 oz (97.7 kg)     GEN:  Well nourished, well developed in no acute distress HEENT: Normal NECK: No JVD; No carotid bruits CARDIAC: RRR, no murmur RESPIRATORY:  Clear to auscultation without rales, wheezing or rhonchi  ABDOMEN: Soft, non-tender, non-distended MUSCULOSKELETAL:  No edema; No deformity  SKIN: Warm and dry NEUROLOGIC:  Alert and oriented x 3 PSYCHIATRIC:  Normal affect   ASSESSMENT:    1. Dizziness   2. S/P AVR   3. Mixed hyperlipidemia    PLAN:    In order of problems listed above:  Dizziness, hypotension, agree with stopping lisinopril, symptoms improved.  If symptoms persist despite stopping lisinopril, consider holding tamsulosin after patient discussing  with urology. Severe aortic stenosis s/p AVR bioprosthetic valve 03/2021.  No murmur noted on exam, continue to monitor serially with echocardiograms. Hyperlipidemia, continue Crestor 20 mg daily.  Follow-up in 6 months.     Medication Adjustments/Labs and Tests Ordered: Current medicines are reviewed at length with the patient today.  Concerns regarding medicines are outlined above.  Orders Placed This Encounter  Procedures   EKG 12-Lead   No orders of the defined types were placed in this encounter.   Patient Instructions  Medication Instructions:  Your physician recommends that you continue on your current medications as directed. Please refer to the Current Medication list given to you today.  *If you need a refill on your cardiac medications before your next appointment, please call your pharmacy*   Lab Work: NONE   If you have labs (blood work) drawn today and your tests are completely normal, you will receive your results only by: [place] (if you have MyChart) OR A paper copy in the mail If you have any lab test that is abnormal or we need to change your treatment, we will call you to review the results.   Testing/Procedures: NONE     Follow-Up: At [facility], you and your health needs are our priority.  As part of our continuing mission to provide you with exceptional heart care, we have created designated Provider Care Teams.  These Care Teams include your primary Cardiologist (physician) and Advanced Practice Providers (APPs -  Physician Assistants and Nurse Practitioners) who all work together to provide you with the care you need, when you need it.  We recommend signing up for the patient portal called "MyChart".  Sign up information is provided on this After Visit Summary.  MyChart is used to connect with patients for Virtual Visits (Telemedicine).  Patients are able to view lab/test results, encounter notes, upcoming appointments, etc.  Non-urgent  messages can be sent to your provider as well.   To learn more about what you can do with MyChart, go to [email or web address].    Your next appointment:   6 month(s)  The format for your next appointment:   In Person  Provider:   You may see Kate Sable, MD or one of the following Advanced Practice Providers on your designated Care Team:   Murray Hodgkins, NP Christell Faith, PA-C Cadence Kathlen Mody, [place]      Important Information About Sugar         Signed, Kate Sable, MD  08/14/2021 5:04 PM    [place]

## 2021-08-14 NOTE — Patient Instructions (Signed)
Medication Instructions:  Your physician recommends that you continue on your current medications as directed. Please refer to the Current Medication list given to you today.  *If you need a refill on your cardiac medications before your next appointment, please call your pharmacy*   Lab Work: NONE   If you have labs (blood work) drawn today and your tests are completely normal, you will receive your results only by: [place] (if you have MyChart) OR A paper copy in the mail If you have any lab test that is abnormal or we need to change your treatment, we will call you to review the results.   Testing/Procedures: NONE     Follow-Up: At [facility], you and your health needs are our priority.  As part of our continuing mission to provide you with exceptional heart care, we have created designated Provider Care Teams.  These Care Teams include your primary Cardiologist (physician) and Advanced Practice Providers (APPs -  Physician Assistants and Nurse Practitioners) who all work together to provide you with the care you need, when you need it.  We recommend signing up for the patient portal called "MyChart".  Sign up information is provided on this After Visit Summary.  MyChart is used to connect with patients for Virtual Visits (Telemedicine).  Patients are able to view lab/test results, encounter notes, upcoming appointments, etc.  Non-urgent messages can be sent to your provider as well.   To learn more about what you can do with MyChart, go to [email or web address].    Your next appointment:   6 month(s)  The format for your next appointment:   In Person  Provider:   You may see Kate Sable, MD or one of the following Advanced Practice Providers on your designated Care Team:   Murray Hodgkins, NP Christell Faith, PA-C Cadence Kathlen Mody, [place]      Important Information About Sugar

## 2021-09-18 ENCOUNTER — Emergency Department: Payer: Medicare HMO

## 2021-09-18 ENCOUNTER — Emergency Department
Admission: EM | Admit: 2021-09-18 | Discharge: 2021-09-18 | Disposition: A | Discharge status: Home / Self Care | Payer: Medicare HMO | Attending: Emergency Medicine | Admitting: Emergency Medicine

## 2021-09-18 ENCOUNTER — Other Ambulatory Visit: Payer: Self-pay

## 2021-09-18 DIAGNOSIS — M25512 Pain in left shoulder: Secondary | ICD-10-CM | POA: Diagnosis present

## 2021-09-18 DIAGNOSIS — S42202A Unspecified fracture of upper end of left humerus, initial encounter for closed fracture: Secondary | ICD-10-CM | POA: Diagnosis not present

## 2021-09-18 DIAGNOSIS — Y92828 Other wilderness area as the place of occurrence of the external cause: Secondary | ICD-10-CM | POA: Insufficient documentation

## 2021-09-18 DIAGNOSIS — E039 Hypothyroidism, unspecified: Secondary | ICD-10-CM | POA: Insufficient documentation

## 2021-09-18 DIAGNOSIS — I10 Essential (primary) hypertension: Secondary | ICD-10-CM | POA: Diagnosis not present

## 2021-09-18 DIAGNOSIS — Y9323 Activity, snow (alpine) (downhill) skiing, snow boarding, sledding, tobogganing and snow tubing: Secondary | ICD-10-CM | POA: Diagnosis not present

## 2021-09-18 DIAGNOSIS — W1781XA Fall down embankment (hill), initial encounter: Secondary | ICD-10-CM | POA: Insufficient documentation

## 2021-09-18 MED ORDER — OXYCODONE-ACETAMINOPHEN 5-325 MG PO TABS
1.0000 | ORAL_TABLET | ORAL | Status: DC | PRN
  Administered 2021-09-18: 1 via ORAL
  Filled 2021-09-18: qty 1

## 2021-09-18 MED ORDER — OXYCODONE HCL 5 MG PO TABS: 5.0000 mg | ORAL_TABLET | Freq: Three times a day (TID) | ORAL | 0 refills | Status: DC | PRN

## 2021-09-18 MED ORDER — MORPHINE SULFATE (PF) 4 MG/ML IV SOLN
5.0000 mg | Freq: Once | INTRAVENOUS | Status: AC
  Administered 2021-09-18: 5 mg via INTRAMUSCULAR
  Filled 2021-09-18: qty 2

## 2021-09-18 NOTE — Discharge Instructions (Addendum)
You have a proximal humerus fracture.  Keep your arm in the sling and please call orthopedics to follow-up this week.  You may take the oxycodone as needed for pain, but remember that this is highly addictive and may impair your judgment, so please only use for breakthrough pain when Tylenol and ibuprofen do not work first.  Randall Flores may take Tylenol 650 mg and ibuprofen 600 mg every 6-8 hours.  Also remember that you cannot drive, operate heavy machinery, or perform any tasks that require concentration while taking the oxycodone.  Please return for any new, worsening, or change in symptoms or other concerns.  It was a pleasure caring for you today.

## 2021-09-18 NOTE — ED Triage Notes (Signed)
Pt to ED POV with family member for L shoulder injury after slipping down embankment and landing on L shoulder about 1hour ago while chasing dog.  L arm/shoulder currently in sling. Radial pulses +2 bilaterally.  CABG in March 2023. 2 recent falls, lisinopril recently tapered down.

## 2021-09-18 NOTE — ED Provider Notes (Signed)
[facility] Provider Note    Event Date/Time   First MD Initiated Contact with Patient 09/18/21 1303     (approximate)   History   Shoulder Injury   HPI  Randall Flores. is a 66 y.o. male with a past medical history of open heart surgery, hyperlipidemia, hypertension who presents today after a slip and fall.  He reports that his dogs got out and he was chasing after them when he slipped and fell down an embankment.  He reports that he landed directly on his left shoulder.  He denies head strike or LOC.  He reports that he developed pain immediately in his left shoulder.  He denies numbness or tingling.  He was able to get himself up without assistance and has had no lower extremity pain.  He has not had any headache, neck pain, back pain, nausea, vomiting, vision changes, abdominal pain, chest pain, or shortness of breath.  He has pain specifically with any attempted movement of his arm.  Patient Active Problem List   Diagnosis Date Noted   Elevated troponin 07/20/2021   Paroxysmal atrial fibrillation ([place]) 07/20/2021   Colitis, ischemic (HCC)    Generalized weakness 07/19/2021   Lower GI bleed 07/19/2021   Severe sepsis ([place]) 07/19/2021   AKI (acute kidney injury) ([place]) 07/19/2021   Caregiver stress syndrome 04/25/2021   Essential hypertension 04/25/2021   Hypothyroidism 04/25/2021   Depression 04/25/2021   BPH (benign prostatic hyperplasia) 04/25/2021   Hyperlipidemia 04/25/2021   Normocytic anemia 04/25/2021   Syncope 04/24/2021   Orthostatic hypotension    S/P AVR 04/16/2021   Severe aortic stenosis 03/10/2021   Primary localized osteoarthritis of right knee 04/07/2018   Colon cancer screening    Pseudopolyposis of colon without complication (HCC)    Abdominal pain, right upper quadrant 02/06/2015   Right lower quadrant abdominal pain 02/06/2015          Physical Exam   Triage Vital Signs: ED Triage Vitals  Enc Vitals Group     BP  09/18/21 1236 (!) 161/91     Pulse Rate 09/18/21 1236 81     Resp 09/18/21 1236 16     Temp 09/18/21 1236 98.5 F (36.9 C)     Temp Source 09/18/21 1236 Oral     SpO2 09/18/21 1236 95 %     Weight 09/18/21 1238 213 lb (96.6 kg)     Height 09/18/21 1238 '5\' 10"'$  (1.778 m)     Head Circumference --      Peak Flow --      Pain Score 09/18/21 1237 9     Pain Loc --      Pain Edu? --      Excl. in [place]? --     Most recent vital signs: Vitals:   09/18/21 1236  BP: (!) 161/91  Pulse: 81  Resp: 16  Temp: 98.5 F (36.9 C)  SpO2: 95%    Physical Exam Vitals and nursing note reviewed.  Constitutional:      General: Awake and alert. No acute distress.    Appearance: Normal appearance. The patient is overweight.  HENT:     Head: Normocephalic and atraumatic.     Mouth: Mucous membranes are moist.  Eyes:     General: PERRL. Normal EOMs        Right eye: No discharge.        Left eye: No discharge.     Conjunctiva/sclera: Conjunctivae normal.  Cardiovascular:  Rate and Rhythm: Normal rate and regular rhythm.     Pulses: Normal pulses.     Heart sounds: Normal heart sounds Pulmonary:     Effort: Pulmonary effort is normal. No respiratory distress.     Breath sounds: Normal breath sounds.  No chest wall tenderness or ecchymosis Abdominal:     Abdomen is soft. There is no abdominal tenderness. No rebound or guarding. No distention.  No abdominal tenderness or ecchymosis Musculoskeletal:        General: No swelling. Normal range of motion.     Cervical back: Normal range of motion and neck supple.  No midline cervical spine tenderness.  Full range of motion of neck.  Negative Spurling test.  Negative Lhermitte sign.  Normal strength and sensation in bilateral upper extremities. Normal grip strength bilaterally.  Normal intrinsic muscle function of the hand bilaterally.  Normal radial pulses bilaterally. Left shoulder: Tenderness to palpation along anterior and lateral shoulder joint  line and proximal humerus.  No tenderness along the distal humerus, elbow, forearm, or wrist/hand.  No snuffbox tenderness.  Able to give thumbs up, cross fingers, make okay sign and hold closed against resistance.  Normal radial pulse.  Sensation intact light touch and equal to opposite throughout the entire arm.  Range of motion of the wrist and elbow normal, however patient has restricted range of motion of his shoulder secondary to pain. Skin:    General: Skin is warm and dry.     Capillary Refill: Capillary refill takes less than 2 seconds.     Findings: No rash.  Neurological:     Mental Status: The patient is awake and alert.  Neurological: GCS 15 alert and oriented x3 Normal speech, no expressive or receptive aphasia or dysarthria Cranial nerves II through XII intact Normal visual fields 5 out of 5 strength in all 4 extremities with intact sensation throughout No extremity drift Normal finger-to-nose testing, no limb or truncal ataxia     ED Results / Procedures / Treatments   Labs (all labs ordered are listed, but only abnormal results are displayed) Labs Reviewed - No data to display   EKG     RADIOLOGY I independently reviewed and interpreted imaging and agree with radiologists findings.     PROCEDURES:  Critical Care performed:   Procedures   MEDICATIONS ORDERED IN ED: Medications  oxyCODONE-acetaminophen (PERCOCET/ROXICET) 5-325 MG per tablet 1 tablet (1 tablet Oral Given 09/18/21 1245)  morphine (PF) 4 MG/ML injection 5 mg (5 mg Intramuscular Given 09/18/21 1339)     IMPRESSION / MDM / ASSESSMENT AND PLAN / ED COURSE  I reviewed the triage vital signs and the nursing notes.   Differential diagnosis includes, but is not limited to, fracture, dislocation, fracture dislocation, contusion, rotator cuff injury.  Patient is awake and alert, hemodynamically stable and afebrile.  He has normal radial pulse, sensation intact light touch throughout his arm.  No  head strike or LOC, no neurological deficits, no headache, no vomiting, no anticoagulation.  No chest wall tenderness or ecchymosis to suggest intrathoracic injury.  No abdominal pain or tenderness, no abdominal wall ecchymosis or evidence of injury to suggest intra-abdominal abnormality, and patient is hemodynamically stable.  X-ray of shoulder demonstrates comminuted displaced fracture at the surgical neck without dislocation.  These findings were discussed with the patient.  He was placed in a sling.  He remained neurovascular intact both before and after splint placement.  He was treated symptomatically with morphine IM.  He was  started on oxycodone to take at home, though advised that this is highly addictive and should only be used for breakthrough pain when alternatives do not work first.  He was also advised that he cannot drive, operate heavy machinery, or perform any test that require concentration while taking this medication.  We discussed strict return precautions and the importance of close outpatient follow-up.  He was given the appropriate information for orthopedics and encouraged to arrange an appointment within the next week.  Patient understands and agrees with plan.  He was discharged in stable condition with his family member who is driving him home.  He is ambulatory with a steady gait.  All questions were answered.   Patient's presentation is most consistent with acute complicated illness / injury requiring diagnostic workup.     FINAL CLINICAL IMPRESSION(S) / ED DIAGNOSES   Final diagnoses:  Closed fracture of proximal end of left humerus, unspecified fracture morphology, initial encounter     Rx / DC Orders   ED Discharge Orders          Ordered    oxyCODONE (ROXICODONE) 5 MG immediate release tablet  Every 8 hours PRN        09/18/21 1427             Note:  This document was prepared using Dragon voice recognition software and may include unintentional  dictation errors.   Emeline Gins 09/18/21 1441    Nena Polio, MD 09/18/21 [phone]

## 2021-09-22 ENCOUNTER — Other Ambulatory Visit (HOSPITAL_COMMUNITY): Payer: Self-pay | Admitting: Orthopedic Surgery

## 2021-09-22 ENCOUNTER — Ambulatory Visit
Admission: RE | Admit: 2021-09-22 | Discharge: 2021-09-22 | Disposition: A | Discharge status: Home / Self Care | Payer: Medicare HMO | Source: Ambulatory Visit | Attending: Orthopedic Surgery | Admitting: Orthopedic Surgery

## 2021-09-22 ENCOUNTER — Other Ambulatory Visit: Payer: Self-pay | Admitting: Orthopedic Surgery

## 2021-09-22 DIAGNOSIS — S42212A Unspecified displaced fracture of surgical neck of left humerus, initial encounter for closed fracture: Secondary | ICD-10-CM | POA: Insufficient documentation

## 2021-09-25 ENCOUNTER — Other Ambulatory Visit: Payer: Medicare HMO

## 2021-09-30 ENCOUNTER — Ambulatory Visit: Payer: Medicare HMO | Admitting: Urology

## 2021-10-28 ENCOUNTER — Telehealth: Payer: Self-pay | Admitting: Cardiology

## 2021-10-28 NOTE — Telephone Encounter (Signed)
Left message to call back  

## 2021-10-28 NOTE — Telephone Encounter (Signed)
Cindy with [facility] is requesting to review patient's medication list, per Dr. Sherril Cong.

## 2021-10-30 ENCOUNTER — Other Ambulatory Visit: Payer: Self-pay | Admitting: Family Medicine

## 2021-10-30 DIAGNOSIS — M8580 Other specified disorders of bone density and structure, unspecified site: Secondary | ICD-10-CM

## 2021-10-30 NOTE — Telephone Encounter (Signed)
Called and left a VM requesting a call back. °

## 2021-11-03 NOTE — Telephone Encounter (Signed)
Called and left a 2nd message requesting a call back.

## 2021-12-25 ENCOUNTER — Other Ambulatory Visit: Payer: Medicare HMO

## 2021-12-29 ENCOUNTER — Ambulatory Visit: Payer: Medicare HMO | Admitting: Urology

## 2022-01-01 ENCOUNTER — Other Ambulatory Visit: Payer: Medicare HMO

## 2022-01-01 DIAGNOSIS — C61 Malignant neoplasm of prostate: Secondary | ICD-10-CM

## 2022-01-02 LAB — PSA: Prostate Specific Ag, Serum: 7.4 ng/mL — ABNORMAL HIGH (ref 0.0–4.0)

## 2022-01-04 ENCOUNTER — Inpatient Hospital Stay: Admission: RE | Admit: 2022-01-04 | Payer: Medicare HMO | Source: Ambulatory Visit

## 2022-01-05 ENCOUNTER — Ambulatory Visit: Payer: Medicare HMO | Admitting: Urology

## 2022-01-13 ENCOUNTER — Ambulatory Visit (INDEPENDENT_AMBULATORY_CARE_PROVIDER_SITE_OTHER): Payer: Medicare HMO | Admitting: Urology

## 2022-01-13 DIAGNOSIS — C61 Malignant neoplasm of prostate: Secondary | ICD-10-CM | POA: Diagnosis not present

## 2022-01-13 NOTE — Progress Notes (Signed)
I, Jeanmarie Hubert Maxie,acting as a scribe for Hollice Espy, MD.,have documented all relevant documentation on the behalf of Hollice Espy, MD,as directed by  Hollice Espy, MD while in the presence of Hollice Espy, MD.   01/13/22 5:10 PM   [name] Sep 17, 1955 [id number]  Referring provider: Frazier Richards, [place],  [place] [address]  Chief Complaint  Patient presents with   Elevated PSA    HPI: 66 year-old male with a personal history of prostate cancer on active surveillance who returns today for his overdue 6 months follow-up.   He was diagnosed 09/2018 with prostate cancer. Pathology revealed 3 cores of Gleason 3+3 ranging up to 50% (left lateral base) of the tissue.  He has had a few cores of high-grade PIN and suspicious but nondiagnostic biopsy cores.  He had an MRI of the prostate on 06/15/21 that showed PI-RADS 3 lesion on the left apical mid gland but no extracapsular extension. Prostate volume was estimated at 54, non lymphadenopathy or any other evidence of aggressive disease.     His TRUS volume on 06/28/2019 67.3 g.  His most recent PSA was 7.4 on 01/01/22.  His wife is still living. She's still struggling with chemotherapy, not doing well. He's had a lot of other additional medical problems this year, including shoulder injury, diverticulitis, valve replacement, etc.   PSA trend:   Prostate Specific Ag, Serum  Latest Ref Rng 0.0 - 4.0 ng/mL  10/06/2017 4.6 (H)   01/09/2018 6.7 (H)   03/06/2018 5.3 (H)   07/10/2018 6.6 (H)   06/27/2019 5.4 (H)   09/26/2020 7.1 (H)   03/26/2021 8.4 (H)   01/01/2022 7.4 (H)      PMH: Past Medical History:  Diagnosis Date   Anxiety    BPH with obstruction/lower urinary tract symptoms    Colon polyp    Multiple   Complication of anesthesia    wakes up during anesthesia   DDD (degenerative disc disease), lumbar    Diverticulitis    Diverticulosis    Elevated PSA    Fatty liver 01/22/2015   Mild, noted  on [place] Abd   Hepatic cyst 06/08/2016   Left, noted on CT renal   Hepatic hemangioma 02/03/2015   Small, noted on MRI Abd   History of colitis    History of kidney stones    Hypercholesteremia    Hypertension    Hypothyroidism    Major depression    Moderate aortic stenosis 05/24/2017   Noted on ECHO   Near syncope 04/25/2017   due to stress   OA (osteoarthritis)    knees, back, hands   Pre-diabetes    PVC (premature ventricular contraction)    Stroke ([place]) 2015   TIA/right side affected, right side has improved   Wears dentures    partial upper, front    Surgical History: Past Surgical History:  Procedure Laterality Date   AORTIC VALVE REPLACEMENT N/A 04/16/2021   Procedure: AORTIC VALVE REPLACEMENT (AVR) USING EDWARDS INSPIRIS AORTIC VALVE 25MM;  Surgeon: Gaye Pollack, MD;  Location: [place];  Service: Open Heart Surgery;  Laterality: N/A;   [place], 2001   [place] L5-S1   COLONOSCOPY     [place]   COLONOSCOPY WITH PROPOFOL N/A 01/21/2017   Procedure: COLONOSCOPY WITH PROPOFOL;  Surgeon: [name], MD;  Location: [place];  Service: Endoscopy;  Laterality: N/A;   FLEXIBLE SIGMOIDOSCOPY N/A 07/20/2021   Procedure: FLEXIBLE  SIGMOIDOSCOPY;  Surgeon: [name], MD;  Location: [facility] ENDOSCOPY;  Service: Endoscopy;  Laterality: N/A;   POLYPECTOMY  01/21/2017   Procedure: POLYPECTOMY;  Surgeon: [name], MD;  Location: [place];  Service: Endoscopy;;   RIGHT HEART CATH AND CORONARY ANGIOGRAPHY N/A 03/10/2021   Procedure: RIGHT HEART CATH AND CORONARY ANGIOGRAPHY;  Surgeon: Nelva Bush, MD;  Location: [place] CV LAB;  Service: Cardiovascular;  Laterality: N/A;   TEE WITHOUT CARDIOVERSION N/A 04/16/2021   Procedure: TRANSESOPHAGEAL ECHOCARDIOGRAM (TEE);  Surgeon: Gaye Pollack, MD;  Location: [place];  Service: Open Heart Surgery;  Laterality: N/A;   TOTAL KNEE ARTHROPLASTY Right 04/07/2018   Procedure: TOTAL KNEE ARTHROPLASTY;   Surgeon: Earlie Server, MD;  Location: [facility];  Service: Orthopedics;  Laterality: Right;    Home Medications:  Allergies as of 01/13/2022       Reactions   Codeine Nausea And Vomiting   Ivp Dye [iodinated Contrast Media] Nausea And Vomiting   Also felt very hot/flushed   Pravastatin Other (See Comments)   Muscle Pain   Shellfish Allergy Nausea And Vomiting        Medication List        Accurate as of January 13, 2022  5:10 PM. If you have any questions, ask your nurse or doctor.          STOP taking these medications    amiodarone 200 MG tablet Commonly known as: PACERONE       TAKE these medications    acetaminophen 500 MG tablet Commonly known as: TYLENOL Take 1,000 mg by mouth every 6 (six) hours as needed for moderate pain.   cholecalciferol 25 MCG (1000 UNIT) tablet Commonly known as: VITAMIN D3 Take 1,000 Units by mouth daily.   citalopram 20 MG tablet Commonly known as: CELEXA Take 20 mg by mouth daily.   DOCUSATE SODIUM PO Take 1 capsule by mouth daily as needed (stool softener).   DULoxetine 60 MG capsule Commonly known as: CYMBALTA Take 60 mg by mouth daily.   levothyroxine 150 MCG tablet Commonly known as: SYNTHROID Take 150 mcg by mouth daily before breakfast.   multivitamin capsule Take 1 capsule by mouth daily.   OVER THE COUNTER MEDICATION Take 1 Dose by mouth daily as needed (mouth pain). Salt Rinse   oxyCODONE 5 MG immediate release tablet Commonly known as: Oxy IR/ROXICODONE Take 1 tablet (5 mg total) by mouth every 6 (six) hours as needed for moderate pain.   oxyCODONE 5 MG immediate release tablet Commonly known as: Roxicodone Take 1 tablet (5 mg total) by mouth every 8 (eight) hours as needed.   rosuvastatin 20 MG tablet Commonly known as: CRESTOR Take 1 tablet (20 mg total) by mouth daily.   tamsulosin 0.4 MG Caps capsule Commonly known as: FLOMAX Take 1 capsule (0.4 mg total) by mouth daily.   traZODone 50 MG  tablet Commonly known as: DESYREL Take 1 tablet (50 mg total) by mouth at bedtime as needed for sleep.        Allergies:  Allergies  Allergen Reactions   Codeine Nausea And Vomiting   Ivp Dye [Iodinated Contrast Media] Nausea And Vomiting    Also felt very hot/flushed   Pravastatin Other (See Comments)    Muscle Pain   Shellfish Allergy Nausea And Vomiting    Family History: Family History  Problem Relation Age of Onset   Alzheimer's disease Mother     Social History:  reports that he has never smoked.  He has never used smokeless tobacco. He reports that he does not currently use alcohol. He reports current drug use. Drug: Marijuana.   Physical Exam: Constitutional:  Alert and oriented, No acute distress. HEENT: [place] AT, moist mucus membranes.  Trachea midline, no masses. GU: 50 gram prostate, no nodules Neurologic: Grossly intact, no focal deficits, moving all 4 extremities. Psychiatric: Normal mood and affect.  Pertinent Imaging:  EXAM: MR PROSTATE WITHOUT AND WITH CONTRAST   TECHNIQUE: Multiplanar multisequence MRI images were obtained of the pelvis centered about the prostate. Pre and post contrast images were obtained.   CONTRAST:  54m GADAVIST GADOBUTROL 1 MMOL/ML IV SOLN   COMPARISON:  07/02/2016 abdominopelvic CT.   FINDINGS: Prostate: Demonstrates mild benign prostatic hyperplasia. An anterior left mid to apical central gland nodule is somewhat ill-defined and oval-shaped at 1.4 x 1.2 cm on 47/9. Corresponds to decreased signal on ADC map 15/7 and hyperintensity on long B value diffusion-weighted image 15/8. Considered PI-RADS(v2.1)-3.   No areas of peripheral zone masslike T2 hypointensity, restricted diffusion, or early post-contrast enhancement.   Volume: 5.9 x 4.1 x 4.3 cm (volume = 54 cm^3)   Transcapsular spread:  Absent   Seminal vesicle involvement: Absent   Neurovascular bundle involvement: Absent   Pelvic adenopathy: Absent   Bone  metastasis: Absent   Other findings: No significant free fluid. Normal urinary bladder. Colonic diverticulosis.   IMPRESSION: 1. Left mid to apical anterior central gland nodule is considered PI-RADS(v2.1)-3. 2. No suspicious peripheral zone finding. 3.  No evidence of locally advanced or pelvic metastatic disease.   (I have post-processed this exam in the DynaCAD application for potential fusion-guided biopsy.)     Electronically Signed   By: KAbigail MiyamotoM.D.   On: 06/15/2021 13:25  Personally reviewed and agree with the radiologic interpretation.   Assessment & Plan:    Prostate cancer  - Low-risk on active surveillance. His PSA is actually trending back downwards. His MRI was equivalent and his rectal exam today was unremarkable. We will continue conservative management especially in light of all of his issues.   No follow-ups on file.   [id number] [place] [phone]

## 2022-02-11 ENCOUNTER — Ambulatory Visit: Payer: Medicare HMO | Attending: Cardiology | Admitting: Cardiology

## 2022-02-11 ENCOUNTER — Encounter: Payer: Self-pay | Admitting: Cardiology

## 2022-02-11 VITALS — BP 128/76 | HR 78 | Ht 70.5 in | Wt 224.0 lb

## 2022-02-11 DIAGNOSIS — Z952 Presence of prosthetic heart valve: Secondary | ICD-10-CM | POA: Diagnosis not present

## 2022-02-11 DIAGNOSIS — E782 Mixed hyperlipidemia: Secondary | ICD-10-CM

## 2022-02-11 NOTE — Progress Notes (Signed)
Cardiology Office Note:    Date:  02/11/2022   ID:  Randall Flores., DOB October 19, 1955, MRN [id number]  PCP:  Randall Richards, MD   [facility] Providers Cardiologist:  Randall Sable, MD     Referring MD: Randall Richards, MD   Chief Complaint  Patient presents with   Follow-up    6 month f/u, medication questions     History of Present Illness:    Randall Flores. is a 67 y.o. male with a hx of Severe AS s/p AVR bioprosthetic valve 03/2021, postop atrial flutter, hypertension, hyperlipidemia, anxiety who presents for follow-up.  Previously seen for dizziness and low blood pressures.  Lisinopril dose was decreased with improvement.  He still has occasional dizziness, takes lisinopril 5 mg daily.  Feels stressed and tired at home due to taking care of wife with brain cancer.  Takes Crestor as prescribed for cholesterol control.  Prior notes Echo 02/2021 EF 60 to 65%, severe aortic stenosis Left heart cath 02/2021 no angiographic significant CAD Lexiscan Myoview 01/2021 no evidence of ischemia Developed postop A-fib after aortic valve replacement.  Took Eliquis x6 weeks.  Eliquis stopped, follow-up cardiac monitor did not show any A-fib or flutter recurrence.  he has a strong family history of heart attacks, with his dad having heart attack in his 55s, several cousins and uncles on the dad side have had heart attacks.   Past Medical History:  Diagnosis Date   Anxiety    BPH with obstruction/lower urinary tract symptoms    Colon polyp    Multiple   Complication of anesthesia    wakes up during anesthesia   DDD (degenerative disc disease), lumbar    Diverticulitis    Diverticulosis    Elevated PSA    Fatty liver 01/22/2015   Mild, noted on [place] Abd   Hepatic cyst 06/08/2016   Left, noted on CT renal   Hepatic hemangioma 02/03/2015   Small, noted on MRI Abd   History of colitis    History of kidney stones    Hypercholesteremia    Hypertension    Hypothyroidism     Major depression    Moderate aortic stenosis 05/24/2017   Noted on ECHO   Near syncope 04/25/2017   due to stress   OA (osteoarthritis)    knees, back, hands   Pre-diabetes    PVC (premature ventricular contraction)    Stroke ([place]) 2015   TIA/right side affected, right side has improved   Wears dentures    partial upper, front    Past Surgical History:  Procedure Laterality Date   AORTIC VALVE REPLACEMENT N/A 04/16/2021   Procedure: AORTIC VALVE REPLACEMENT (AVR) USING EDWARDS INSPIRIS AORTIC VALVE 25MM;  Surgeon: Randall Pollack, MD;  Location: [place];  Service: Open Heart Surgery;  Laterality: N/A;   [place], 2001   [place] L5-S1   COLONOSCOPY     [place]   COLONOSCOPY WITH PROPOFOL N/A 01/21/2017   Procedure: COLONOSCOPY WITH PROPOFOL;  Surgeon: Randall Lame, MD;  Location: [place];  Service: Endoscopy;  Laterality: N/A;   FLEXIBLE SIGMOIDOSCOPY N/A 07/20/2021   Procedure: FLEXIBLE SIGMOIDOSCOPY;  Surgeon: Randall Lame, MD;  Location: [facility] ENDOSCOPY;  Service: Endoscopy;  Laterality: N/A;   POLYPECTOMY  01/21/2017   Procedure: POLYPECTOMY;  Surgeon: Randall Lame, MD;  Location: [place];  Service: Endoscopy;;   RIGHT HEART CATH AND CORONARY ANGIOGRAPHY N/A 03/10/2021   Procedure: RIGHT HEART CATH AND CORONARY  ANGIOGRAPHY;  Surgeon: Randall Bush, MD;  Location: [place] CV LAB;  Service: Cardiovascular;  Laterality: N/A;   TEE WITHOUT CARDIOVERSION N/A 04/16/2021   Procedure: TRANSESOPHAGEAL ECHOCARDIOGRAM (TEE);  Surgeon: Randall Pollack, MD;  Location: [place];  Service: Open Heart Surgery;  Laterality: N/A;   TOTAL KNEE ARTHROPLASTY Right 04/07/2018   Procedure: TOTAL KNEE ARTHROPLASTY;  Surgeon: Earlie Server, MD;  [facility];  Service: Orthopedics;  Laterality: Right;    Current Medications: Current Meds  Medication Sig   citalopram (CELEXA) 20 MG tablet Take 20 mg by mouth daily.   DULoxetine (CYMBALTA) 60 MG capsule Take  60 mg by mouth daily.   levothyroxine (SYNTHROID, LEVOTHROID) 150 MCG tablet Take 150 mcg by mouth daily before breakfast.    rosuvastatin (CRESTOR) 20 MG tablet Take 1 tablet (20 mg total) by mouth daily.   tamsulosin (FLOMAX) 0.4 MG CAPS capsule Take 1 capsule (0.4 mg total) by mouth daily.   [DISCONTINUED] lisinopril (ZESTRIL) 5 MG tablet Take 5 mg by mouth daily.     Allergies:   Codeine, Ivp dye [iodinated contrast media], Pravastatin, and Shellfish allergy   Social History   Socioeconomic History   Marital status: Married    Spouse name: Randall Flores   Number of children: 2   Years of education: Not on file   Highest education level: Not on file  Occupational History   Not on file  Tobacco Use   Smoking status: Never   Smokeless tobacco: Never  Vaping Use   Vaping Use: Never used  Substance and Sexual Activity   Alcohol use: Not Currently    Comment: 6 beers/week   Drug use: Yes    Types: Marijuana    Comment: had beg. of Feb. 2023   Sexual activity: Not on file  Other Topics Concern   Not on file  Social History Narrative   Lives at home with wife    Social Determinants of Health   Financial Resource Strain: Not on file  Food Insecurity: Not on file  Transportation Needs: Not on file  Physical Activity: Not on file  Stress: Not on file  Social Connections: Not on file     Family History: The patient's family history includes Alzheimer's disease in his mother.  ROS:   Please see the history of present illness.     All other systems reviewed and are negative.  EKGs/Labs/Other Studies Reviewed:    The following studies were reviewed today:   EKG:  EKG is ordered today.  EKG shows normal sinus rhythm, normal ECG.  Recent Labs: 04/24/2021: B Natriuretic Peptide 364.7; Magnesium 2.1 04/25/2021: TSH 3.261 07/20/2021: ALT 19 07/22/2021: BUN 12; Creatinine, Ser 1.01; Hemoglobin 10.4; Platelets 170; Potassium 3.7; Sodium 137  Recent Lipid Panel    Component  Value Date/Time   CHOL 175 06/28/2013 0624   TRIG 214 (H) 06/28/2013 0624   HDL 27 (L) 06/28/2013 0624   VLDL 43 (H) 06/28/2013 0624   LDLCALC 105 (H) 06/28/2013 0624   Outside lipid panel 01/28/2021 total cholesterol 137, HDL 37, LDL 78, triglycerides 124.  Risk Assessment/Calculations:         Physical Exam:    VS:  BP 128/76 (BP Location: Left Arm, Patient Position: Sitting, Cuff Size: Normal)   Pulse 78   Ht 5' 10.5" (1.791 m)   Wt 224 lb (101.6 kg)   SpO2 96%   BMI 31.69 kg/m     Wt Readings from Last 3 Encounters:  02/11/22  224 lb (101.6 kg)  09/18/21 213 lb (96.6 kg)  08/14/21 209 lb (94.8 kg)     GEN:  Well nourished, well developed in no acute distress HEENT: Normal NECK: No JVD; No carotid bruits CARDIAC: RRR, no murmur RESPIRATORY:  Clear to auscultation without rales, wheezing or rhonchi  ABDOMEN: Soft, non-tender, non-distended MUSCULOSKELETAL:  No edema; No deformity  SKIN: Warm and dry NEUROLOGIC:  Alert and oriented x 3 PSYCHIATRIC:  Normal affect   ASSESSMENT:    1. S/P AVR   2. Mixed hyperlipidemia    PLAN:    In order of problems listed above:  Severe aortic stenosis s/p AVR bioprosthetic valve 03/2021.  No murmur noted on exam, continue to monitor serially with echocardiograms.  Repeat echo in 3 months which will be 1 year from prior. Hyperlipidemia, continue Crestor 20 mg daily.  Repeat fasting lipid panel History of hypertension, BP control, occasional dizziness.  Stop lisinopril.  Follow-up in 6 months.    Medication Adjustments/Labs and Tests Ordered: Current medicines are reviewed at length with the patient today.  Concerns regarding medicines are outlined above.  Orders Placed This Encounter  Procedures   Lipid panel   EKG 12-Lead   ECHOCARDIOGRAM COMPLETE   No orders of the defined types were placed in this encounter.   Patient Instructions  Medication Instructions:   STOP lisinopril  *If you need a refill on your cardiac  medications before your next appointment, please call your pharmacy*   Lab Work:  Your physician recommends that you return for lab work at your earliest convenience at the medical mall. You will need to be fasting.  No appt is needed. Hours are M-F 7AM- 6 PM.  If you have labs (blood work) drawn today and your tests are completely normal, you will receive your results only by: [place] (if you have MyChart) OR A paper copy in the mail If you have any lab test that is abnormal or we need to change your treatment, we will call you to review the results.   Testing/Procedures:  Echocardiogram - in 3 months  Your physician has requested that you have an echocardiogram. Echocardiography is a painless test that uses sound waves to create images of your heart. It provides your doctor with information about the size and shape of your heart and how well your heart's chambers and valves are working. This procedure takes approximately one hour. There are no restrictions for this procedure. Please note; depending on visual quality an IV may need to be placed.     Follow-Up: At [facility], you and your health needs are our priority.  As part of our continuing mission to provide you with exceptional heart care, we have created designated Provider Care Teams.  These Care Teams include your primary Cardiologist (physician) and Advanced Practice Providers (APPs -  Physician Assistants and Nurse Practitioners) who all work together to provide you with the care you need, when you need it.  We recommend signing up for the patient portal called "MyChart".  Sign up information is provided on this After Visit Summary.  MyChart is used to connect with patients for Virtual Visits (Telemedicine).  Patients are able to view lab/test results, encounter notes, upcoming appointments, etc.  Non-urgent messages can be sent to your provider as well.   To learn more about what you can do with MyChart, go  to [email or web address].    Your next appointment:   6 month(s)  Provider:   You may  see Randall Sable, MD or one of the following Advanced Practice Providers on your designated Care Team:   Murray Hodgkins, NP Christell Faith, PA-C Cadence Kathlen Mody, PA-C Gerrie Nordmann, NP   Signed, Randall Sable, MD  02/11/2022 11:05 AM    [place]

## 2022-02-11 NOTE — Patient Instructions (Signed)
Medication Instructions:   STOP lisinopril  *If you need a refill on your cardiac medications before your next appointment, please call your pharmacy*   Lab Work:  Your physician recommends that you return for lab work at your earliest convenience at the medical mall. You will need to be fasting.  No appt is needed. Hours are M-F 7AM- 6 PM.  If you have labs (blood work) drawn today and your tests are completely normal, you will receive your results only by: [place] (if you have MyChart) OR A paper copy in the mail If you have any lab test that is abnormal or we need to change your treatment, we will call you to review the results.   Testing/Procedures:  Echocardiogram - in 3 months  Your physician has requested that you have an echocardiogram. Echocardiography is a painless test that uses sound waves to create images of your heart. It provides your doctor with information about the size and shape of your heart and how well your heart's chambers and valves are working. This procedure takes approximately one hour. There are no restrictions for this procedure. Please note; depending on visual quality an IV may need to be placed.     Follow-Up: At [facility], you and your health needs are our priority.  As part of our continuing mission to provide you with exceptional heart care, we have created designated Provider Care Teams.  These Care Teams include your primary Cardiologist (physician) and Advanced Practice Providers (APPs -  Physician Assistants and Nurse Practitioners) who all work together to provide you with the care you need, when you need it.  We recommend signing up for the patient portal called "MyChart".  Sign up information is provided on this After Visit Summary.  MyChart is used to connect with patients for Virtual Visits (Telemedicine).  Patients are able to view lab/test results, encounter notes, upcoming appointments, etc.  Non-urgent messages can be  sent to your provider as well.   To learn more about what you can do with MyChart, go to [email or web address].    Your next appointment:   6 month(s)  Provider:   You may see Kate Sable, MD or one of the following Advanced Practice Providers on your designated Care Team:   Murray Hodgkins, NP Christell Faith, PA-C Cadence Kathlen Mody, PA-C Gerrie Nordmann, NP

## 2022-05-13 ENCOUNTER — Ambulatory Visit: Payer: Medicare HMO | Attending: Cardiology

## 2022-05-13 DIAGNOSIS — Z952 Presence of prosthetic heart valve: Secondary | ICD-10-CM | POA: Diagnosis not present

## 2022-05-13 LAB — ECHOCARDIOGRAM COMPLETE
AR max vel: 2.66 cm2
AV Area VTI: 2.68 cm2
AV Area mean vel: 2.68 cm2
AV Mean grad: 5.3 mmHg
AV Peak grad: 9.4 mmHg
Ao pk vel: 1.54 m/s
Area-P 1/2: 4.15 cm2
S' Lateral: 3.1 cm

## 2022-05-13 MED ORDER — PERFLUTREN LIPID MICROSPHERE
1.0000 mL | INTRAVENOUS | Status: AC | PRN
  Administered 2022-05-13: 2 mL via INTRAVENOUS

## 2022-07-15 ENCOUNTER — Other Ambulatory Visit: Payer: Medicare HMO

## 2022-07-15 DIAGNOSIS — C61 Malignant neoplasm of prostate: Secondary | ICD-10-CM

## 2022-07-18 LAB — PSA: Prostate Specific Ag, Serum: 9.7 ng/mL — ABNORMAL HIGH (ref 0.0–4.0)

## 2022-07-28 ENCOUNTER — Other Ambulatory Visit: Payer: Self-pay

## 2022-07-28 DIAGNOSIS — C61 Malignant neoplasm of prostate: Secondary | ICD-10-CM

## 2022-10-27 ENCOUNTER — Other Ambulatory Visit: Payer: Medicare HMO

## 2022-10-27 DIAGNOSIS — C61 Malignant neoplasm of prostate: Secondary | ICD-10-CM

## 2022-10-28 LAB — PSA: Prostate Specific Ag, Serum: 35.5 ng/mL — ABNORMAL HIGH (ref 0.0–4.0)

## 2022-11-01 ENCOUNTER — Other Ambulatory Visit: Payer: Self-pay

## 2022-11-01 DIAGNOSIS — R972 Elevated prostate specific antigen [PSA]: Secondary | ICD-10-CM

## 2022-11-01 DIAGNOSIS — C61 Malignant neoplasm of prostate: Secondary | ICD-10-CM

## 2022-11-02 ENCOUNTER — Other Ambulatory Visit: Payer: Medicare HMO

## 2022-11-02 DIAGNOSIS — R972 Elevated prostate specific antigen [PSA]: Secondary | ICD-10-CM

## 2022-11-02 LAB — MICROSCOPIC EXAMINATION

## 2022-11-02 LAB — URINALYSIS, COMPLETE
Bilirubin, UA: NEGATIVE
Glucose, UA: NEGATIVE
Ketones, UA: NEGATIVE
Nitrite, UA: NEGATIVE
Protein,UA: NEGATIVE
RBC, UA: NEGATIVE
Specific Gravity, UA: 1.02 (ref 1.005–1.030)
Urobilinogen, Ur: 1 mg/dL (ref 0.2–1.0)
pH, UA: 7 (ref 5.0–7.5)

## 2022-11-03 ENCOUNTER — Telehealth: Payer: Self-pay | Admitting: Urology

## 2022-11-03 NOTE — Telephone Encounter (Signed)
Patient advised that we are waiting on urine culture results and will call once we have that finalized.

## 2022-11-03 NOTE — Telephone Encounter (Signed)
Patient called and lvm regarding results. He is unable to access his mychart, and he requests call.

## 2022-11-04 LAB — CULTURE, URINE COMPREHENSIVE

## 2022-11-06 IMAGING — DX DG CHEST 2V
2 series · 2 of 2 positions shown · non-contrast
Comparison: Chest radiograph 04/24/2021

CLINICAL DATA: Status post aortic valve replacement 04/16/2021

EXAM:
CHEST - 2 VIEW

[dg chest 2 view (1 of 2)]
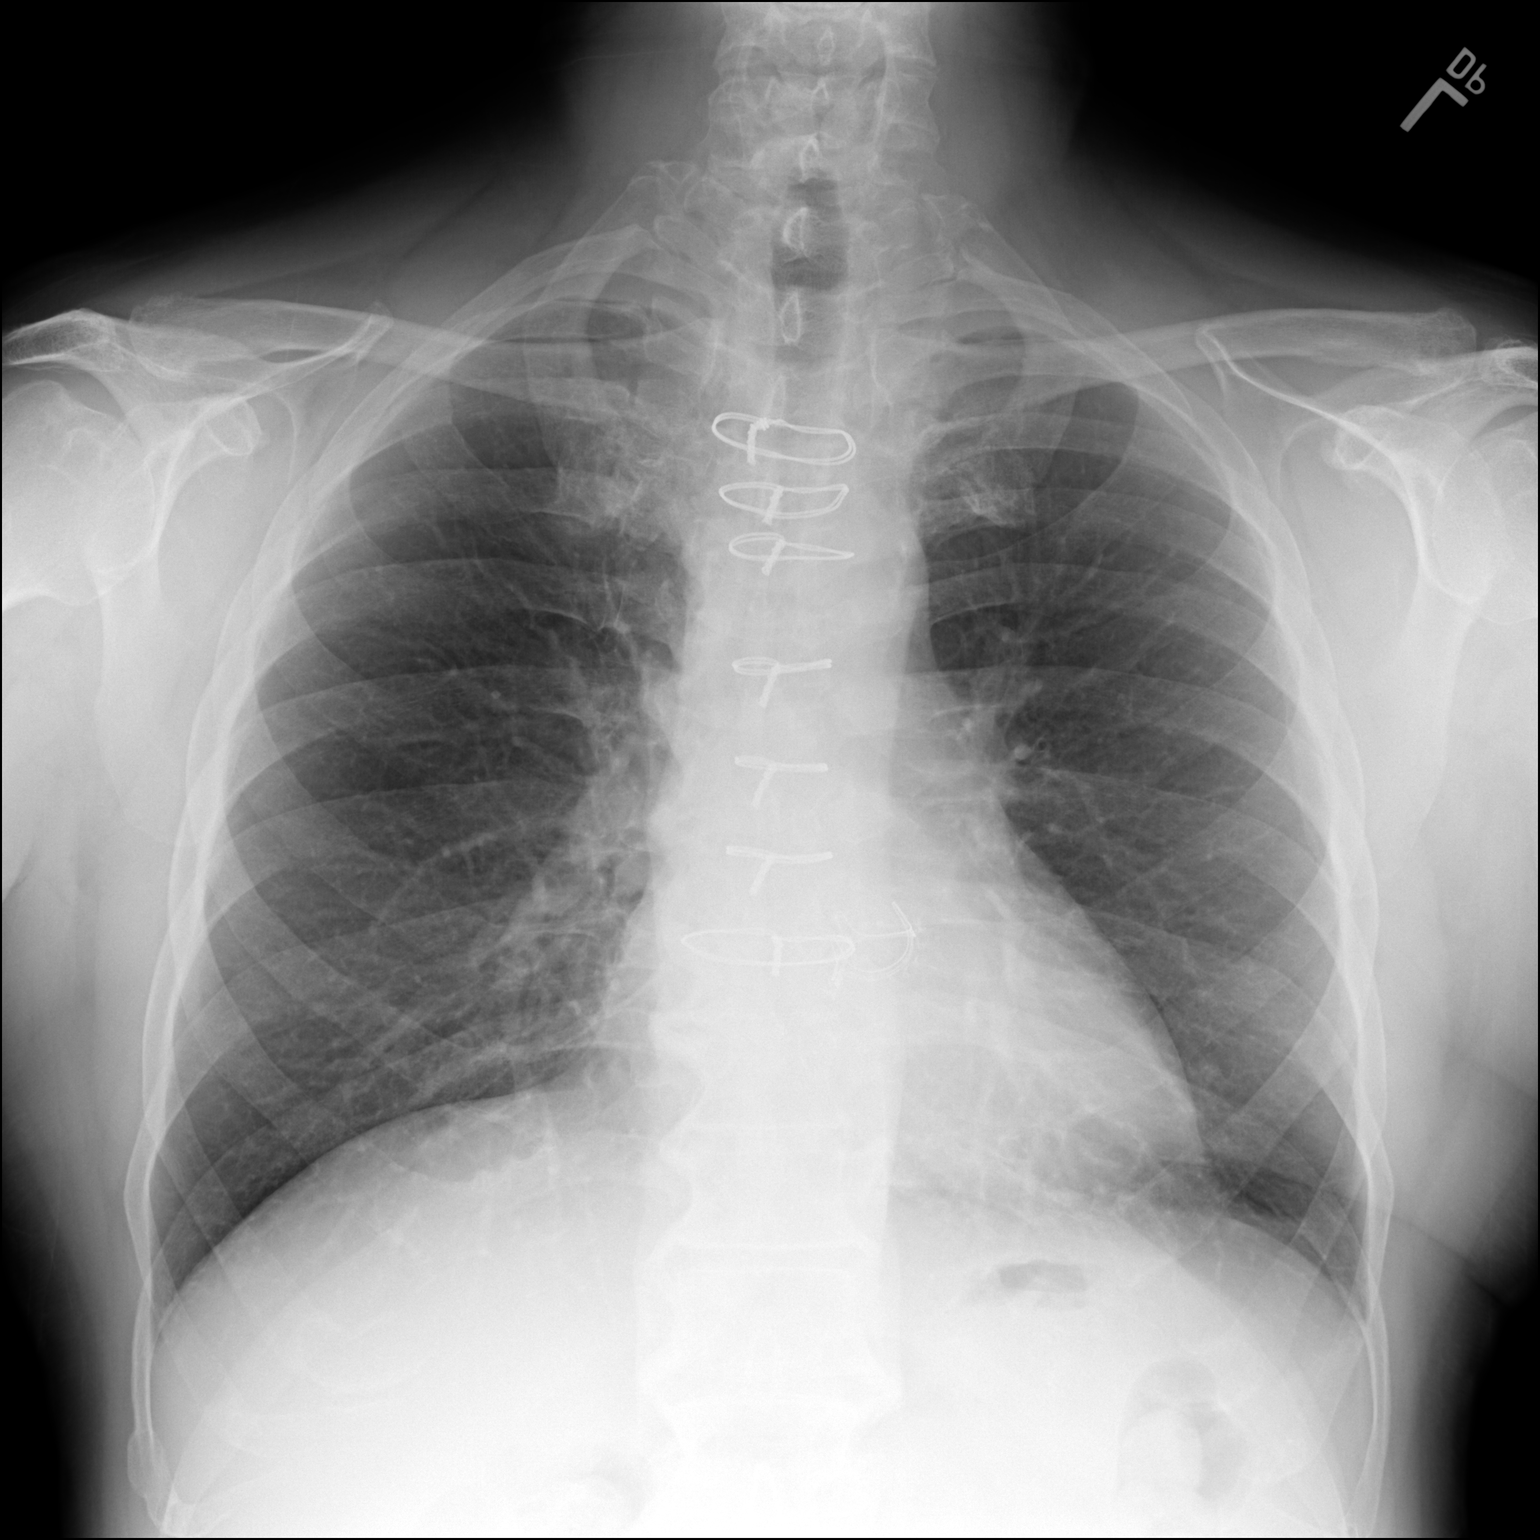

[dg chest 2 view (2 of 2)]
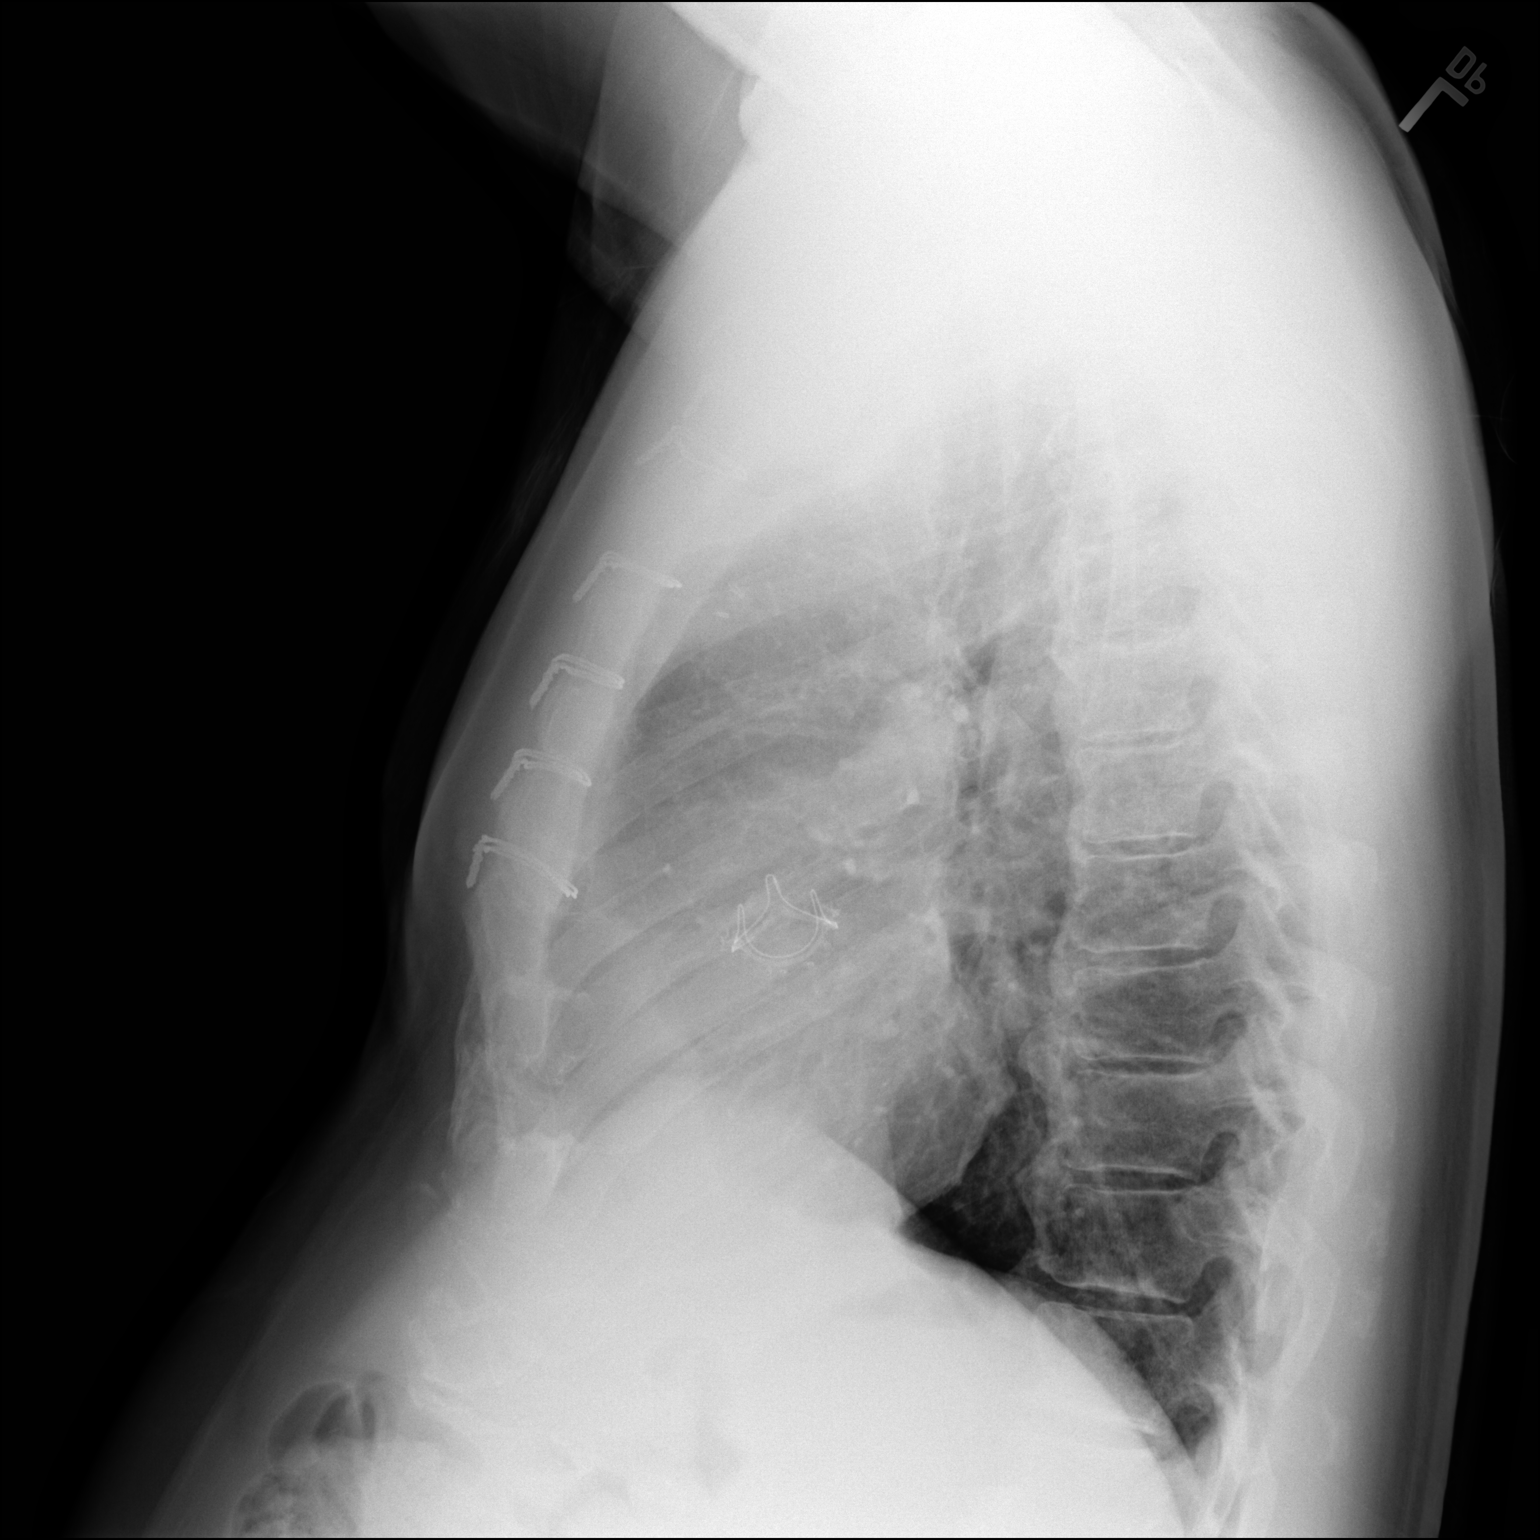

[2 of 2 positions shown; findings below may reference images not displayed]

FINDINGS: Median sternotomy wires and aortic valve prosthesis are stable. The
cardiomediastinal silhouette is stable.

There is no focal consolidation or pulmonary edema. There is no
pleural effusion or pneumothorax.

There is no acute osseous abnormality.
IMPRESSION: Stable chest with no radiographic evidence of acute cardiopulmonary
process. Stable aortic valve prosthesis.

## 2022-11-25 IMAGING — MR MR PROSTATE WO/W CM
56 series · 56 of 56 positions shown · IV contrast (9ml Gadavist)
Comparison: 07/02/2016 abdominopelvic CT.

CLINICAL DATA: Prostate cancer.  Watchful waiting.  Rising PSA.

EXAM:
MR PROSTATE WITHOUT AND WITH CONTRAST
TECHNIQUE: Multiplanar multisequence MRI images were obtained of the pelvis
centered about the prostate. Pre and post contrast images were
obtained.
CONTRAST:  9mL GADAVIST GADOBUTROL 1 MMOL/ML IV SOLN

[Series 3: ax in&out whole · axial · 6.0mm · 0.74mm/px · 1 of 35 slices shown (1 of 2)]
[im 1/35]
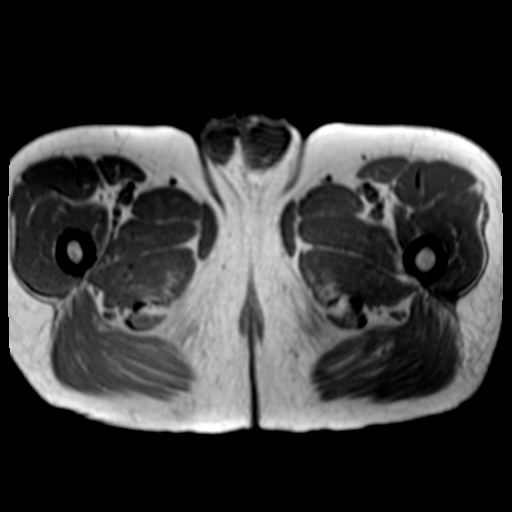

[Series 3: ax in&out whole · axial · 6.0mm · 0.74mm/px · 1 of 35 slices shown (2 of 2)]
[im 1/35]
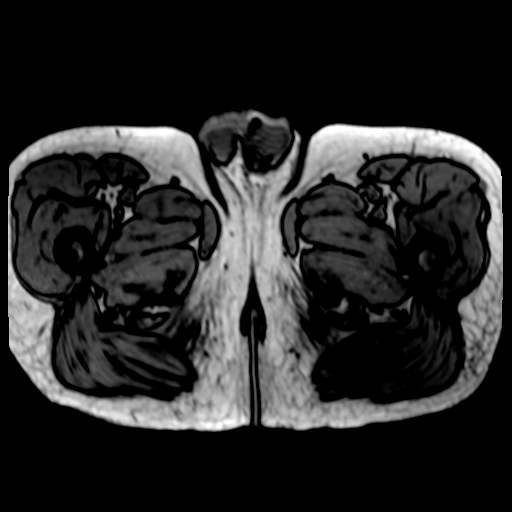

[Series 4: T2 · coronal · 3.0mm · 0.70mm/px · 1 of 35 slices shown (1 of 3)]
[im 1/35]
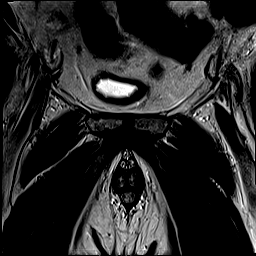

[Series 5: T2 · axial · 3.0mm · 0.56mm/px · 1 of 29 slices shown (2 of 3)]
[im 1/29]
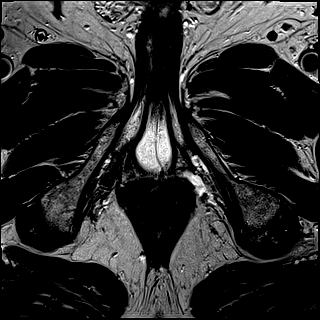

[Series 6: DWI · axial · 3.0mm · 0.86mm/px · 1 of 74 slices shown (1 of 3)]
[im 1/74]
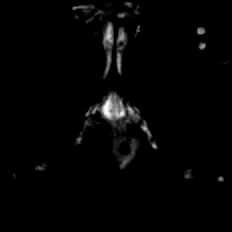

[Series 7: DWI · axial · 3.0mm · 0.86mm/px · 1 of 25 slices shown (2 of 3)]
[im 1/25]
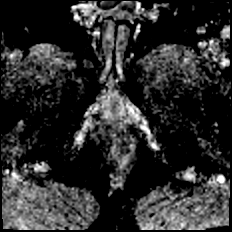

[Series 8: DWI · axial · 3.0mm · 0.86mm/px · 1 of 25 slices shown (3 of 3)]
[im 1/25]
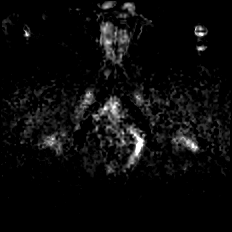

[Series 9: T2 · axial · 1.0mm · 1.04mm/px · 1 of 80 slices shown (3 of 3)]
[im 1/80]
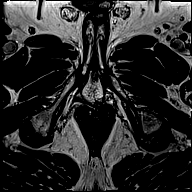

[Series 10: T1 · axial · 3.0mm · 1.15mm/px · 1 of 28 slices shown (1 of 48)]
[im 1/28]
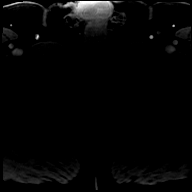

[Series 11: T1 · axial · 3.0mm · 1.15mm/px · 1 of 28 slices shown (2 of 48)]
[im 1/28]
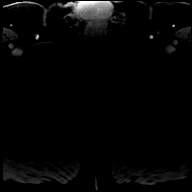

[Series 12: T1 · axial · 3.0mm · 1.15mm/px · 1 of 28 slices shown (3 of 48)]
[im 1/28]
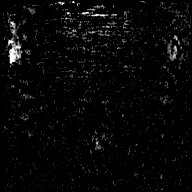

[Series 13: T1 · axial · 3.0mm · 1.15mm/px · 1 of 28 slices shown (4 of 48)]
[im 1/28]
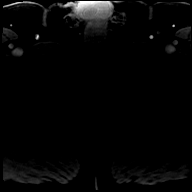

[Series 14: T1 · axial · 3.0mm · 1.15mm/px · 1 of 28 slices shown (5 of 48)]
[im 1/28]
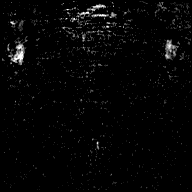

[Series 15: T1 · axial · 3.0mm · 1.15mm/px · 1 of 28 slices shown (6 of 48)]
[im 1/28]
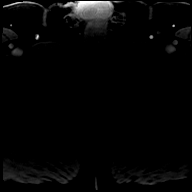

[Series 16: T1 · axial · 3.0mm · 1.15mm/px · 1 of 28 slices shown (7 of 48)]
[im 1/28]
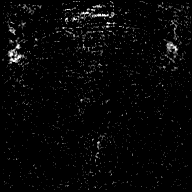

[Series 17: T1 · axial · 3.0mm · 1.15mm/px · 1 of 28 slices shown (8 of 48)]
[im 1/28]
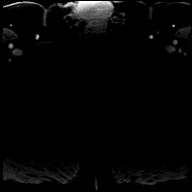

[Series 18: T1 · axial · 3.0mm · 1.15mm/px · 1 of 28 slices shown (9 of 48)]
[im 1/28]
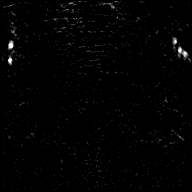

[Series 19: T1 · axial · 3.0mm · 1.15mm/px · 1 of 28 slices shown (10 of 48)]
[im 1/28]
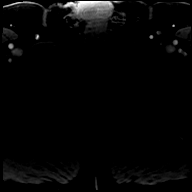

[Series 20: T1 · axial · 3.0mm · 1.15mm/px · 1 of 28 slices shown (11 of 48)]
[im 1/28]
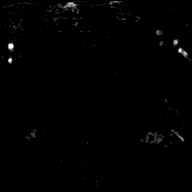

[Series 21: T1 · axial · 3.0mm · 1.15mm/px · 1 of 28 slices shown (12 of 48)]
[im 1/28]
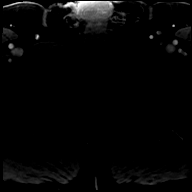

[Series 22: T1 · axial · 3.0mm · 1.15mm/px · 1 of 28 slices shown (13 of 48)]
[im 1/28]
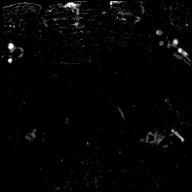

[Series 23: T1 · axial · 3.0mm · 1.15mm/px · 1 of 28 slices shown (14 of 48)]
[im 1/28]
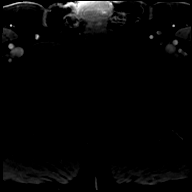

[Series 24: T1 · axial · 3.0mm · 1.15mm/px · 1 of 28 slices shown (15 of 48)]
[im 1/28]
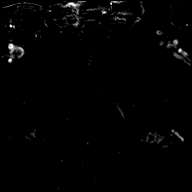

[Series 25: T1 · axial · 3.0mm · 1.15mm/px · 1 of 28 slices shown (16 of 48)]
[im 1/28]
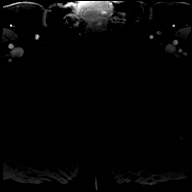

[Series 26: T1 · axial · 3.0mm · 1.15mm/px · 1 of 28 slices shown (17 of 48)]
[im 1/28]
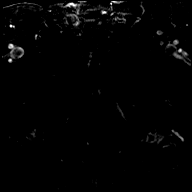

[Series 27: T1 · axial · 3.0mm · 1.15mm/px · 1 of 28 slices shown (18 of 48)]
[im 1/28]
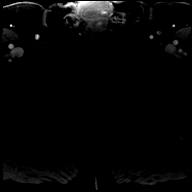

[Series 28: T1 · axial · 3.0mm · 1.15mm/px · 1 of 28 slices shown (19 of 48)]
[im 1/28]
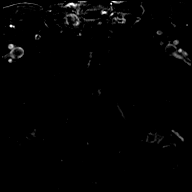

[Series 29: T1 · axial · 3.0mm · 1.15mm/px · 1 of 28 slices shown (20 of 48)]
[im 1/28]
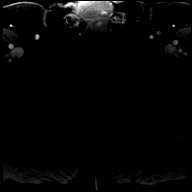

[Series 30: T1 · axial · 3.0mm · 1.15mm/px · 1 of 28 slices shown (21 of 48)]
[im 1/28]
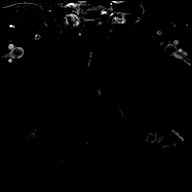

[Series 31: T1 · axial · 3.0mm · 1.15mm/px · 1 of 28 slices shown (22 of 48)]
[im 1/28]
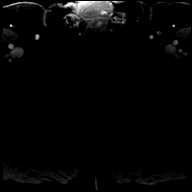

[Series 32: T1 · axial · 3.0mm · 1.15mm/px · 1 of 28 slices shown (23 of 48)]
[im 1/28]
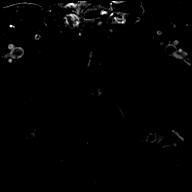

[Series 33: T1 · axial · 3.0mm · 1.15mm/px · 1 of 28 slices shown (24 of 48)]
[im 1/28]
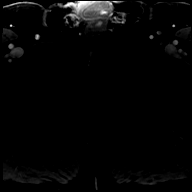

[Series 34: T1 · axial · 3.0mm · 1.15mm/px · 1 of 28 slices shown (25 of 48)]
[im 1/28]
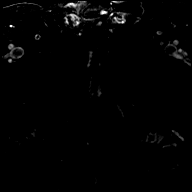

[Series 35: T1 · axial · 3.0mm · 1.15mm/px · 1 of 28 slices shown (26 of 48)]
[im 1/28]
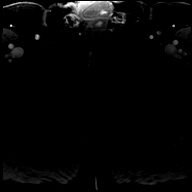

[Series 36: T1 · axial · 3.0mm · 1.15mm/px · 1 of 28 slices shown (27 of 48)]
[im 1/28]
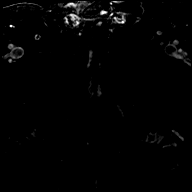

[Series 37: T1 · axial · 3.0mm · 1.15mm/px · 1 of 28 slices shown (28 of 48)]
[im 1/28]
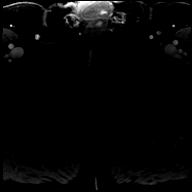

[Series 38: T1 · axial · 3.0mm · 1.15mm/px · 1 of 28 slices shown (29 of 48)]
[im 1/28]
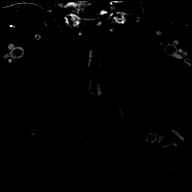

[Series 39: T1 · axial · 3.0mm · 1.15mm/px · 1 of 28 slices shown (30 of 48)]
[im 1/28]
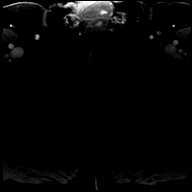

[Series 40: T1 · axial · 3.0mm · 1.15mm/px · 1 of 28 slices shown (31 of 48)]
[im 1/28]
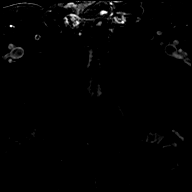

[Series 41: T1 · axial · 3.0mm · 1.15mm/px · 1 of 28 slices shown (32 of 48)]
[im 1/28]
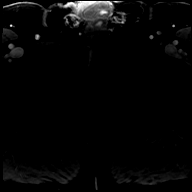

[Series 42: T1 · axial · 3.0mm · 1.15mm/px · 1 of 28 slices shown (33 of 48)]
[im 1/28]
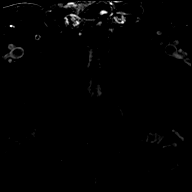

[Series 43: T1 · axial · 3.0mm · 1.15mm/px · 1 of 28 slices shown (34 of 48)]
[im 1/28]
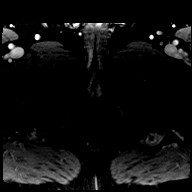

[Series 44: T1 · axial · 3.0mm · 1.15mm/px · 1 of 28 slices shown (35 of 48)]
[im 1/28]
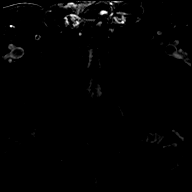

[Series 45: T1 · axial · 3.0mm · 1.15mm/px · 1 of 28 slices shown (36 of 48)]
[im 1/28]
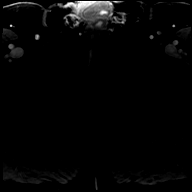

[Series 46: T1 · axial · 3.0mm · 1.15mm/px · 1 of 28 slices shown (37 of 48)]
[im 1/28]
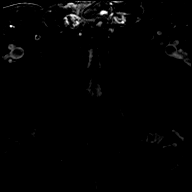

[Series 47: T1 · axial · 3.0mm · 1.15mm/px · 1 of 28 slices shown (38 of 48)]
[im 1/28]
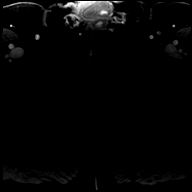

[Series 48: T1 · axial · 3.0mm · 1.15mm/px · 1 of 28 slices shown (39 of 48)]
[im 1/28]
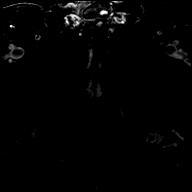

[Series 49: T1 · axial · 3.0mm · 1.15mm/px · 1 of 28 slices shown (40 of 48)]
[im 1/28]
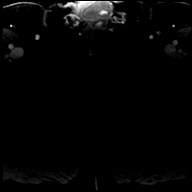

[Series 50: T1 · axial · 3.0mm · 1.15mm/px · 1 of 28 slices shown (41 of 48)]
[im 1/28]
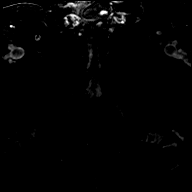

[Series 51: T1 · axial · 3.0mm · 1.15mm/px · 1 of 28 slices shown (42 of 48)]
[im 1/28]
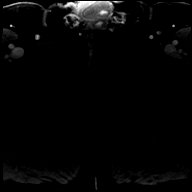

[Series 52: T1 · axial · 3.0mm · 1.15mm/px · 1 of 28 slices shown (43 of 48)]
[im 1/28]
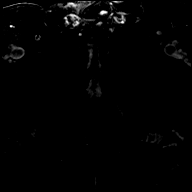

[Series 53: T1 · axial · 3.0mm · 1.15mm/px · 1 of 28 slices shown (44 of 48)]
[im 1/28]
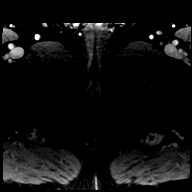

[Series 54: T1 · axial · 3.0mm · 1.15mm/px · 1 of 28 slices shown (45 of 48)]
[im 1/28]
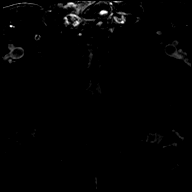

[Series 55: T1 · axial · 3.0mm · 1.15mm/px · 1 of 28 slices shown (46 of 48)]
[im 1/28]
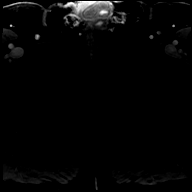

[Series 56: T1 · axial · 3.0mm · 1.15mm/px · 1 of 28 slices shown (47 of 48)]
[im 1/28]
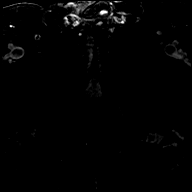

[Series 57: T1 · axial · 3.0mm · 1.15mm/px · 1 of 28 slices shown (48 of 48)]
[im 1/28]
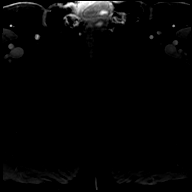

[56 of 56 positions shown; findings below may reference images not displayed]

FINDINGS: Prostate: Demonstrates mild benign prostatic hyperplasia. An
anterior left mid to apical central gland nodule is somewhat
ill-defined and oval-shaped at 1.4 x 1.2 cm on 47/9. Corresponds to
decreased signal on ADC map [DATE] and hyperintensity on long B value
diffusion-weighted image [DATE]. Considered PI-RADS(v2.1)-3.

No areas of peripheral zone masslike T2 hypointensity, restricted
diffusion, or early post-contrast enhancement.

Volume: 5.9 x 4.1 x 4.3 cm (volume = 54 cm^3)

Transcapsular spread:  Absent

Seminal vesicle involvement: Absent

Neurovascular bundle involvement: Absent

Pelvic adenopathy: Absent

Bone metastasis: Absent

Other findings: No significant free fluid. Normal urinary bladder.
Colonic diverticulosis.
IMPRESSION: 1. Left mid to apical anterior central gland nodule is considered
PI-RADS(v2.1)-3.
2. No suspicious peripheral zone finding.
3.  No evidence of locally advanced or pelvic metastatic disease.

(I have post-processed this exam in the DynaCAD application for
potential fusion-guided biopsy.)

## 2022-11-30 ENCOUNTER — Other Ambulatory Visit: Payer: Medicare HMO

## 2022-11-30 DIAGNOSIS — C61 Malignant neoplasm of prostate: Secondary | ICD-10-CM

## 2022-11-30 DIAGNOSIS — R972 Elevated prostate specific antigen [PSA]: Secondary | ICD-10-CM

## 2022-12-01 LAB — PSA: Prostate Specific Ag, Serum: 10.9 ng/mL — ABNORMAL HIGH (ref 0.0–4.0)

## 2022-12-07 ENCOUNTER — Ambulatory Visit: Payer: Medicare HMO | Admitting: Student in an Organized Health Care Education/Training Program

## 2022-12-29 LAB — COLOGUARD: COLOGUARD: NEGATIVE

## 2023-01-18 ENCOUNTER — Other Ambulatory Visit: Payer: Medicare HMO

## 2023-01-20 ENCOUNTER — Other Ambulatory Visit: Payer: Medicare HMO

## 2023-01-20 ENCOUNTER — Other Ambulatory Visit: Payer: Self-pay

## 2023-01-20 DIAGNOSIS — R972 Elevated prostate specific antigen [PSA]: Secondary | ICD-10-CM

## 2023-01-20 DIAGNOSIS — C61 Malignant neoplasm of prostate: Secondary | ICD-10-CM

## 2023-01-21 LAB — PSA: Prostate Specific Ag, Serum: 10.3 ng/mL — ABNORMAL HIGH (ref 0.0–4.0)

## 2023-02-01 ENCOUNTER — Encounter: Payer: Self-pay | Admitting: Urology

## 2023-02-01 ENCOUNTER — Ambulatory Visit: Payer: Medicare HMO | Admitting: Urology

## 2023-02-01 VITALS — BP 125/79 | HR 76 | Ht 70.5 in | Wt 220.0 lb

## 2023-02-01 DIAGNOSIS — R350 Frequency of micturition: Secondary | ICD-10-CM

## 2023-02-01 DIAGNOSIS — R972 Elevated prostate specific antigen [PSA]: Secondary | ICD-10-CM

## 2023-02-01 DIAGNOSIS — C61 Malignant neoplasm of prostate: Secondary | ICD-10-CM

## 2023-02-01 LAB — BLADDER SCAN AMB NON-IMAGING: PVR: 14 WU

## 2023-02-01 MED ORDER — OXYBUTYNIN CHLORIDE ER 10 MG PO TB24: 10.0000 mg | ORAL_TABLET | Freq: Every day | ORAL | 11 refills | Status: DC

## 2023-02-01 NOTE — Patient Instructions (Signed)
 Prostate MRI Prep:  1- No ejaculation 48 hours prior to exam  2- No caffeine or carbonated beverages on day of the exam  3- Eat light diet evening prior and day of exam  4- Avoid eating 4 hours prior to exam  5- Fleets enema needs to be done 4 hours prior to exam -See below. Can be purchased at the drug store.

## 2023-02-01 NOTE — Progress Notes (Signed)
 02/01/2023 1:49 PM   Randall Flores. [birth year] [id number]  Referring provider: Adina Buel HERO, MD [address] [place],  [place] [address]  Chief Complaint  Patient presents with   Prostate Cancer    HPI: 68 year-old male with a personal history of prostate cancer on active surveillance returns today for follow-up.   He was diagnosed 09/2018 with prostate cancer. Pathology revealed 3 cores of Gleason 3+3 ranging up to 50% (left lateral base) of the tissue.  He has had a few cores of high-grade PIN and suspicious but nondiagnostic biopsy cores.   He had an MRI of the prostate on 06/15/21 that showed PI-RADS 3 lesion on the left apical mid gland but no extracapsular extension. Prostate volume was estimated at 54, non lymphadenopathy or any other evidence of aggressive disease.     His TRUS volume on 06/28/2019 67.3 g.   His most recent PSA was 10.3 on 01/20/23, which is higher than his baseline but down from his previous.  He is doing well overall, just tired from taking care of his wife who is now in hospice care.  He reports urinary urgency and has had a few accidents.   PSA trend:    Prostate Specific Ag, Serum Latest Ref Rng 0.0 - 4.0 ng/mL 10/06/2017 4.6 (H)  01/09/2018 6.7 (H)  03/06/2018 5.3 (H)  07/10/2018 6.6 (H)  06/27/2019 5.4 (H)  09/26/2020 7.1 (H)  03/26/2021 8.4 (H)  01/01/2022 7.4 (H)  07/15/2022        9.7 10/27/2022        35.5 11/30/2022        10.9 01/20/2023      10.3  Results for orders placed or performed in visit on 02/01/23  Bladder Scan (Post Void Residual) in office  Result Value Ref Range   PVR 14.0 WU    PMH: Past Medical History:  Diagnosis Date   Anxiety    BPH with obstruction/lower urinary tract symptoms    Colon polyp    Multiple   Complication of anesthesia    wakes up during anesthesia   DDD (degenerative disc disease), lumbar    Diverticulitis    Diverticulosis    Elevated PSA    Fatty liver 01/22/2015   Mild, noted on US  Abd    Hepatic cyst 06/08/2016   Left, noted on CT renal   Hepatic hemangioma 02/03/2015   Small, noted on MRI Abd   History of colitis    History of kidney stones    Hypercholesteremia    Hypertension    Hypothyroidism    Major depression    Moderate aortic stenosis 05/24/2017   Noted on ECHO   Near syncope 04/25/2017   due to stress   OA (osteoarthritis)    knees, back, hands   Pre-diabetes    PVC (premature ventricular contraction)    Stroke (HCC) 2015   TIA/right side affected, right side has improved   Wears dentures    partial upper, front    Surgical History: Past Surgical History:  Procedure Laterality Date   AORTIC VALVE REPLACEMENT N/A 04/16/2021   Procedure: AORTIC VALVE REPLACEMENT (AVR) USING EDWARDS INSPIRIS AORTIC VALVE ;  Surgeon: Lucas Dorise POUR, MD;  Location: [facility];  Service: Open Heart Surgery;  Laterality: N/A;   BACK SURGERY  1998, 2001   [place] L5-S1   COLONOSCOPY     [place]    COLONOSCOPY WITH PROPOFOL  N/A 01/21/2017   Procedure: COLONOSCOPY WITH PROPOFOL ;  Surgeon: Jinny,  Darren, MD;  Location: [facility];  Service: Endoscopy;  Laterality: N/A;   FLEXIBLE SIGMOIDOSCOPY N/A 07/20/2021   Procedure: FLEXIBLE SIGMOIDOSCOPY;  Surgeon: Jinny Carmine, MD;  Location: [facility];  Service: Endoscopy;  Laterality: N/A;   POLYPECTOMY  01/21/2017   Procedure: POLYPECTOMY;  Surgeon: Jinny Carmine, MD;  Location: [facility];  Service: Endoscopy;;   RIGHT HEART CATH AND CORONARY ANGIOGRAPHY N/A 03/10/2021   Procedure: RIGHT HEART CATH AND CORONARY ANGIOGRAPHY;  Surgeon: Mady Bruckner, MD;  Location: [facility];  Service: Cardiovascular;  Laterality: N/A;   TEE WITHOUT CARDIOVERSION N/A 04/16/2021   Procedure: TRANSESOPHAGEAL ECHOCARDIOGRAM (TEE);  Surgeon: Lucas Dorise POUR, MD;  Location: [facility];  Service: Open Heart Surgery;  Laterality: N/A;   TOTAL KNEE ARTHROPLASTY Right 04/07/2018   Procedure: TOTAL KNEE ARTHROPLASTY;  Surgeon:  Shari Sieving, MD;  Location: [facility];  Service: Orthopedics;  Laterality: Right;    Home Medications:  Allergies as of 02/01/2023       Reactions   Codeine Nausea And Vomiting   Ivp Dye [iodinated Contrast Media] Nausea And Vomiting   Also felt very hot/flushed   Pravastatin Other (See Comments)   Muscle Pain   Shellfish Allergy Nausea And Vomiting        Medication List        Accurate as of February 01, 2023  1:49 PM. If you have any questions, ask your nurse or doctor.          acetaminophen  500 MG tablet Commonly known as: TYLENOL  Take 1,000 mg by mouth every 6 (six) hours as needed for moderate pain.   cholecalciferol 25 MCG (1000 UNIT) tablet Commonly known as: VITAMIN D3 Take 1,000 Units by mouth daily.   citalopram  20 MG tablet Commonly known as: CELEXA  Take 20 mg by mouth daily.   DOCUSATE SODIUM  PO Take 1 capsule by mouth daily as needed (stool softener).   DULoxetine  60 MG capsule Commonly known as: CYMBALTA  Take 60 mg by mouth daily.   levothyroxine  150 MCG tablet Commonly known as: SYNTHROID  Take 150 mcg by mouth daily before breakfast.   multivitamin capsule Take 1 capsule by mouth daily.   OVER THE COUNTER MEDICATION Take 1 Dose by mouth daily as needed (mouth pain). Salt Rinse   oxybutynin  10 MG 24 hr tablet Commonly known as: DITROPAN -XL Take 1 tablet (10 mg total) by mouth daily. Started by: Rosina Riis   oxyCODONE  5 MG immediate release tablet Commonly known as: Oxy IR/ROXICODONE  Take 1 tablet (5 mg total) by mouth every 6 (six) hours as needed for moderate pain.   oxyCODONE  5 MG immediate release tablet Commonly known as: Roxicodone  Take 1 tablet (5 mg total) by mouth every 8 (eight) hours as needed.   rosuvastatin  20 MG tablet Commonly known as: CRESTOR  Take 1 tablet (20 mg total) by mouth daily.   tamsulosin  0.4 MG Caps capsule Commonly known as: FLOMAX  Take 1 capsule (0.4 mg total) by mouth daily.   traZODone  50 MG  tablet Commonly known as: DESYREL  Take 1 tablet (50 mg total) by mouth at bedtime as needed for sleep.        Allergies:  Allergies  Allergen Reactions   Codeine Nausea And Vomiting   Ivp Dye [Iodinated Contrast Media] Nausea And Vomiting    Also felt very hot/flushed   Pravastatin Other (See Comments)    Muscle Pain   Shellfish Allergy Nausea And Vomiting    Family History: Family History  Problem Relation  Age of Onset   Alzheimer's disease Mother     Social History:  reports that he has never smoked. He has never used smokeless tobacco. He reports that he does not currently use alcohol. He reports current drug use. Drug: Marijuana.   Physical Exam: BP 125/79   Pulse 76   Ht 5' 10.5 (1.791 m)   Wt 220 lb (99.8 kg)   BMI 31.12 kg/m   Constitutional:  Alert and oriented, No acute distress. HEENT: [place] AT, moist mucus membranes.  Trachea midline, no masses. Neurologic: Grossly intact, no focal deficits, moving all 4 extremities. Psychiatric: Normal mood and affect.   Assessment & Plan:    1. Prostate cancer  - Given his overall upward trend would recommend repeat MRI to ensure that there is nothing substantial. He is in agreement with having this scheduled soon.   2. Urinary frequency  - Continue Flomax . Also prescribed Oxybutynin  10 mg XL to assist with bladder control.  - PVR today was 14.  Return in about 3 months (around 05/02/2023) for MRI results and test of cure.  I have reviewed the above documentation for accuracy and completeness, and I agree with the above.   Rosina Riis, MD    [facility] [address], [address] [place], [place] [address] [phone]

## 2023-02-12 ENCOUNTER — Other Ambulatory Visit: Payer: Medicare HMO

## 2023-02-19 ENCOUNTER — Ambulatory Visit
Admission: RE | Admit: 2023-02-19 | Discharge: 2023-02-19 | Disposition: A | Discharge status: Home / Self Care | Payer: Medicare HMO | Source: Ambulatory Visit | Attending: Urology | Admitting: Urology

## 2023-02-19 DIAGNOSIS — C61 Malignant neoplasm of prostate: Secondary | ICD-10-CM | POA: Diagnosis present

## 2023-02-19 MED ORDER — GADOBUTROL 1 MMOL/ML IV SOLN
10.0000 mL | Freq: Once | INTRAVENOUS | Status: AC | PRN
  Administered 2023-02-19: 10 mL via INTRAVENOUS

## 2023-02-28 ENCOUNTER — Encounter: Payer: Self-pay | Admitting: Urology

## 2023-03-01 NOTE — Telephone Encounter (Signed)
Patient called back regarding his results and would like to proceed with scheduling the Fusion Biopsy. Please advise patient.

## 2023-03-01 NOTE — Telephone Encounter (Signed)
Patient advised, information sent via mychart message and appointment made.

## 2023-03-18 ENCOUNTER — Encounter: Payer: Self-pay | Admitting: Cardiology

## 2023-03-18 ENCOUNTER — Ambulatory Visit: Payer: Medicare HMO | Attending: Cardiology | Admitting: Cardiology

## 2023-03-18 VITALS — BP 136/78 | HR 72 | Ht 70.0 in | Wt 213.2 lb

## 2023-03-18 DIAGNOSIS — I10 Essential (primary) hypertension: Secondary | ICD-10-CM

## 2023-03-18 DIAGNOSIS — E782 Mixed hyperlipidemia: Secondary | ICD-10-CM

## 2023-03-18 DIAGNOSIS — Z952 Presence of prosthetic heart valve: Secondary | ICD-10-CM

## 2023-03-18 NOTE — Progress Notes (Signed)
Cardiology Office Note:    Date:  03/18/2023   ID:  Randall Shellhammer., DOB [birth year], MRN [id number]  PCP:  Abram Sander, MD   [facility] Providers Cardiologist:  Debbe Odea, MD     Referring MD: Abram Sander, MD   Chief Complaint  Patient presents with   Follow-up    Patient reports feeling exhausted.      History of Present Illness:    Randall Hands. is a 68 y.o. male with a hx of Severe AS s/p AVR bioprosthetic valve 03/2021, postop atrial flutter, hypertension, hyperlipidemia, anxiety who presents for follow-up.  Patient's wife has metastatic breast cancer.  He has been taking care off his wife 24/7 without much sleep.  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  Endorses feeling fatigued and tired all the time.  Recently started on losartan for BP control.   Prior notes Echo 02/2021 EF 60 to 65%, severe aortic stenosis Left heart cath 02/2021 no angiographic significant CAD Lexiscan Myoview 01/2021 no evidence of ischemia Developed postop A-fib after aortic valve replacement.  Took Eliquis x6 weeks.  Eliquis stopped, follow-up cardiac monitor did not show any A-fib or flutter recurrence.  he has a strong family history of heart attacks, with his dad having heart attack in his 32s, several cousins and uncles on the dad side have had heart attacks.   Past Medical History:  Diagnosis Date   Anxiety    BPH with obstruction/lower urinary tract symptoms    Colon polyp    Multiple   Complication of anesthesia    wakes up during anesthesia   DDD (degenerative disc disease), lumbar    Diverticulitis    Diverticulosis    Elevated PSA    Fatty liver 01/22/2015   Mild, noted on [place] Abd   Hepatic cyst 06/08/2016   Left, noted on CT renal   Hepatic hemangioma 02/03/2015   Small, noted on MRI Abd   History of colitis    History of kidney stones    Hypercholesteremia    Hypertension    Hypothyroidism    Major depression    Moderate aortic stenosis 05/24/2017    Noted on ECHO   Near syncope 04/25/2017   due to stress   OA (osteoarthritis)    knees, back, hands   Pre-diabetes    PVC (premature ventricular contraction)    Stroke (HCC) 2015   TIA/right side affected, right side has improved   Wears dentures    partial upper, front    Past Surgical History:  Procedure Laterality Date   AORTIC VALVE REPLACEMENT N/A 04/16/2021   Procedure: AORTIC VALVE REPLACEMENT (AVR) USING EDWARDS INSPIRIS AORTIC VALVE ;  Surgeon: Alleen Borne, MD;  Location: [facility] OR;  Service: Open Heart Surgery;  Laterality: N/A;   BACK SURGERY  1998, 2001   [place] L5-S1   COLONOSCOPY     [place]   COLONOSCOPY WITH PROPOFOL N/A 01/21/2017   Procedure: COLONOSCOPY WITH PROPOFOL;  Surgeon: Midge Minium, MD;  Location: [facility];  Service: Endoscopy;  Laterality: N/A;   FLEXIBLE SIGMOIDOSCOPY N/A 07/20/2021   Procedure: FLEXIBLE SIGMOIDOSCOPY;  Surgeon: Midge Minium, MD;  Location: [facility] ENDOSCOPY;  Service: Endoscopy;  Laterality: N/A;   POLYPECTOMY  01/21/2017   Procedure: POLYPECTOMY;  Surgeon: Midge Minium, MD;  Location: [facility];  Service: Endoscopy;;   RIGHT HEART CATH AND CORONARY ANGIOGRAPHY N/A 03/10/2021   Procedure: RIGHT HEART CATH AND CORONARY ANGIOGRAPHY;  Surgeon: Yvonne Kendall, MD;  Location: [facility] INVASIVE CV LAB;  Service: Cardiovascular;  Laterality: N/A;   TEE WITHOUT CARDIOVERSION N/A 04/16/2021   Procedure: TRANSESOPHAGEAL ECHOCARDIOGRAM (TEE);  Surgeon: Alleen Borne, MD;  Location: [facility] OR;  Service: Open Heart Surgery;  Laterality: N/A;   TOTAL KNEE ARTHROPLASTY Right 04/07/2018   Procedure: TOTAL KNEE ARTHROPLASTY;  Surgeon: Frederico Hamman, MD;  Location: [facility] ORS;  Service: Orthopedics;  Laterality: Right;    Current Medications: Current Meds  Medication Sig   acetaminophen (TYLENOL) 500 MG tablet Take 1,000 mg by mouth every 6 (six) hours as needed for moderate pain.   Cholecalciferol (VITAMIN D3) 1.25 MG (50000 UT)  CAPS Take 1 capsule by mouth once a week.   DULoxetine HCl 40 MG CPEP Take 80 mg by mouth at bedtime.   levothyroxine (SYNTHROID) 112 MCG tablet Take 112 mcg by mouth daily before breakfast.   losartan (COZAAR) 25 MG tablet Take 25 mg by mouth daily.   Multiple Vitamin (MULTIVITAMIN) capsule Take 1 capsule by mouth daily.   rosuvastatin (CRESTOR) 20 MG tablet Take 1 tablet (20 mg total) by mouth daily.   tamsulosin (FLOMAX) 0.4 MG CAPS capsule Take 1 capsule (0.4 mg total) by mouth daily.   traMADol (ULTRAM) 50 MG tablet Take 50 mg by mouth 2 (two) times daily as needed.   traZODone (DESYREL) 50 MG tablet Take 1 tablet (50 mg total) by mouth at bedtime as needed for sleep.     Allergies:   Codeine, Ivp dye [iodinated contrast media], Pravastatin, and Shellfish allergy   Social History   Socioeconomic History   Marital status: Married    Spouse name: Randall Flores   Number of children: 2   Years of education: Not on file   Highest education level: Not on file  Occupational History   Not on file  Tobacco Use   Smoking status: Never   Smokeless tobacco: Never  Vaping Use   Vaping status: Never Used  Substance and Sexual Activity   Alcohol use: Not Currently    Comment: 6 beers/week   Drug use: Yes    Types: Marijuana    Comment: had beg. of Feb. 2023   Sexual activity: Not on file  Other Topics Concern   Not on file  Social History Narrative   Lives at home with wife    Social Drivers of Health   Financial Resource Strain: Not on file  Food Insecurity: Not on file  Transportation Needs: Not on file  Physical Activity: Not on file  Stress: Not on file  Social Connections: Not on file     Family History: The patient's family history includes Alzheimer's disease in his mother.  ROS:   Please see the history of present illness.     All other systems reviewed and are negative.  EKGs/Labs/Other Studies Reviewed:    The following studies were reviewed today:   EKG  Interpretation Date/Time:  Friday March 18 2023 15:31:00 EST Ventricular Rate:  72 PR Interval:  208 QRS Duration:  88 QT Interval:  392 QTC Calculation: 429 R Axis:   62  Text Interpretation: Normal sinus rhythm Normal ECG Confirmed by Debbe Odea ([id number]) on 03/18/2023 3:37:43 PM    Recent Labs: No results found for requested labs within last 365 days.  Recent Lipid Panel    Component Value Date/Time   CHOL 175 06/28/2013 0624   TRIG 214 (H) 06/28/2013 0624   HDL 27 (L) 06/28/2013 0624   VLDL 43 (H) 06/28/2013 0102  LDLCALC 105 (H) 06/28/2013 1610   Outside lipid panel 01/28/2021 total cholesterol 137, HDL 37, LDL 78, triglycerides 124.  Risk Assessment/Calculations:         Physical Exam:    VS:  BP 136/78 (BP Location: Left Arm, Patient Position: Sitting, Cuff Size: Normal)   Pulse 72   Ht 5\' 10"  (1.778 m)   Wt 213 lb 3.2 oz (96.7 kg)   SpO2 99%   BMI 30.59 kg/m     Wt Readings from Last 3 Encounters:  03/18/23 213 lb 3.2 oz (96.7 kg)  02/01/23 220 lb (99.8 kg)  02/11/22 224 lb (101.6 kg)     GEN:  Well nourished, well developed in no acute distress HEENT: Normal NECK: No JVD; No carotid bruits CARDIAC: RRR, no murmur RESPIRATORY:  Clear to auscultation without rales, wheezing or rhonchi  ABDOMEN: Soft, non-tender, non-distended MUSCULOSKELETAL:  No edema; No deformity  SKIN: Warm and dry NEUROLOGIC:  Alert and oriented x 3 PSYCHIATRIC:  Normal affect   ASSESSMENT:    1. S/P AVR   2. Mixed hyperlipidemia   3. Primary hypertension    PLAN:    In order of problems listed above:  Severe aortic stenosis s/p AVR bioprosthetic valve 03/2021.  Repeat echo 04/2022 EF 60 to 65%, normal functioning aortic valve prosthesis.  Symptoms of fatigue are secondary to lack of sleep and 24-hour care of wife who has metastatic breast cancer. Hyperlipidemia, cholesterol controlled.  Continue Crestor 20 mg daily.   History of hypertension, BP controlled.   Continue losartan 25 mg daily.  Follow-up in 12 months.    Medication Adjustments/Labs and Tests Ordered: Current medicines are reviewed at length with the patient today.  Concerns regarding medicines are outlined above.  Orders Placed This Encounter  Procedures   EKG 12-Lead   No orders of the defined types were placed in this encounter.   Patient Instructions  Medication Instructions:   None Ordered  *If you need a refill on your cardiac medications before your next appointment, please call your pharmacy*   Lab Work:  None Ordered  If you have labs (blood work) drawn today and your tests are completely normal, you will receive your results only by: MyChart Message (if you have MyChart) OR A paper copy in the mail If you have any lab test that is abnormal or we need to change your treatment, we will call you to review the results.   Testing/Procedures:  None Ordered   Follow-Up: At [facility], you and your health needs are our priority.  As part of our continuing mission to provide you with exceptional heart care, we have created designated Provider Care Teams.  These Care Teams include your primary Cardiologist (physician) and Advanced Practice Providers (APPs -  Physician Assistants and Nurse Practitioners) who all work together to provide you with the care you need, when you need it.  We recommend signing up for the patient portal called "MyChart".  Sign up information is provided on this After Visit Summary.  MyChart is used to connect with patients for Virtual Visits (Telemedicine).  Patients are able to view lab/test results, encounter notes, upcoming appointments, etc.  Non-urgent messages can be sent to your provider as well.   To learn more about what you can do with MyChart, go to [email or web address].    Your next appointment:   1 year(s)  Provider:   You may see Debbe Odea, MD or one of the following Advanced Practice Providers on your  designated Care Team:   Nicolasa Ducking, NP Eula Listen, PA-C Cadence Fransico Michael, PA-C Charlsie Quest, NP Carlos Levering, NP {I    Signed, Debbe Odea, MD  03/18/2023 3:49 PM    [facility]

## 2023-03-18 NOTE — Patient Instructions (Signed)
Medication Instructions:   None Ordered  *If you need a refill on your cardiac medications before your next appointment, please call your pharmacy*   Lab Work:  None Ordered  If you have labs (blood work) drawn today and your tests are completely normal, you will receive your results only by: MyChart Message (if you have MyChart) OR A paper copy in the mail If you have any lab test that is abnormal or we need to change your treatment, we will call you to review the results.   Testing/Procedures:  None Ordered   Follow-Up: At [facility], you and your health needs are our priority.  As part of our continuing mission to provide you with exceptional heart care, we have created designated Provider Care Teams.  These Care Teams include your primary Cardiologist (physician) and Advanced Practice Providers (APPs -  Physician Assistants and Nurse Practitioners) who all work together to provide you with the care you need, when you need it.  We recommend signing up for the patient portal called "MyChart".  Sign up information is provided on this After Visit Summary.  MyChart is used to connect with patients for Virtual Visits (Telemedicine).  Patients are able to view lab/test results, encounter notes, upcoming appointments, etc.  Non-urgent messages can be sent to your provider as well.   To learn more about what you can do with MyChart, go to [email or web address].    Your next appointment:   1 year(s)  Provider:   You may see Debbe Odea, MD or one of the following Advanced Practice Providers on your designated Care Team:   Nicolasa Ducking, NP Eula Listen, PA-C Cadence Fransico Michael, PA-C Charlsie Quest, NP Carlos Levering, NP {I

## 2023-03-29 ENCOUNTER — Telehealth: Payer: Self-pay

## 2023-03-29 NOTE — Telephone Encounter (Signed)
 Per pt want to schedule colonoscopy with his procedure with Dr. Richardo Hanks  . Pt is having bx on 04-13-2023. I explained to pt that I wasn't sure if that would be possible but would send message to schedulers.

## 2023-03-29 NOTE — Telephone Encounter (Signed)
 Spoken to patient and inform that both procedure cannot be completed at the same day or time.  Patient verbalized understanding.  He will call back later to schedule his colonoscopy.

## 2023-04-13 ENCOUNTER — Ambulatory Visit: Payer: Medicare HMO | Admitting: Urology

## 2023-04-13 VITALS — BP 160/98 | HR 79 | Ht 70.0 in | Wt 213.0 lb

## 2023-04-13 DIAGNOSIS — C61 Malignant neoplasm of prostate: Secondary | ICD-10-CM

## 2023-04-13 DIAGNOSIS — Z792 Long term (current) use of antibiotics: Secondary | ICD-10-CM

## 2023-04-13 DIAGNOSIS — Z2989 Encounter for other specified prophylactic measures: Secondary | ICD-10-CM

## 2023-04-13 DIAGNOSIS — R9389 Abnormal findings on diagnostic imaging of other specified body structures: Secondary | ICD-10-CM

## 2023-04-13 MED ORDER — LEVOFLOXACIN 500 MG PO TABS
500.0000 mg | ORAL_TABLET | Freq: Once | ORAL | Status: AC
  Administered 2023-04-13: 500 mg via ORAL

## 2023-04-13 MED ORDER — GENTAMICIN SULFATE 40 MG/ML IJ SOLN
80.0000 mg | Freq: Once | INTRAMUSCULAR | Status: AC
  Administered 2023-04-13: 80 mg via INTRAMUSCULAR

## 2023-04-13 NOTE — Progress Notes (Addendum)
   04/13/23  Indication: Low risk prostate cancer on active surveillance, abnormal prostate MRI  MRI Fusion Prostate Biopsy Procedure   Informed consent was obtained, and we discussed the risks of bleeding and infection/sepsis. A time out was performed to ensure correct patient identity.  Pre-Procedure: - Last PSA Level: 10.3 - Gentamicin and levaquin given for antibiotic prophylaxis -Prostate measured 60 g on MRI, PSA density 0.17   Procedure: - Prostate block performed using 10 cc 1% lidocaine  - MRI fusion biopsy was performed:  ROI#1: PI-RADS 3 lesion junction of posterior medial posterior lateral peripheral zone in the left mid gland (3 cores) ROI#2: PI-RADS 4 lesion of the right posterior medial peripheral zone mid gland and apex, right posterior lateral peripheral zone at the apex (3 cores) ROI#3: PI-RADS 3 lesion left anterior transition zone (3 cores)  - Total of 9 cores taken  Post-Procedure: - Patient tolerated the procedure well - He was counseled to seek immediate medical attention if experiences significant bleeding, fevers, or severe pain - Return in one week to discuss biopsy results  Assessment/ Plan: Will follow up in 1-2 weeks to discuss pathology with Dr. Leitha Schuller, MD 04/13/2023

## 2023-04-13 NOTE — Patient Instructions (Signed)

## 2023-04-18 ENCOUNTER — Telehealth: Payer: Self-pay

## 2023-04-18 NOTE — Telephone Encounter (Signed)
 Received a call from Bill with [facility] who left a message on triage line stating that they received 3 small vials labeled ROI 1, 2 and 3 but the ROI 3 vial did not have a sample in it. I called Bill back to verify if the 3rd vial just had liquid or had white piece of special gauze we use to put samples on and just not enough sample to test or visualize? Annette Stable will check with the lab to clarify and will call us back today or tomorrow with that information.

## 2023-04-20 ENCOUNTER — Ambulatory Visit: Payer: Medicare HMO | Admitting: Urology

## 2023-04-20 NOTE — Telephone Encounter (Signed)
 Spoke with Judithe Modest, Idamae Schuller and Dr Apolinar Junes on 04/19/23 of the situation and update on the sample, BIll states there was no white gauze or any tissue in the vial #3, no extra white gauze in the other 2 vials. Lab will test ROI 1 and ROI 2 tube. Patient was rescheduled to 04/22/23 as there will be a delay in getting these results.

## 2023-04-22 ENCOUNTER — Ambulatory Visit (INDEPENDENT_AMBULATORY_CARE_PROVIDER_SITE_OTHER): Admitting: Urology

## 2023-04-22 VITALS — BP 136/84 | HR 80

## 2023-04-22 DIAGNOSIS — R972 Elevated prostate specific antigen [PSA]: Secondary | ICD-10-CM | POA: Diagnosis not present

## 2023-04-22 DIAGNOSIS — C61 Malignant neoplasm of prostate: Secondary | ICD-10-CM | POA: Diagnosis not present

## 2023-04-22 NOTE — Progress Notes (Signed)
 Marcelle Overlie Plume,acting as a scribe for Vanna Scotland, MD.,have documented all relevant documentation on the behalf of Vanna Scotland, MD,as directed by  Vanna Scotland, MD while in the presence of Vanna Scotland, MD.  04/22/23 2:48 PM   Randall Flores. [birth year] [id number]  Referring provider: Abram Sander, MD [address] [place],  [place] [address]  Chief Complaint  Patient presents with   Follow-up    prostate biopsy results.    HPI:  68 y/o male referred for follow-up of prostate cancer with a history of low-risk prostate cancer and rising PSA levels. He returns today following a fusion biopsy.  He was diagnosed 09/2018 with prostate cancer. Pathology revealed 3 cores of Gleason 3+3 ranging up to 50% (left lateral base) of the tissue.  He has had a few cores of high-grade PIN and suspicious but nondiagnostic biopsy cores.   He had an MRI of the prostate on 06/15/21 that showed PI-RADS 3 lesion on the left apical mid gland but no extracapsular extension. Prostate volume was estimated at 54, non lymphadenopathy or any other evidence of aggressive disease.     His TRUS volume on 06/28/2019 67.3 g.  In the interim, his PSA has been rising, most recently to 10.3 on 01/20/2023. A prostate MRI on 02/21/2023 showed a PIRADS IV lesion in the right peripheral zone and a PIRADS III lesion in the left peripheral zone, with no evidence of extracapsular extension or lymphadenopathy.   During the biopsy, targeted samples were taken from areas of interest, but specimens from ROI-III were not received for unclear reasons. Pathology showed an upstaging to Gleason 3+4 at ROI-II.   He has had no previous abdominal surgeries but underwent an aortic valve replacement in 2023.   He is considering genetic testing of the tumor to better understand the cancer's aggressiveness. He is also exploring the possibility of brachytherapy.  His BMI is 31.     PMH: Past Medical History:   Diagnosis Date   Anxiety    BPH with obstruction/lower urinary tract symptoms    Colon polyp    Multiple   Complication of anesthesia    wakes up during anesthesia   DDD (degenerative disc disease), lumbar    Diverticulitis    Diverticulosis    Elevated PSA    Fatty liver 01/22/2015   Mild, noted on [place] Abd   Hepatic cyst 06/08/2016   Left, noted on CT renal   Hepatic hemangioma 02/03/2015   Small, noted on MRI Abd   History of colitis    History of kidney stones    Hypercholesteremia    Hypertension    Hypothyroidism    Major depression    Moderate aortic stenosis 05/24/2017   Noted on ECHO   Near syncope 04/25/2017   due to stress   OA (osteoarthritis)    knees, back, hands   Pre-diabetes    PVC (premature ventricular contraction)    Stroke (HCC) 2015   TIA/right side affected, right side has improved   Wears dentures    partial upper, front    Surgical History: Past Surgical History:  Procedure Laterality Date   AORTIC VALVE REPLACEMENT N/A 04/16/2021   Procedure: AORTIC VALVE REPLACEMENT (AVR) USING EDWARDS INSPIRIS AORTIC VALVE ;  Surgeon: Alleen Borne, MD;  Location: [facility] OR;  Service: Open Heart Surgery;  Laterality: N/A;   BACK SURGERY  1998, 2001   [facility] L5-S1   COLONOSCOPY     [place]  COLONOSCOPY WITH PROPOFOL N/A 01/21/2017   Procedure: COLONOSCOPY WITH PROPOFOL;  Surgeon: Midge Minium, MD;  Location: [facility];  Service: Endoscopy;  Laterality: N/A;   FLEXIBLE SIGMOIDOSCOPY N/A 07/20/2021   Procedure: FLEXIBLE SIGMOIDOSCOPY;  Surgeon: Midge Minium, MD;  Location: [facility] ENDOSCOPY;  Service: Endoscopy;  Laterality: N/A;   POLYPECTOMY  01/21/2017   Procedure: POLYPECTOMY;  Surgeon: Midge Minium, MD;  Location: [facility]ERY [facility];  Service: Endoscopy;;   RIGHT HEART CATH AND CORONARY ANGIOGRAPHY N/A 03/10/2021   Procedure: RIGHT HEART CATH AND CORONARY ANGIOGRAPHY;  Surgeon: Yvonne Kendall, MD;  Location: [facility] INVASIVE CV LAB;   Service: Cardiovascular;  Laterality: N/A;   TEE WITHOUT CARDIOVERSION N/A 04/16/2021   Procedure: TRANSESOPHAGEAL ECHOCARDIOGRAM (TEE);  Surgeon: Alleen Borne, MD;  Location: [facility] OR;  Service: Open Heart Surgery;  Laterality: N/A;   TOTAL KNEE ARTHROPLASTY Right 04/07/2018   Procedure: TOTAL KNEE ARTHROPLASTY;  Surgeon: Frederico Hamman, MD;  Location: [facility] ORS;  Service: Orthopedics;  Laterality: Right;    Home Medications:  Allergies as of 04/22/2023       Reactions   Codeine Nausea And Vomiting   Ivp Dye [iodinated Contrast Media] Nausea And Vomiting   Also felt very hot/flushed   Pravastatin Other (See Comments)   Muscle Pain   Shellfish Allergy Nausea And Vomiting        Medication List        Accurate as of April 22, 2023  2:48 PM. If you have any questions, ask your nurse or doctor.          DULoxetine HCl 40 MG Cpep Take 80 mg by mouth at bedtime.   levothyroxine 112 MCG tablet Commonly known as: SYNTHROID Take 112 mcg by mouth daily before breakfast.   losartan 25 MG tablet Commonly known as: COZAAR Take 25 mg by mouth daily.   multivitamin capsule Take 1 capsule by mouth daily.   OVER THE COUNTER MEDICATION Take 1 Dose by mouth daily as needed (mouth pain). Salt Rinse   oxybutynin 10 MG 24 hr tablet Commonly known as: DITROPAN-XL Take 1 tablet (10 mg total) by mouth daily.   oxyCODONE 5 MG immediate release tablet Commonly known as: Roxicodone Take 1 tablet (5 mg total) by mouth every 8 (eight) hours as needed.   rosuvastatin 20 MG tablet Commonly known as: CRESTOR Take 1 tablet (20 mg total) by mouth daily.   tamsulosin 0.4 MG Caps capsule Commonly known as: FLOMAX Take 1 capsule (0.4 mg total) by mouth daily.   traMADol 50 MG tablet Commonly known as: ULTRAM Take 50 mg by mouth 2 (two) times daily as needed.   traZODone 50 MG tablet Commonly known as: DESYREL Take 1 tablet (50 mg total) by mouth at bedtime as needed for sleep.   Vitamin  D3 1.25 MG (50000 UT) Caps Take 1 capsule by mouth once a week.        Allergies:  Allergies  Allergen Reactions   Codeine Nausea And Vomiting   Ivp Dye [Iodinated Contrast Media] Nausea And Vomiting    Also felt very hot/flushed   Pravastatin Other (See Comments)    Muscle Pain   Shellfish Allergy Nausea And Vomiting    Family History: Family History  Problem Relation Age of Onset   Alzheimer's disease Mother     Social History:  reports that he has never smoked. He has never used smokeless tobacco. He reports that he does not currently use alcohol. He reports current drug use. Drug:  Marijuana.   Physical Exam: BP 136/84   Pulse 80   Constitutional:  Alert and oriented, No acute distress. HEENT: [place] AT, moist mucus membranes.  Trachea midline, no masses. Neurologic: Grossly intact, no focal deficits, moving all 4 extremities. Psychiatric: Normal mood and affect.   Assessment & Plan:    1. Favorable Intermediate-Risk Prostate Cancer - His prostate cancer has been upstaged to favorable intermediate-risk due to a Gleason score of 3+4.  - Options discussed include continued active surveillance, surgical removal of the prostate, or radiation therapy.  - Given his current social situation and preference (caring for terminal wife on hospice, complex family/social issues), active surveillance with a plan to reassess in six months is recommended. - Plan to obtain cancer genetics testing on the biopsy specimens if insurance approves, to better understand the tumor's genetic profile and guide future management. - Follow-up PSA test to be conducted prior to the next appointment in six months.  2. Rising PSA - His PSA has increased to 10.3, likely due to the upstaging of prostate cancer.  - Continued monitoring of PSA levels is planned, with a follow-up test before the next visit.  Return in about 6 months (around 10/23/2023) for follow up with repeat PSA prior.  I have reviewed  the above documentation for accuracy and completeness, and I agree with the above.   Vanna Scotland, MD   [facility] [address] [place], [place] [address] [phone]

## 2023-04-26 ENCOUNTER — Telehealth: Payer: Self-pay

## 2023-04-26 NOTE — Telephone Encounter (Signed)
 Submitted request for GPS for the patient via [employer] website. Order Number: [id number]

## 2023-05-03 ENCOUNTER — Ambulatory Visit: Payer: Self-pay | Admitting: Urology

## 2023-05-20 NOTE — Telephone Encounter (Signed)
 Received email from [employer] in regards to GPS report with the information below:   [id number]  Randall Flores  [facility]  September 06, 1955  "TRUE TNP/cancel due to insufficient carcinoma. No additional positive sites to test." Please notify office.

## 2023-06-26 ENCOUNTER — Emergency Department
Admission: EM | Admit: 2023-06-26 | Discharge: 2023-06-26 | Disposition: A | Discharge status: Home / Self Care | Attending: Emergency Medicine | Admitting: Emergency Medicine

## 2023-06-26 ENCOUNTER — Other Ambulatory Visit: Payer: Self-pay

## 2023-06-26 ENCOUNTER — Emergency Department

## 2023-06-26 DIAGNOSIS — R1030 Lower abdominal pain, unspecified: Secondary | ICD-10-CM

## 2023-06-26 DIAGNOSIS — E871 Hypo-osmolality and hyponatremia: Secondary | ICD-10-CM | POA: Insufficient documentation

## 2023-06-26 DIAGNOSIS — Z8546 Personal history of malignant neoplasm of prostate: Secondary | ICD-10-CM | POA: Insufficient documentation

## 2023-06-26 DIAGNOSIS — K5900 Constipation, unspecified: Secondary | ICD-10-CM | POA: Diagnosis not present

## 2023-06-26 DIAGNOSIS — K5732 Diverticulitis of large intestine without perforation or abscess without bleeding: Secondary | ICD-10-CM | POA: Insufficient documentation

## 2023-06-26 LAB — CBC
HCT: 39.2 % (ref 39.0–52.0)
Hemoglobin: 13.5 g/dL (ref 13.0–17.0)
MCH: 29.8 pg (ref 26.0–34.0)
MCHC: 34.4 g/dL (ref 30.0–36.0)
MCV: 86.5 fL (ref 80.0–100.0)
Platelets: 234 10*3/uL (ref 150–400)
RBC: 4.53 MIL/uL (ref 4.22–5.81)
RDW: 13 % (ref 11.5–15.5)
WBC: 8.7 10*3/uL (ref 4.0–10.5)
nRBC: 0 % (ref 0.0–0.2)

## 2023-06-26 LAB — COMPREHENSIVE METABOLIC PANEL WITH GFR
ALT: 13 U/L (ref 0–44)
AST: 17 U/L (ref 15–41)
Albumin: 4.3 g/dL (ref 3.5–5.0)
Alkaline Phosphatase: 68 U/L (ref 38–126)
Anion gap: 7 (ref 5–15)
BUN: 16 mg/dL (ref 8–23)
CO2: 24 mmol/L (ref 22–32)
Calcium: 8.9 mg/dL (ref 8.9–10.3)
Chloride: 98 mmol/L (ref 98–111)
Creatinine, Ser: 1.04 mg/dL (ref 0.61–1.24)
GFR, Estimated: >60 mL/min (ref >60)
Glucose, Bld: 119 mg/dL — ABNORMAL HIGH (ref 70–99)
Potassium: 3.7 mmol/L (ref 3.5–5.1)
Sodium: 129 mmol/L — ABNORMAL LOW (ref 135–145)
Total Bilirubin: 1 mg/dL (ref 0.0–1.2)
Total Protein: 7.1 g/dL (ref 6.5–8.1)

## 2023-06-26 LAB — URINALYSIS, ROUTINE W REFLEX MICROSCOPIC
Bilirubin Urine: NEGATIVE
Glucose, UA: NEGATIVE mg/dL
Hgb urine dipstick: NEGATIVE
Ketones, ur: NEGATIVE mg/dL
Leukocytes,Ua: NEGATIVE
Nitrite: NEGATIVE
Protein, ur: NEGATIVE mg/dL
Specific Gravity, Urine: 1.011 (ref 1.005–1.030)
pH: 7 (ref 5.0–8.0)

## 2023-06-26 LAB — LIPASE, BLOOD: Lipase: 56 U/L — ABNORMAL HIGH (ref 11–51)

## 2023-06-26 MED ORDER — KETOROLAC TROMETHAMINE 30 MG/ML IJ SOLN
15.0000 mg | Freq: Once | INTRAMUSCULAR | Status: AC
  Administered 2023-06-26: 15 mg via INTRAVENOUS
  Filled 2023-06-26: qty 1

## 2023-06-26 MED ORDER — ONDANSETRON HCL 4 MG/2ML IJ SOLN
4.0000 mg | Freq: Once | INTRAMUSCULAR | Status: AC
  Administered 2023-06-26: 4 mg via INTRAVENOUS
  Filled 2023-06-26: qty 2

## 2023-06-26 MED ORDER — IOHEXOL 300 MG/ML  SOLN
100.0000 mL | Freq: Once | INTRAMUSCULAR | Status: AC | PRN
  Administered 2023-06-26: 100 mL via INTRAVENOUS

## 2023-06-26 MED ORDER — MORPHINE SULFATE (PF) 4 MG/ML IV SOLN
4.0000 mg | Freq: Once | INTRAVENOUS | Status: AC
  Administered 2023-06-26: 4 mg via INTRAVENOUS
  Filled 2023-06-26: qty 1

## 2023-06-26 MED ORDER — LACTATED RINGERS IV BOLUS
1000.0000 mL | Freq: Once | INTRAVENOUS | Status: AC
  Administered 2023-06-26: 1000 mL via INTRAVENOUS

## 2023-06-26 NOTE — ED Provider Notes (Signed)
 [facility] Provider Note    Event Date/Time   First MD Initiated Contact with Patient 06/26/23 [phone]     (approximate)   History   Abdominal Pain   HPI  Randall Flores. is a 68 y.o. male who presents to the ED for evaluation of Abdominal Pain   Review a urology clinic visit from March.  History of prostate cancer, obesity, diverticulitis, bioprosthetic AVR with postop atrial fibrillation that has not yet recurred.  No persistent ACE.  Flex sig 2 years ago during admission with diverticulitis and lower GI bleeding with biopsies demonstrating ischemic colitis.  Managed supportively  Patient presents to the ED for evaluation of constipation and anorectal pain when trying to pass stool.  Reports that his wife unfortunately passed 2 or 3 weeks ago and he is doing a poor job taking care of himself.  Has not had a bowel movement in the past week, was trying this morning to pass a stool with the assistance of coffee intake but had a lot of pain so presents to the ED.  Reports lower abdominal pain and anorectal pain  No emesis, urinary changes, fever, chest pain, dyspnea or syncope.   Physical Exam   Triage Vital Signs: ED Triage Vitals  Encounter Vitals Group     BP 06/26/23 0818 104/71     Systolic BP Percentile --      Diastolic BP Percentile --      Pulse Rate 06/26/23 0818 85     Resp 06/26/23 0818 18     Temp 06/26/23 0818 98 F (36.7 C)     Temp src --      SpO2 06/26/23 0818 95 %     Weight 06/26/23 0817 210 lb (95.3 kg)     Height 06/26/23 0817 5' 10.5" (1.791 m)     Head Circumference --      Peak Flow --      Pain Score 06/26/23 0817 9     Pain Loc --      Pain Education --      Exclude from Growth Chart --     Most recent vital signs: Vitals:   06/26/23 0818  BP: 104/71  Pulse: 85  Resp: 18  Temp: 98 F (36.7 C)  SpO2: 95%    General: Awake, no distress.  CV:  Good peripheral perfusion.  Resp:  Normal effort.  Abd:  No  distention.  Diffuse lower abdominal discomfort, benign upper abdomen. MSK:  No deformity noted.  Neuro:  No focal deficits appreciated. Other:     ED Results / Procedures / Treatments   Labs (all labs ordered are listed, but only abnormal results are displayed) Labs Reviewed  LIPASE, BLOOD - Abnormal; Notable for the following components:      Result Value   Lipase 56 (*)    All other components within normal limits  COMPREHENSIVE METABOLIC PANEL WITH GFR - Abnormal; Notable for the following components:   Sodium 129 (*)    Glucose, Bld 119 (*)    All other components within normal limits  URINALYSIS, ROUTINE W REFLEX MICROSCOPIC - Abnormal; Notable for the following components:   Color, Urine COLORLESS (*)    APPearance CLEAR (*)    All other components within normal limits  CBC    EKG Sinus rhythm the rate is 69 bpm.  Normal axis.  Mild first-degree AV block without evidence of high-grade block.  No clear signs of acute ischemia.  RADIOLOGY CT abdomen/pelvis interpreted by me without signs of SBO.  Mild features of acute uncomplicated diverticulitis are present  Official radiology report(s): CT ABDOMEN PELVIS W CONTRAST Result Date: 06/26/2023 CLINICAL DATA:  eval colitis, SBO. constipation past 1 week, hx diverticulitis and colitis EXAM: CT ABDOMEN AND PELVIS WITH CONTRAST TECHNIQUE: Multidetector CT imaging of the abdomen and pelvis was performed using the standard protocol following bolus administration of intravenous contrast. RADIATION DOSE REDUCTION: This exam was performed according to the departmental dose-optimization program which includes automated exposure control, adjustment of the mA and/or kV according to patient size and/or use of iterative reconstruction technique. CONTRAST:  OMNIPAQUE  IOHEXOL  300 MG/ML  SOLN COMPARISON:  July 20, 2021 FINDINGS: Lower chest: No focal airspace consolidation or pleural effusion.Aortic valve replacement. Hepatobiliary:  Unchanged cyst in the left hepatic lobe.No radiopaque stones or wall thickening of the gallbladder. No intrahepatic or extrahepatic biliary ductal dilation. The portal veins are patent. Pancreas: No mass or main ductal dilation. No peripancreatic inflammation or fluid collection. Spleen: Normal size. No mass. Adrenals/Urinary Tract: No adrenal masses. A couple of subcentimeter hypodensities are noted in the kidneys, too small to definitively characterize, but likely small cysts. No nephrolithiasis or hydronephrosis. The urinary bladder is distended without focal abnormality. Stomach/Bowel: The stomach is decompressed without focal abnormality. Small periampullary duodenal diverticulum measuring 1.7 cm. No small bowel wall thickening or inflammation. No small bowel obstruction.Normal appendix. Sigmoid diverticulosis. Subtle inflammatory stranding about the mid sigmoid colon and a couple of diverticula in this region. No peridiverticular abscess. Mucosal hyperenhancement within the rectum. Vascular/Lymphatic: No aortic aneurysm. Scattered aortoiliac atherosclerosis. No intraabdominal or pelvic lymphadenopathy. Reproductive: Mild prostatomegaly.No free pelvic fluid. Other: No pneumoperitoneum or ascites.  Mild presacral edema. Musculoskeletal: No acute fracture or destructive lesion. Sternotomy wires. Multilevel degenerative disc disease of the spine. Thoracic DISH. IMPRESSION: 1. Subtle inflammatory stranding about a couple of diverticula in the mid sigmoid colon region, which may represent early changes of an acute, uncomplicated diverticulitis. No peridiverticular abscess or pneumoperitoneum. 2. Mild presacral edema with mucosal hyperenhancement within the decompressed rectum. Given the patient's history, this may reflect associated inflammation from constipation. Clinical correlation recommended to exclude an infectious proctitis. 3. Mild prostatomegaly. Electronically Signed   By: Rance Burrows M.D.   On:  06/26/2023 10:53    PROCEDURES and INTERVENTIONS:  Procedures  Medications  lactated ringers  bolus 1,000 mL (1,000 mLs Intravenous New Bag/Given 06/26/23 0841)  ondansetron  (ZOFRAN ) injection 4 mg (4 mg Intravenous Given 06/26/23 0837)  morphine  (PF) 4 MG/ML injection 4 mg (4 mg Intravenous Given 06/26/23 0838)  iohexol  (OMNIPAQUE ) 300 MG/ML solution 100 mL (100 mLs Intravenous Contrast Given 06/26/23 1008)  ketorolac  (TORADOL ) 30 MG/ML injection 15 mg (15 mg Intravenous Given 06/26/23 1107)     IMPRESSION / MDM / ASSESSMENT AND PLAN / ED COURSE  I reviewed the triage vital signs and the nursing notes.  Differential diagnosis includes, but is not limited to, SBO, functional constipation, stercoral colitis, diverticulitis, abscess  {Patient presents with symptoms of an acute illness or injury that is potentially life-threatening.  Patient presents to the ED with constipation, abdominal and anorectal discomfort.  Passing stool in the ED and having symptoms resolved.  Mild hyponatremia but normal CBC.  Clear urine.  CT, as above.  I offer the patient antibiotics and we discussed the benefits of these but after discussing risks and benefits he declines and prefers conservative measures.  We discussed ED return precautions.  Suitable for outpatient management  Clinical  Course as of 06/26/23 1142  Sun Jun 26, 2023  9604 Clarified IV contrast material for CT scans, was documented as an allergy precipitating flush sensation, nausea.  Likely a side effect of contrast more so than a legitimate allergy.  We discussed the possibility of an allergy though but he would be getting preceding antiemetics anyways.  He is comfortable going ahead with IV contrast, acknowledging risks [DS]  [id number] Reassessed and re-examined. No abdominal tenderness. Generally reassuring blood work. [DS]  1108 Reassessed to perform digital rectal exam.  Patient reports passing large amount of stool and now refusing digital exam.  We  discussed CT results and plan of care.  We discussed the possible indication for antibiotics in the setting of diverticulitis.  We discussed alternatives such as bowel rest, fiber, rehydration and conservative supportive measures.  He would prefer to go this route without antibiotics.  We discussed this in detail [DS]    Clinical Course User Index [DS] Arline Bennett, MD     FINAL CLINICAL IMPRESSION(S) / ED DIAGNOSES   Final diagnoses:  Constipation, unspecified constipation type  Lower abdominal pain  Diverticulitis of large intestine without perforation or abscess without bleeding     Rx / DC Orders   ED Discharge Orders     None        Note:  This document was prepared using Dragon voice recognition software and may include unintentional dictation errors.   Arline Bennett, MD 06/26/23 [phone]

## 2023-06-26 NOTE — ED Triage Notes (Signed)
 Pt comes with c/o belly pain taht is lower and then in his butthole. Pt states constipation for over week. Pt states hx of diverticulitis.  Pt states he feels weak in his chest. Pt denies any cp.

## 2023-06-26 NOTE — TOC Initial Note (Signed)
 Transition of Care (TOC) - Initial/Assessment Note    Patient Details  Name: Randall Flores. MRN: [id number] Date of Birth: July 10, 1955  Transition of Care [facility]) CM/SW Contact:    Randall Docker, RN 06/26/2023, 11:27 AM  Clinical Narrative:                  CM to patient's room regarding TOC screening assessment. CM introduced case management role and discharge care planning process. Patient verbalized understanding and agreement with screening assessment. Patient states lives alone since wife passed away on 13-Jun-2023, from breast cancer. Patient states  has 2 dogs and 1 cat--vaccinated. Per patient takes medication for depression and anxiety. Per patient, patient's daughter, Randall Flores is aware patient is at hospital. Per patient, will drive self home upon discharge. Patient denied nausea and vomiting. Per patient stated was constipation. Per patient, has bowel movement this morning since receiving medication.  Expected Discharge Plan: Home/Self Care Barriers to Discharge: Continued Medical Work up   Patient Goals and CMS Choice     Home/self care    Expected Discharge Plan and Services    Home/self care   Living arrangements for the past 2 months: Single Family Home                   Prior Living Arrangements/Services Living arrangements for the past 2 months: Single Family Home Lives with:: Pets Patient language and need for interpreter reviewed:: No Do you feel safe going back to the place where you live?: Yes      Need for Family Participation in Patient Care: Yes (Comment) Care giver support system in place?: No (comment) Current home services: DME Criminal Activity/Legal Involvement Pertinent to Current Situation/Hospitalization: No - Comment as needed  Activities of Daily Living    Per patient, is independent in ADLs  Permission Sought/Granted Permission sought to share information with : Case Manager, Family Supports Permission granted to share information with  : Yes, Verbal Permission Granted  Share Information with NAME: Randall Flores     Permission granted to share info w Relationship: Daughter  Permission granted to share info w Contact Information: yes  Emotional Assessment Appearance:: Appears stated age Attitude/Demeanor/Rapport: Engaged Affect (typically observed): Calm Orientation: : Oriented to Self, Oriented to Place, Oriented to  Time, Oriented to Situation Alcohol / Substance Use: Alcohol Use (last alcohol 06/24/2023 --4 beers,)    Admission diagnosis:  ABD pain Patient Active Problem List   Diagnosis Date Noted   Elevated troponin 07/20/2021   Paroxysmal atrial fibrillation (HCC) 07/20/2021   Colitis, ischemic (HCC)    Generalized weakness 07/19/2021   Lower GI bleed 07/19/2021   Severe sepsis (HCC) 07/19/2021   AKI (acute kidney injury) (HCC) 07/19/2021   Caregiver stress syndrome 04/25/2021   Essential hypertension 04/25/2021   Hypothyroidism 04/25/2021   Depression 04/25/2021   BPH (benign prostatic hyperplasia) 04/25/2021   Hyperlipidemia 04/25/2021   Normocytic anemia 04/25/2021   Syncope 04/24/2021   Orthostatic hypotension    S/P AVR 04/16/2021   Severe aortic stenosis 03/10/2021   Primary localized osteoarthritis of right knee 04/07/2018   Colon cancer screening    Pseudopolyposis of colon without complication (HCC)    Abdominal pain, right upper quadrant 02/06/2015   Right lower quadrant abdominal pain 02/06/2015   PCP:  Randall Boos, MD Pharmacy:   [facility] - [place], [place] - [address] [place] [address] Phone: [phone] Fax: [phone]     Social  Drivers of Health (SDOH) Social History: SDOH Screenings   Tobacco Use: Low Risk  (06/26/2023)   SDOH Interventions:     Readmission Risk Interventions     No data to display

## 2023-06-26 NOTE — Discharge Instructions (Addendum)
 As we discussed, focus on hydration and liquid diet.  Fiber content in food  MiraLAX  to help with constipation.  1-2 capfuls per dose, 1-2 times per day  Return to the ED with any worsening symptoms despite these measures

## 2023-06-26 NOTE — ED Notes (Signed)
 Pt reports having one formed BM. Pt to CT

## 2023-09-08 ENCOUNTER — Encounter: Payer: Self-pay | Admitting: Urology

## 2023-09-13 ENCOUNTER — Encounter: Payer: Self-pay | Admitting: Urology

## 2023-10-17 ENCOUNTER — Other Ambulatory Visit: Payer: Self-pay

## 2023-10-18 ENCOUNTER — Other Ambulatory Visit

## 2023-10-18 DIAGNOSIS — R972 Elevated prostate specific antigen [PSA]: Secondary | ICD-10-CM

## 2023-10-19 LAB — PSA: Prostate Specific Ag, Serum: 13.7 ng/mL — ABNORMAL HIGH (ref 0.0–4.0)

## 2023-10-25 DIAGNOSIS — C61 Malignant neoplasm of prostate: Secondary | ICD-10-CM | POA: Insufficient documentation

## 2023-10-25 NOTE — Progress Notes (Signed)
   10/31/2023 3:22 PM   Randall Flores Arloa Mickey. 02-17-1955 [id number]  Reason for visit: Follow up prostate Ca   HPI: 68 y.o. male, initial follow up with me today, previously seen by Dr. Penne, last in 04-11-2025His wife passed away in 07/06/2023-he has been unable to address his own medical concerns, he was primary caregiver Very pleasant gentleman Here today to discuss definitive prostate cancer treatment  Prior HPI: Initial diagnosis low-risk prostate Ca (dx 2020)    - GS6 in 3/12 cores   - on AS PSA uptrend to 10.3 (Dec 2024)  MRI (Jan 2025) - PIRADS 4 Right PZ, PIRADS 3 left PZ MRI biopsy (targets only) 06-May-2023) - upstaged GS 3+4 in 1/3, GS6 in 1/3 PSA 13.7 (Sept 2025)   Physical Exam: BP (!) 163/85   Pulse 79   Ht 5' 10 (1.778 m)   Wt 195 lb (88.5 kg)   BMI 27.98 kg/m    Constitutional:  Alert and oriented, No acute distress.   Laboratory Data:  Latest Reference Range & Units 10/06/17 08:35 01/09/18 14:45 03/06/18 13:35 07/10/18 15:40 02/02/19 11:50 06/27/19 11:59 09/26/20 11:24 03/26/21 13:45 01/01/22 11:30 07/15/22 13:02 10/27/22 13:24 11/30/22 10:44 01/20/23 13:16 10/18/23 11:16  Prostate Specific Ag, Serum 0.0 - 4.0 ng/mL 4.6 (H) 6.7 (H) 5.3 (H) 6.6 (H)  5.4 (H) 7.1 (H) 8.4 (H) 7.4 (H) 9.7 (H) 35.5 (H) 10.9 (H) 10.3 (H) 13.7 (H)  Prostatic Specific Antigen 0.00 - 4.00 ng/mL     4.37 (H)           (H): Data is abnormally high  Pertinent Imaging: N/A   Assessment & Plan:    Prostate cancer [facility]) Assessment & Plan: Unfavorable intermediate risk CaP, PSA 13.7  Initial diagnosis low-risk prostate Ca (dx 2020)    - GS6 in 3/12 cores MRI biopsy (targets only) 2023/05/06) - upstaged GS 3+4 in 1/3, GS6 in 1/3  I reviewed his clinical history and recent lab data.  He was upstaged back in May 06, 2023 to favorable intermediate risk disease, although now with PSA at 13.7 this technically moved him to unfavorable intermediate strata.  Further, he did not undergo systematic  biopsies earlier this year-we may not have a full picture of his total disease burden.  At this point, I would advocate for timely definitive treatment.   I had a thoughtful conversation with him today about treatment for prostate cancer.  There are 2 comparable oncologic strategies in the form of surgery versus radiation.  I described surgery in detail including the robotic radical prostatectomy and pelvic lymph node dissection.  I explained the procedure, expected outcomes, recovery and possible complications.  I think he would be a fine candidate for surgery.  I would want him to improve his aerobic tolerance, and gain cardiac clearance considering his prior CABG. for completeness, I do want him to meet with radiation oncology for expert evaluation.  I do think his significant LUTS with OAB predominance may be a factor to consider and radiation therapy.   - Referral to radiation oncology, next available - RTC in 3-4 weeks to meet with me, follow-up treatment plan, possible preoperative appointment        Penne [name], MD  [facility] [address] [place], [place] [address] [phone]

## 2023-10-25 NOTE — Assessment & Plan Note (Addendum)
 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  He was upstaged back in March to favorable intermediate risk disease, although now with PSA at 13.7 this technically moved him to unfavorable intermediate strata.  Further, he did not undergo systematic biopsies earlier this year-we may not have a full picture of his total disease burden.  At this point, I would advocate for timely definitive treatment.   I had a thoughtful conversation with him today about treatment for prostate cancer.  There are 2 comparable oncologic strategies in the form of surgery versus radiation.  I described surgery in detail including the robotic radical prostatectomy and pelvic lymph node dissection.  I explained the procedure, expected outcomes, recovery and possible complications.  I think he would be a fine candidate for surgery.  I would want him to improve his aerobic tolerance, and gain cardiac clearance considering his prior CABG. for completeness, I do want him to meet with radiation oncology for expert evaluation.  I do think his significant LUTS with OAB predominance may be a factor to consider and radiation therapy.   - Referral to radiation oncology, next available - RTC in 3-4 weeks to meet with me, follow-up treatment plan, possible preoperative appointment

## 2023-10-26 ENCOUNTER — Ambulatory Visit: Admitting: Urology

## 2023-10-27 ENCOUNTER — Ambulatory Visit: Admitting: Urology

## 2023-10-31 ENCOUNTER — Ambulatory Visit: Admitting: Urology

## 2023-10-31 VITALS — BP 163/85 | HR 79 | Ht 70.0 in | Wt 195.0 lb

## 2023-10-31 DIAGNOSIS — C61 Malignant neoplasm of prostate: Secondary | ICD-10-CM

## 2023-12-01 NOTE — Assessment & Plan Note (Addendum)
 Unfavorable intermediate risk CaP, PSA 13.7  Initial diagnosis low-risk prostate Ca (dx 2020)    - GS6 in 3/12 cores MRI biopsy (targets only) (Mar 2025) - upstaged GS 3+4 in 1/3, GS6 in 1/3, 60g gland  IPSS: 23/4 SHIM: 18  I had a thoughtful conversation with him today about treatment for prostate cancer.  He has decided to move forward with surgery. Again we reviewed the robotic radical prostatectomy and pelvic lymph node dissection surgery in detail today.  I explained the procedure, expected outcomes, recovery and possible complications.  I think he would be a fine candidate for surgery.  I would want him to improve his aerobic tolerance, and gain cardiac clearance considering his prior CABG. he said he will stop by the cardiology office today.   - Needs cardiac clearance- hx of CABG - schedule for RALP + BPLND in January 2025  -See me for preop visit within 30 days of surgery date

## 2023-12-01 NOTE — Progress Notes (Signed)
   12/08/2023 1:35 PM   Randall Flores. [birth year] [id number]  Reason for visit: Follow up prostate Ca   HPI: 68 y.o. male, initial follow up with me today, previously seen by Dr. Penne, last in Mar 2025 Follow-up today, saw him last in early October Ultimately he elected to proceed with surgery.  He did not follow through with radiation oncology evaluation, decided against.   Prior HPI: Initial diagnosis low-risk prostate Ca (dx 2020)    - GS6 in 3/12 cores   - on AS PSA uptrend to 10.3 (Dec 2024)  MRI (Jan 2025) - PIRADS 4 Right PZ, PIRADS 3 left PZ, 60g gland MRI biopsy (targets only) (Mar 2025) - upstaged GS 3+4 in 1/3, GS6 in 1/3 PSA 13.7 (Sept 2025)   IPSS: 23/4 SHIM: 18  Hx of BPH  - on Flomax , Ditropan  10mg  every day   Physical Exam: There were no vitals taken for this visit.   General: no acute distress, alert/oriented, conversational  HEENT: equal nondilated pupils CV: regular rate Lung: unlabored breathing, regular rate and rhythm  Abd: nondistended, nontender with palpation, no palpable masses  MSK: moving all extremities without issue, normal observed motor function   Laboratory Data:  Latest Reference Range & Units 10/06/17 08:35 01/09/18 14:45 03/06/18 13:35 07/10/18 15:40 02/02/19 11:50 06/27/19 11:59 09/26/20 11:24 03/26/21 13:45 01/01/22 11:30 07/15/22 13:02 10/27/22 13:24 11/30/22 10:44 01/20/23 13:16 10/18/23 11:16  Prostate Specific Ag, Serum 0.0 - 4.0 ng/mL 4.6 (H) 6.7 (H) 5.3 (H) 6.6 (H)  5.4 (H) 7.1 (H) 8.4 (H) 7.4 (H) 9.7 (H) 35.5 (H) 10.9 (H) 10.3 (H) 13.7 (H)  Prostatic Specific Antigen 0.00 - 4.00 ng/mL     4.37 (H)           (H): Data is abnormally high    Pertinent Imaging: N/A   Assessment & Plan:    Prostate cancer [facility]) Assessment & Plan: Unfavorable intermediate risk CaP, PSA 13.7  Initial diagnosis low-risk prostate Ca (dx 2020)    - GS6 in 3/12 cores MRI biopsy (targets only) (Mar 2025) - upstaged GS 3+4 in 1/3, GS6  in 1/3, 60g gland  IPSS: 23/4 SHIM: 18  I had a thoughtful conversation with him today about treatment for prostate cancer.  He has decided to move forward with surgery. Again we reviewed the robotic radical prostatectomy and pelvic lymph node dissection surgery in detail today.  I explained the procedure, expected outcomes, recovery and possible complications.  I think he would be a fine candidate for surgery.  I would want him to improve his aerobic tolerance, and gain cardiac clearance considering his prior CABG. he said he will stop by the cardiology office today.   - Needs cardiac clearance- hx of CABG - schedule for RALP + BPLND in January 2025  -See me for preop visit within 30 days of surgery date  Orders: -     Ambulatory Referral For Surgery Scheduling        Penne [name], MD  [facility] [address] [place], [place] [address] [phone]

## 2023-12-08 ENCOUNTER — Ambulatory Visit: Admitting: Urology

## 2023-12-08 DIAGNOSIS — C61 Malignant neoplasm of prostate: Secondary | ICD-10-CM

## 2023-12-12 ENCOUNTER — Other Ambulatory Visit: Payer: Self-pay

## 2023-12-12 DIAGNOSIS — C61 Malignant neoplasm of prostate: Secondary | ICD-10-CM

## 2023-12-12 NOTE — Progress Notes (Signed)
 Surgical Physician Order [facility] [place]  Dr. Georganne, MD  * Scheduling expectation : Jan 2026  *Length of Case: 4 hours  *Clearance needed: yes (cardiac, hx of CABG)  *Anticoagulation Instructions: N/A  *Aspirin  Instructions: N/A  *Post-op visit Date/Instructions:  1 week cath removal  *Diagnosis: Prostate Cancer  *Procedure: RALP + BPLND   Additional orders: 5000u subQ heparin  in pre-op  -Admit type: Observation  -Anesthesia: General  -VTE Prophylaxis Standing Order SCD's       Other:   -Standing Lab Orders Per Anesthesia    Lab other: CBC, BMP, coag panel, T&S, UA/UCx  -Standing Test orders EKG/Chest x-ray per Anesthesia       Test other:   - Medications:  Ancef  2gm IV  -Other orders:  N/A

## 2024-01-05 ENCOUNTER — Encounter: Payer: Self-pay | Admitting: *Deleted

## 2024-01-06 ENCOUNTER — Encounter: Payer: Self-pay | Admitting: Cardiology

## 2024-01-06 ENCOUNTER — Ambulatory Visit: Attending: Cardiology | Admitting: Cardiology

## 2024-01-06 VITALS — BP 130/90 | HR 73 | Ht 70.5 in | Wt 207.0 lb

## 2024-01-06 DIAGNOSIS — I10 Essential (primary) hypertension: Secondary | ICD-10-CM | POA: Diagnosis not present

## 2024-01-06 DIAGNOSIS — E782 Mixed hyperlipidemia: Secondary | ICD-10-CM

## 2024-01-06 DIAGNOSIS — Z01810 Encounter for preprocedural cardiovascular examination: Secondary | ICD-10-CM

## 2024-01-06 DIAGNOSIS — Z952 Presence of prosthetic heart valve: Secondary | ICD-10-CM

## 2024-01-06 MED ORDER — LOSARTAN POTASSIUM 50 MG PO TABS: 50.0000 mg | ORAL_TABLET | Freq: Every day | ORAL | 3 refills | Status: AC

## 2024-01-06 NOTE — Patient Instructions (Signed)
 Medication Instructions:  - INCREASE losartan to 50 mg   *If you need a refill on your cardiac medications before your next appointment, please call your pharmacy*  Lab Work: No labs ordered today  If you have labs (blood work) drawn today and your tests are completely normal, you will receive your results only by: MyChart Message (if you have MyChart) OR A paper copy in the mail If you have any lab test that is abnormal or we need to change your treatment, we will call you to review the results.  Testing/Procedures: No test ordered today   Follow-Up: At [facility], you and your health needs are our priority.  As part of our continuing mission to provide you with exceptional heart care, our providers are all part of one team.  This team includes your primary Cardiologist (physician) and Advanced Practice Providers or APPs (Physician Assistants and Nurse Practitioners) who all work together to provide you with the care you need, when you need it.  Your next appointment:   6 month(s)  Provider:   You may see Redell Cave, MD or one of the following Advanced Practice Providers on your designated Care Team:   Lonni Meager, NP Lesley Maffucci, PA-C Bernardino Bring, PA-C Cadence Fourche, PA-C Tylene Lunch, NP Barnie Hila, NP    We recommend signing up for the patient portal called MyChart.  Sign up information is provided on this After Visit Summary.  MyChart is used to connect with patients for Virtual Visits (Telemedicine).  Patients are able to view lab/test results, encounter notes, upcoming appointments, etc.  Non-urgent messages can be sent to your provider as well.   To learn more about what you can do with MyChart, go to [email or web address].

## 2024-01-06 NOTE — Progress Notes (Signed)
 Cardiology Office Note:    Date:  01/06/2024   ID:  Randall Flores., DOB 1956/01/16, MRN [id number]  PCP:  Adina Buel HERO, MD   [facility] Providers Cardiologist:  Redell Cave, MD     Referring MD: Adina Buel HERO, MD   Chief Complaint  Patient presents with   Follow-up    Follow up prior to his surgery. Patient states that he experiences fizzy feeling sometimes. Patient is feeling ok on today. Meds reviewed. Meds reviewed.     History of Present Illness:    Randall Flores. is a 68 y.o. male with a hx of Severe AS s/p AVR bioprosthetic valve 03/2021, postop atrial flutter, hypertension, hyperlipidemia, anxiety who presents for preop evaluation.  Patient has prostate cancer, currently in a robot-assisted laparoscopic radical prostatectomy with bilateral lymph node dissection next month.  Denies chest pain.  Recent echo 04/2022 showed normal systolic function, normal functioning prosthetic aortic valve.  He states his BP at home especially diastolic has been elevated, usually ranges in the 90s.   Prior notes Echo 02/2021 EF 60 to 65%, severe aortic stenosis Left heart cath 02/2021 no angiographic significant CAD Lexiscan  Myoview  01/2021 no evidence of ischemia Developed postop A-fib after aortic valve replacement.  Took Eliquis  x6 weeks.  Eliquis  stopped, follow-up cardiac monitor did not show any A-fib or flutter recurrence.  he has a strong family history of heart attacks, with his dad having heart attack in his 59s, several cousins and uncles on the dad side have had heart attacks.   Past Medical History:  Diagnosis Date   Anxiety    BPH with obstruction/lower urinary tract symptoms    Colon polyp    Multiple   Complication of anesthesia    wakes up during anesthesia   DDD (degenerative disc disease), lumbar    Diverticulitis    Diverticulosis    Elevated PSA    Fatty liver 01/22/2015   Mild, noted on US  Abd   Hepatic cyst 06/08/2016   Left, noted on CT  renal   Hepatic hemangioma 02/03/2015   Small, noted on MRI Abd   History of colitis    History of kidney stones    Hypercholesteremia    Hypertension    Hypothyroidism    Major depression    Moderate aortic stenosis 05/24/2017   Noted on ECHO   Near syncope 04/25/2017   due to stress   OA (osteoarthritis)    knees, back, hands   Pre-diabetes    PVC (premature ventricular contraction)    Stroke (HCC) 2015   TIA/right side affected, right side has improved   Wears dentures    partial upper, front    Past Surgical History:  Procedure Laterality Date   AORTIC VALVE REPLACEMENT N/A 04/16/2021   Procedure: AORTIC VALVE REPLACEMENT (AVR) USING EDWARDS INSPIRIS AORTIC VALVE ;  Surgeon: Lucas Dorise POUR, MD;  Location: [facility] OR;  Service: Open Heart Surgery;  Laterality: N/A;   BACK SURGERY  1998, 2001   [place] L5-S1   COLONOSCOPY     [place]    COLONOSCOPY WITH PROPOFOL  N/A 01/21/2017   Procedure: COLONOSCOPY WITH PROPOFOL ;  Surgeon: Jinny Carmine, MD;  Location: [facility];  Service: Endoscopy;  Laterality: N/A;   FLEXIBLE SIGMOIDOSCOPY N/A 07/20/2021   Procedure: FLEXIBLE SIGMOIDOSCOPY;  Surgeon: Jinny Carmine, MD;  Location: [facility] ENDOSCOPY;  Service: Endoscopy;  Laterality: N/A;   POLYPECTOMY  01/21/2017   Procedure: POLYPECTOMY;  Surgeon: Jinny Carmine, MD;  Location: [facility];  Service: Endoscopy;;   RIGHT HEART CATH AND CORONARY ANGIOGRAPHY N/A 03/10/2021   Procedure: RIGHT HEART CATH AND CORONARY ANGIOGRAPHY;  Surgeon: Mady Bruckner, MD;  Location: [facility] INVASIVE CV LAB;  Service: Cardiovascular;  Laterality: N/A;   TEE WITHOUT CARDIOVERSION N/A 04/16/2021   Procedure: TRANSESOPHAGEAL ECHOCARDIOGRAM (TEE);  Surgeon: Lucas Dorise POUR, MD;  Location: [facility] OR;  Service: Open Heart Surgery;  Laterality: N/A;   TOTAL KNEE ARTHROPLASTY Right 04/07/2018   Procedure: TOTAL KNEE ARTHROPLASTY;  Surgeon: Shari Sieving, MD;  Location: [facility] ORS;  Service: Orthopedics;   Laterality: Right;    Current Medications: Current Meds  Medication Sig   Cholecalciferol (VITAMIN D3) 1.25 MG (50000 UT) CAPS Take 1 capsule by mouth once a week.   DULoxetine  HCl 40 MG CPEP Take 80 mg by mouth at bedtime.   levothyroxine  (SYNTHROID ) 112 MCG tablet Take 112 mcg by mouth daily before breakfast.   OVER THE COUNTER MEDICATION Take 1 Dose by mouth daily as needed (mouth pain). Salt Rinse   oxybutynin  (DITROPAN -XL) 10 MG 24 hr tablet Take 1 tablet (10 mg total) by mouth daily.   rosuvastatin  (CRESTOR ) 20 MG tablet Take 1 tablet (20 mg total) by mouth daily.   tamsulosin  (FLOMAX ) 0.4 MG CAPS capsule Take 1 capsule (0.4 mg total) by mouth daily.   traZODone  (DESYREL ) 50 MG tablet Take 1 tablet (50 mg total) by mouth at bedtime as needed for sleep.   [DISCONTINUED] losartan (COZAAR) 25 MG tablet Take 25 mg by mouth daily.     Allergies:   Codeine, Pravastatin, and Shellfish allergy   Social History   Socioeconomic History   Marital status: Married    Spouse name: Canton Yearby   Number of children: 2   Years of education: Not on file   Highest education level: Not on file  Occupational History   Not on file  Tobacco Use   Smoking status: Never   Smokeless tobacco: Never  Vaping Use   Vaping status: Never Used  Substance and Sexual Activity   Alcohol use: Not Currently    Comment: 6 beers/week   Drug use: Yes    Types: Marijuana    Comment: had beg. of Feb. 2023   Sexual activity: Not on file  Other Topics Concern   Not on file  Social History Narrative   Lives at home with wife    Social Drivers of Health   Tobacco Use: Low Risk (01/06/2024)   Patient History    Smoking Tobacco Use: Never    Smokeless Tobacco Use: Never    Passive Exposure: Not on file  Financial Resource Strain: Not on file  Food Insecurity: Not on file  Transportation Needs: Not on file  Physical Activity: Not on file  Stress: Not on file  Social Connections: Not on file   Depression (EYV7-0): Not on file  Alcohol Screen: Not on file  Housing: Not on file  Utilities: Not on file  Health Literacy: Not on file     Family History: The patient's family history includes Alzheimer's disease in his mother.  ROS:   Please see the history of present illness.     All other systems reviewed and are negative.  EKGs/Labs/Other Studies Reviewed:    The following studies were reviewed today:   EKG Interpretation Date/Time:  Friday January 06 2024 11:11:24 EST Ventricular Rate:  73 PR Interval:  208 QRS Duration:  76 QT Interval:  398 QTC Calculation: 438 R Axis:  51  Text Interpretation: Normal sinus rhythm Low voltage QRS Confirmed by Darliss Rogue ([id number]) on 01/06/2024 11:22:03 AM    Recent Labs: 06/26/2023: ALT 13; BUN 16; Creatinine, Ser 1.04; Hemoglobin 13.5; Platelets 234; Potassium 3.7; Sodium 129  Recent Lipid Panel    Component Value Date/Time   CHOL 175 06/28/2013 0624   TRIG 214 (H) 06/28/2013 0624   HDL 27 (L) 06/28/2013 0624   VLDL 43 (H) 06/28/2013 0624   LDLCALC 105 (H) 06/28/2013 0624   Outside lipid panel 01/28/2021 total cholesterol 137, HDL 37, LDL 78, triglycerides 124.  Risk Assessment/Calculations:         Physical Exam:    VS:  BP (!) 130/90 (BP Location: Left Arm, Patient Position: Sitting, Cuff Size: Normal)   Pulse 73   Ht 5' 10.5 (1.791 m)   Wt 207 lb (93.9 kg)   SpO2 99%   BMI 29.28 kg/m     Wt Readings from Last 3 Encounters:  01/06/24 207 lb (93.9 kg)  10/31/23 195 lb (88.5 kg)  06/26/23 210 lb (95.3 kg)     GEN:  Well nourished, well developed in no acute distress HEENT: Normal NECK: No JVD; No carotid bruits CARDIAC: RRR, no murmur RESPIRATORY:  Clear to auscultation without rales, wheezing or rhonchi  ABDOMEN: Soft, non-tender, non-distended MUSCULOSKELETAL:  No edema; No deformity  SKIN: Warm and dry NEUROLOGIC:  Alert and oriented x 3 PSYCHIATRIC:  Normal affect   ASSESSMENT:    1.  Pre-procedural cardiovascular examination   2. S/P AVR   3. Mixed hyperlipidemia   4. Primary hypertension    PLAN:    In order of problems listed above:  Preprocedural evaluation, prostate surgery being, urological procedures typically deemed low risk.  Recent echocardiogram with normal EF, normal functioning bioprosthetic valve.  Patient is asymptomatic from a cardiac perspective.  Okay for urological procedure from a cardiac perspective. Severe aortic stenosis s/p AVR bioprosthetic valve 03/2021.  Repeat echo 04/2022 EF 60 to 65%, normal functioning aortic valve prosthesis.   Hyperlipidemia, cholesterol controlled.  Continue Crestor  20 mg daily.   History of hypertension, BP elevated.  Increase losartan to 50 mg daily.  Follow-up in 12 months.    Medication Adjustments/Labs and Tests Ordered: Current medicines are reviewed at length with the patient today.  Concerns regarding medicines are outlined above.  Orders Placed This Encounter  Procedures   EKG 12-Lead   Meds ordered this encounter  Medications   losartan (COZAAR) 50 MG tablet    Sig: Take 1 tablet (50 mg total) by mouth daily.    Dispense:  30 tablet    Refill:  3    Patient Instructions  Medication Instructions:  - INCREASE losartan to 50 mg   *If you need a refill on your cardiac medications before your next appointment, please call your pharmacy*  Lab Work: No labs ordered today  If you have labs (blood work) drawn today and your tests are completely normal, you will receive your results only by: MyChart Message (if you have MyChart) OR A paper copy in the mail If you have any lab test that is abnormal or we need to change your treatment, we will call you to review the results.  Testing/Procedures: No test ordered today   Follow-Up: At [facility], you and your health needs are our priority.  As part of our continuing mission to provide you with exceptional heart care, our providers are all part  of one team.  This  team includes your primary Cardiologist (physician) and Advanced Practice Providers or APPs (Physician Assistants and Nurse Practitioners) who all work together to provide you with the care you need, when you need it.  Your next appointment:   6 month(s)  Provider:   You may see Redell Cave, MD or one of the following Advanced Practice Providers on your designated Care Team:   Lonni Meager, NP Lesley Maffucci, PA-C Bernardino Bring, PA-C Cadence [place], PA-C Tylene Lunch, NP Barnie Hila, NP    We recommend signing up for the patient portal called MyChart.  Sign up information is provided on this After Visit Summary.  MyChart is used to connect with patients for Virtual Visits (Telemedicine).  Patients are able to view lab/test results, encounter notes, upcoming appointments, etc.  Non-urgent messages can be sent to your provider as well.   To learn more about what you can do with MyChart, go to [email or web address].             Signed, Redell Cave, MD  01/06/2024 12:25 PM    [facility]

## 2024-01-11 ENCOUNTER — Telehealth: Payer: Self-pay

## 2024-01-11 NOTE — Telephone Encounter (Signed)
 Per Dr. Georganne, Patient is to be scheduled for Robotic Laparoscopic Radical Prostatectomy with Bilateral Pelvic Lymph Node Dissection   Mr. Sanluis was contacted and possible surgical dates were discussed, Tuesday January 27th, 2026 was agreed upon for surgery.   Patient was directed to call [phone] between 1-3pm the day before surgery to find out surgical arrival time.  Instructions were given not to eat or drink from midnight on the night before surgery and have a driver for the day of surgery. On the surgery day patient was instructed to enter through the [facility] entrance of [facility] report the Same Day Surgery desk.   Pre-Admit Testing will be in contact via phone to set up an interview with the anesthesia team to review your history and medications prior to surgery.   Reminder of this information was sent via Mail to the patient.

## 2024-01-11 NOTE — Progress Notes (Signed)
°  Phone Number: [phone] for Surgical Coordinator Fax Number: [phone]  REQUEST FOR SURGICAL CLEARANCE      Date: 01/10/2025  Faxed to: [facility]  Surgeon: Dr. Penne Skye, MD     Date of Surgery: 02/21/2024  Operation: Robotic Laparoscopic Radical Prostatectomy with Bilateral Pelvic Lymph Node Dissection   Anesthesia Type: General   Diagnosis: Prostate Cancer  Patient Requires:   Cardiac / Vascular Clearance : Yes  Risk Assessment:    Low   []       Moderate   []     High   []           This patient is optimized for surgery  YES []       NO   []    I recommend further assessment/workup prior to surgery. YES []      NO  []   Appointment scheduled for: _______________________   Further recommendations: ____________________________________     Physician Signature:__________________________________   Printed Name: ________________________________________   Date: _________________

## 2024-01-11 NOTE — Progress Notes (Signed)
° °  [facility] Surgical Posting Form  Surgery Date: Date: 02/21/2024  Surgeon: Dr. Penne Skye, MD  Inpt ( No  )   Outpt (No)   Obs ( Yes  )   Diagnosis: C61 Prostate Cancer  -CPT: [phone], [phone]  Surgery: Robotic Laparoscopic Radical Prostatectomy with Bilateral Pelvic Lymph Node Dissection  Stop Anticoagulations: Yes and will also hold ASA  Cardiac/Medical/Pulmonary Clearance needed: yes  Clearance needed from Dr: [facility]  Clearance request sent on: Date: 01/11/2024  *Orders entered into EPIC  Date: 01/11/2024   *Case booked in EPIC  Date: 01/11/2024  *Notified pt of Surgery: Date: 01/11/2024  PRE-OP UA & CX: yes, will also obtain CBC, BMP, PTT, PT/INR, Type and Screen  *Placed into Prior Authorization Work [place] Date: 01/11/2024  Assistant/laser/rep:No

## 2024-01-18 ENCOUNTER — Ambulatory Visit: Admitting: Cardiology

## 2024-01-31 NOTE — Assessment & Plan Note (Addendum)
 Unfavorable intermediate risk CaP, PSA 13.7  Initial diagnosis low-risk prostate Ca (dx 2020)    - GS6 in 3/12 cores MRI biopsy (targets only) (Mar 2025) - upstaged GS 3+4 in 1/3, GS6 in 1/3, 60g gland  IPSS: 23/4 SHIM: 18  Again we reviewed the robotic radical prostatectomy and pelvic lymph node dissection surgery in detail today.  I explained the procedure, expected outcomes, recovery and possible complications. We reviewed the peri-operative pathway and initial recovery timeline, indwelling catheter status, inpatient admission and initial follow up.  All questions answered today.  - Proceed with RALP + BPLND on 02/21/24 - APU visit with lab work scheduled 02/14/23 - He has been eating well and staying active . I would like for him to continue aerobic exercise, daily walking, stairs-Will improve cardiopulmonary's leading up to surgery

## 2024-01-31 NOTE — H&P (View-Only) (Signed)
" ° °  02/02/2024 1:45 PM   Randall Flores Arloa Mickey. 07-Feb-1955 [id number]  Reason for visit: Follow up prostate Ca   HPI: 69 y.o. male, follow up today Pre-op visit, scheduled for RALP on 02/21/24  Received cardiac clearance 01/06/24, okay to proceed  Prior HPI: Initial diagnosis low-risk prostate Ca (dx 2020)    - GS6 in 3/12 cores   - on AS PSA uptrend to 10.3 (Dec 2024)  MRI (Jan 2025) - PIRADS 4 Right PZ, PIRADS 3 left PZ, 60g gland MRI biopsy (targets only) (Mar 2025) - upstaged GS 3+4 in 1/3, GS6 in 1/3 PSA 13.7 (Sept 2025)   IPSS: 23/4 SHIM: 18  Hx of BPH  - on Flomax , Ditropan  10mg  every day   Physical Exam: BP (!) 182/99   Pulse 77   Ht 5' 10.5 (1.791 m)   Wt 207 lb (93.9 kg)   SpO2 98%   BMI 29.28 kg/m    General: no acute distress, alert/oriented, conversational  HEENT: equal nondilated pupils CV: regular rate Lung: unlabored breathing, regular rate and rhythm  Abd: nondistended, nontender with palpation, no palpable masses  MSK: moving all extremities without issue, normal observed motor function   Laboratory Data:  Latest Reference Range & Units 10/06/17 08:35 01/09/18 14:45 03/06/18 13:35 07/10/18 15:40 02/02/19 11:50 06/27/19 11:59 09/26/20 11:24 03/26/21 13:45 01/01/22 11:30 07/15/22 13:02 10/27/22 13:24 11/30/22 10:44 01/20/23 13:16 10/18/23 11:16  Prostate Specific Ag, Serum 0.0 - 4.0 ng/mL 4.6 (H) 6.7 (H) 5.3 (H) 6.6 (H)  5.4 (H) 7.1 (H) 8.4 (H) 7.4 (H) 9.7 (H) 35.5 (H) 10.9 (H) 10.3 (H) 13.7 (H)  Prostatic Specific Antigen 0.00 - 4.00 ng/mL     4.37 (H)           (H): Data is abnormally high    Pertinent Imaging: N/A   Assessment & Plan:    Prostate cancer (HCC) Assessment & Plan: Unfavorable intermediate risk CaP, PSA 13.7  Initial diagnosis low-risk prostate Ca (dx 2020)    - GS6 in 3/12 cores MRI biopsy (targets only) (Mar 2025) - upstaged GS 3+4 in 1/3, GS6 in 1/3, 60g gland  IPSS: 23/4 SHIM: 18  Again we reviewed the robotic  radical prostatectomy and pelvic lymph node dissection surgery in detail today.  I explained the procedure, expected outcomes, recovery and possible complications. We reviewed the peri-operative pathway and initial recovery timeline, indwelling catheter status, inpatient admission and initial follow up.  All questions answered today.  - Proceed with RALP + BPLND on 02/21/24 - APU visit with lab work scheduled 02/14/23 - He has been eating well and staying active . I would like for him to continue aerobic exercise, daily walking, stairs-Will improve cardiopulmonary's leading up to surgery          Penne [name], MD  [facility] [address], [address] [place], [place] [address] [phone] "

## 2024-01-31 NOTE — Progress Notes (Signed)
" ° °  02/02/2024 1:45 PM   Randall Flores. 07-Feb-1955 [id number]  Reason for visit: Follow up prostate Ca   HPI: 69 y.o. male, follow up today Pre-op visit, scheduled for RALP on 02/21/24  Received cardiac clearance 01/06/24, okay to proceed  Prior HPI: Initial diagnosis low-risk prostate Ca (dx 2020)    - GS6 in 3/12 cores   - on AS PSA uptrend to 10.3 (Dec 2024)  MRI (Jan 2025) - PIRADS 4 Right PZ, PIRADS 3 left PZ, 60g gland MRI biopsy (targets only) (Mar 2025) - upstaged GS 3+4 in 1/3, GS6 in 1/3 PSA 13.7 (Sept 2025)   IPSS: 23/4 SHIM: 18  Hx of BPH  - on Flomax , Ditropan  10mg  every day   Physical Exam: BP (!) 182/99   Pulse 77   Ht 5' 10.5 (1.791 m)   Wt 207 lb (93.9 kg)   SpO2 98%   BMI 29.28 kg/m    General: no acute distress, alert/oriented, conversational  HEENT: equal nondilated pupils CV: regular rate Lung: unlabored breathing, regular rate and rhythm  Abd: nondistended, nontender with palpation, no palpable masses  MSK: moving all extremities without issue, normal observed motor function   Laboratory Data:  Latest Reference Range & Units 10/06/17 08:35 01/09/18 14:45 03/06/18 13:35 07/10/18 15:40 02/02/19 11:50 06/27/19 11:59 09/26/20 11:24 03/26/21 13:45 01/01/22 11:30 07/15/22 13:02 10/27/22 13:24 11/30/22 10:44 01/20/23 13:16 10/18/23 11:16  Prostate Specific Ag, Serum 0.0 - 4.0 ng/mL 4.6 (H) 6.7 (H) 5.3 (H) 6.6 (H)  5.4 (H) 7.1 (H) 8.4 (H) 7.4 (H) 9.7 (H) 35.5 (H) 10.9 (H) 10.3 (H) 13.7 (H)  Prostatic Specific Antigen 0.00 - 4.00 ng/mL     4.37 (H)           (H): Data is abnormally high    Pertinent Imaging: N/A   Assessment & Plan:    Prostate cancer (HCC) Assessment & Plan: Unfavorable intermediate risk CaP, PSA 13.7  Initial diagnosis low-risk prostate Ca (dx 2020)    - GS6 in 3/12 cores MRI biopsy (targets only) (Mar 2025) - upstaged GS 3+4 in 1/3, GS6 in 1/3, 60g gland  IPSS: 23/4 SHIM: 18  Again we reviewed the robotic  radical prostatectomy and pelvic lymph node dissection surgery in detail today.  I explained the procedure, expected outcomes, recovery and possible complications. We reviewed the peri-operative pathway and initial recovery timeline, indwelling catheter status, inpatient admission and initial follow up.  All questions answered today.  - Proceed with RALP + BPLND on 02/21/24 - APU visit with lab work scheduled 02/14/23 - He has been eating well and staying active . I would like for him to continue aerobic exercise, daily walking, stairs-Will improve cardiopulmonary's leading up to surgery          Randall [name], MD  [facility] [address], [address] [place], [place] [address] [phone] "

## 2024-02-02 ENCOUNTER — Ambulatory Visit: Admitting: Urology

## 2024-02-02 VITALS — BP 182/99 | HR 77 | Ht 70.5 in | Wt 207.0 lb

## 2024-02-02 DIAGNOSIS — C61 Malignant neoplasm of prostate: Secondary | ICD-10-CM | POA: Diagnosis not present

## 2024-02-14 ENCOUNTER — Encounter
Admission: RE | Admit: 2024-02-14 | Discharge: 2024-02-14 | Disposition: A | Discharge status: Home / Self Care | Source: Ambulatory Visit | Attending: Urology | Admitting: Urology

## 2024-02-14 ENCOUNTER — Other Ambulatory Visit: Payer: Self-pay

## 2024-02-14 HISTORY — DX: Sleep apnea, unspecified: G47.30

## 2024-02-14 NOTE — Patient Instructions (Addendum)
 Your procedure is scheduled on: Tuesday 02/21/24 To find out your arrival time, please call [phone] between 1PM - 3PM on: Monday 02/20/24 Report to the Registration Desk on the 1st floor of the [facility]. If your arrival time is 6:00 am, do not arrive before that time as the [facility] entrance doors do not open until 6:00 am.  REMEMBER: Instructions that are not followed completely may result in serious medical risk, up to and including death; or upon the discretion of your surgeon and anesthesiologist your surgery may need to be rescheduled.  Do not eat food or drink any liquidsafter midnight the night before surgery.  No gum chewing or hard candies.  One week prior to surgery: Stop Anti-inflammatories (NSAIDS) such as Advil, Aleve, Ibuprofen, Motrin, Naproxen, Naprosyn and Aspirin  based products such as Excedrin, Goody's Powder, BC Powder.  You may however, continue to take Tylenol  if needed for pain up until the day of surgery.  Stop ANY OVER THE COUNTER supplements and vitamins for at least 7 days until after surgery.  Continue taking all of your other prescription medications up until the day of surgery. You may also continue your stool softener  ON THE DAY OF SURGERY ONLY TAKE THESE MEDICATIONS WITH SIPS OF WATER :  DULoxetine  HCl 40 MG  levothyroxine  (SYNTHROID ) 112 MCG tablet  rosuvastatin  (CRESTOR ) 20 MG tablet   No Alcohol for 24 hours before or after surgery.  No Smoking including e-cigarettes for 24 hours before surgery.  No chewable tobacco products for at least 6 hours before surgery.  No nicotine patches on the day of surgery.  Do not use any recreational drugs for at least a week (preferably 2 weeks) before your surgery.  Please be advised that the combination of cocaine and anesthesia may have negative outcomes, up to and including death. If you test positive for cocaine, your surgery will be cancelled.  On the morning of surgery brush your teeth with  toothpaste and water , you may rinse your mouth with mouthwash if you wish. Do not swallow any toothpaste or mouthwash.  Use CHG Soap or wipes as directed on instruction sheet. (You can pick this up at our office in the [facility], the building to the left of the [employer], [address] at [address]  Do not shave body hair from the neck down 48 hours before surgery.  Do not wear lotions, powders, or perfumes.   Wear comfortable clothing (specific to your surgery type) to the hospital.  Do not wear jewelry, make-up, hairpins, clips or nail polish.  For welded (permanent) jewelry: bracelets, anklets, waist bands, etc.  Please have this removed prior to surgery.  If it is not removed, there is a chance that hospital personnel will need to cut it off on the day of surgery.  Contact lenses, hearing aids and dentures may not be worn into surgery.  Do not bring valuables to the hospital. [facility] is not responsible for any missing/lost belongings or valuables.   Notify your doctor if there is any change in your medical condition (cold, fever, infection).  After surgery, you can help prevent lung complications by doing breathing exercises.  Take deep breaths and cough every 1-2 hours. Your doctor may order a device called an Incentive Spirometer to help you take deep breaths.  When coughing or sneezing, hold a pillow firmly against your incision with both hands. This is called splinting. Doing this helps protect your incision. It also  decreases discomfort.  If you are being admitted to the hospital overnight, leave your suitcase in the car. After surgery it may be brought to your room.  In case of increased patient census, it may be necessary for you, the patient, to continue your postoperative care in the Same Day Surgery department.  Please call the Pre-admissions Testing Dept. at [phone] if you have any questions about these  instructions.  Surgery Visitation Policy:  Patients having surgery or a procedure may have two visitors.  Children under the age of 1 must have an adult with them who is not the patient.  Inpatient Visitation:    Visiting hours are 7 a.m. to 8 p.m. Up to four visitors are allowed at one time in a patient room. The visitors may rotate out with other people during the day.  One visitor age 54 or older may stay with the patient overnight and must be in the room by 8 p.m.   [employer]ch[employer], [employer] to address health-related social needs:  [email or web address]                                                                                                             Preparing for Surgery with CHLORHEXIDINE  GLUCONATE (CHG) Soap  Chlorhexidine  Gluconate (CHG) Soap  o An antiseptic cleaner that kills germs and bonds with the skin to continue killing germs even after washing  o Used for showering the night before surgery and morning of surgery  Before surgery, you can play an important role by reducing the number of germs on your skin.  CHG (Chlorhexidine  gluconate) soap is an antiseptic cleanser which kills germs and bonds with the skin to continue killing germs even after washing.  Please do not use if you have an allergy to CHG or antibacterial soaps. If your skin becomes reddened/irritated stop using the CHG.  1. Shower the NIGHT BEFORE SURGERY with CHG soap.  2. If you choose to wash your hair, wash your hair first as usual with your normal shampoo.  3. After shampooing, rinse your hair and body thoroughly to remove the shampoo.  4. Use CHG as you would any other liquid soap. You can apply CHG directly to the skin and wash gently with a clean washcloth.  5. Apply the CHG soap to your body only from the neck down. Do not use on open wounds or open sores. Avoid contact with your eyes, ears, mouth, and genitals (private parts). Wash face and genitals (private parts)  with your normal soap.  6. Wash thoroughly, paying special attention to the area where your surgery will be performed.  7. Thoroughly rinse your body with warm water .  8. Do not shower/wash with your normal soap after using and rinsing off the CHG soap.  9. Do not use lotions, oils, etc., after showering with CHG.  10. Pat yourself dry with a clean towel.  11. Wear clean pajamas to bed the night before surgery.  12. Place clean sheets on your bed the night of your shower and do  not sleep with pets.  13. Do not apply any deodorants/lotions/powders.  14. Please wear clean clothes to the hospital.  15. Remember to brush your teeth with your regular toothpaste.

## 2024-02-15 ENCOUNTER — Encounter
Admission: RE | Admit: 2024-02-15 | Discharge: 2024-02-15 | Disposition: A | Discharge status: Home / Self Care | Source: Ambulatory Visit | Attending: Urology | Admitting: Urology

## 2024-02-15 DIAGNOSIS — C61 Malignant neoplasm of prostate: Secondary | ICD-10-CM | POA: Diagnosis not present

## 2024-02-15 DIAGNOSIS — Z01812 Encounter for preprocedural laboratory examination: Secondary | ICD-10-CM | POA: Diagnosis present

## 2024-02-15 LAB — PROTIME-INR
INR: 0.9 (ref 0.8–1.2)
Prothrombin Time: 13.1 s (ref 11.4–15.2)

## 2024-02-15 LAB — URINALYSIS, COMPLETE (UACMP) WITH MICROSCOPIC
Bacteria, UA: NONE SEEN
Bilirubin Urine: NEGATIVE
Glucose, UA: NEGATIVE mg/dL
Hgb urine dipstick: NEGATIVE
Ketones, ur: NEGATIVE mg/dL
Leukocytes,Ua: NEGATIVE
Nitrite: NEGATIVE
Protein, ur: NEGATIVE mg/dL
Specific Gravity, Urine: 1.008 (ref 1.005–1.030)
pH: 7 (ref 5.0–8.0)

## 2024-02-15 LAB — CBC
HCT: 42.7 % (ref 39.0–52.0)
Hemoglobin: 14.5 g/dL (ref 13.0–17.0)
MCH: 30.2 pg (ref 26.0–34.0)
MCHC: 34 g/dL (ref 30.0–36.0)
MCV: 89 fL (ref 80.0–100.0)
Platelets: 235 K/uL (ref 150–400)
RBC: 4.8 MIL/uL (ref 4.22–5.81)
RDW: 13.2 % (ref 11.5–15.5)
WBC: 5.4 K/uL (ref 4.0–10.5)
nRBC: 0 % (ref 0.0–0.2)

## 2024-02-15 LAB — BASIC METABOLIC PANEL WITH GFR
Anion gap: 9 (ref 5–15)
BUN: 10 mg/dL (ref 8–23)
CO2: 26 mmol/L (ref 22–32)
Calcium: 9.7 mg/dL (ref 8.9–10.3)
Chloride: 97 mmol/L — ABNORMAL LOW (ref 98–111)
Creatinine, Ser: 0.94 mg/dL (ref 0.61–1.24)
GFR, Estimated: >60 mL/min
Glucose, Bld: 112 mg/dL — ABNORMAL HIGH (ref 70–99)
Potassium: 4 mmol/L (ref 3.5–5.1)
Sodium: 132 mmol/L — ABNORMAL LOW (ref 135–145)

## 2024-02-15 LAB — TYPE AND SCREEN
ABO/RH(D): A POS
Antibody Screen: NEGATIVE

## 2024-02-15 LAB — APTT: aPTT: 29 s (ref 24–36)

## 2024-02-16 ENCOUNTER — Encounter: Payer: Self-pay | Admitting: Urology

## 2024-02-16 LAB — URINE CULTURE: Culture: NO GROWTH

## 2024-02-16 NOTE — Progress Notes (Signed)
 " Randall Flores  Pre-Admission Testing Clinical Review / Pre-Operative Anesthesia Consult  Date: 02/16/24  PATIENT DEMOGRAPHICS: Name: Randall Flores. DOB: September 20, 1955 MRN:   [id number]  Note: Available PAT nursing documentation and vital signs have been reviewed. Clinical nursing staff has updated patient's PMH/PSHx, current medication list, and drug allergies/intolerances to ensure complete and comprehensive history available to assist care teams in MDM as it pertains to the aforementioned surgical procedure and anticipated anesthetic course. Extensive review of available clinical information personally performed. Nursing documentation reviewed. [place] PMH and PSHx updated with any diagnoses and/or procedures that I have knowledge of that may have been inadvertently omitted during his intake with the pre-admission testing department's nursing staff.  PLANNED SURGICAL PROCEDURE(S):   Case: [id number] Date/Time: 02/21/24 0715   Procedures:      PROSTATECTOMY, RADICAL, ROBOT-ASSISTED, LAPAROSCOPIC     LYMPHADENECTOMY, PELVIS, ROBOT-ASSISTED (Bilateral)   Anesthesia type: General   Diagnosis: Prostate cancer (HCC) [C61]   Pre-op diagnosis: Prostate Cancer   Location: [facility] OR ROOM 07 / [facility]S FOR AN[facility]   Surgeons: Georganne Penne SAUNDERS, MD        CLINICAL DISCUSSION: Randall Flores. is a 69 y.o. male who is submitted for pre-surgical anesthesia review and clearance prior to him undergoing the above procedure. Patient has never been a smoker in the past. Pertinent PMH includes: severe aortic stenosis (s/p AVR), postoperative atrial fibrillation, PVCs, TIA, aortic atherosclerosis, HTN, HLD, hypothyroidism, prediabetes, OSAH (no nocturnal PAP therapy), prostate cancer, anxiety, depression, insomnia.  Patient is followed by cardiology Thera, MD). He was last seen in the cardiology clinic on 01/06/2024; notes reviewed. At the time of his clinic  visit, patient doing well overall from a cardiovascular perspective. Patient denied any chest pain, shortness of breath, PND, orthopnea, palpitations, significant peripheral edema, weakness, fatigue, vertiginous symptoms, or presyncope/syncope. Patient with a past medical history significant for cardiovascular diagnoses. Documented physical exam was grossly benign, providing no evidence of acute exacerbation and/or decompensation of the patient's known cardiovascular conditions.  Most recent myocardial perfusion imaging study was performed on 02/10/2021 revealing a  normal left ventricular systolic function with an EF of 55-65%.  There were no regional wall motion abnormalities.  No artifact or left ventricular cavity size enlargement appreciated on review of imaging. SPECT images demonstrated no evidence of stress-induced myocardial ischemia or arrhythmia; no scintigraphic evidence of scar.  TID ratio = 1.13 (normal range </= 1.2). Study determined to be normal and low risk.  Patient underwent diagnostic RIGHT heart catheterization on 03/10/2021.  Study revealed minimal coronary plaquing in the LAD.  There was no angiographically significant coronary artery disease noted.  Hemodynamics: me RA = 12 mmHg, PA = 35/22 mmHg, mean PCWP = 23 mmHg, AO saturation = 99%, PA saturation = 80%, CO = 7.9 L/min, and CI = 3.6 L/min/m.  Aortic valve calcified and found to be severely stenotic.  Patient was referred to  CVTS for further evaluation regarding valve replacement.  Patient underwent AVR on 04/16/2021.  25 mm Edwards Resilia Inspiris bioprosthetic valve was placed.  Procedure was complicated by development of postoperative atrial fibrillation.  Patient stabilized inpatient and discharged home in stable condition.  Most recent long-term cardiac event monitor study was performed on 07/06/2021 revealing a predominant underlying sinus rhythm with first-degree AV block.  Average heart rate was 69 bpm; range 50-103  bpm.  There was rare atrial and ventricular ectopy.  No evidence for recurrent atrial fibrillation/flutter.  There  were no sustained arrhythmias or prolonged pauses.  No patient triggered events.  Most recent TTE performed on 05/13/2022 revealed a normal left ventricular systolic function with an EF of 60-65%. There were no regional wall motion abnormalities. Left ventricular diastolic Doppler parameters were normal.  Left atrium mildly dilated.  Right ventricular size and function normal with a TAPSE measuring 2.5 cm  (normal range >/= 1.6 cm).  RVSP = 36.8 mmHg. bioprosthetic aortic valve well-seated and functioning properly.  There was mild mitral valve regurgitation.  All transvalvular gradients were noted to be normal providing no evidence of hemodynamically significant valvular stenosis.  There was borderline dilatation of the ascending aorta measuring up to 36 mm.  Blood pressure mildly elevated at 130/90 mmHg on currently prescribed ARB (losartan ) monotherapy.  Patient is on simvastatin for his HLD diagnosis and ASCVD prevention. Patient has a prediabetes diagnosis that he is managing with diet lifestyle modifications alone.  Most recent hemoglobin A1c was 6.0% when checked on 03/07/2023. Patient does have an OSAH diagnosis, however he does not require the use of nocturnal PAP therapy. Patient is able to complete all of his  ADL/IADLs without cardiovascular limitation.  Per the DASI, patient is able to achieve at least 4 METS of physical activity without experiencing any significant degree of angina/anginal equivalent symptoms.  Given his elevated blood pressure, losartan  dose was increased.  No other changes were made to his medication regimen during his visit with cardiology.  Patient scheduled to follow-up with outpatient cardiology in 12 months or sooner if needed.  Randall Flores. is scheduled for an elective PROSTATECTOMY, RADICAL, ROBOT-ASSISTED, LAPAROSCOPIC; LYMPHADENECTOMY, PELVIS,  ROBOT-ASSISTED (Bilateral) on 02/21/2024 with Dr. Penne [name], MD. Given patient's past medical history significant for cardiovascular diagnoses, presurgical cardiac clearance was sought by the PAT team. Per cardiology, based ACC/AHA guidelines, the patient's past medical history, and the amount of time since his last clinic visit, this patient would be at an overall ACCEPTABLE risk for the planned procedure without further cardiovascular testing or intervention at this time.   In review of his medication reconciliation, the patient is not noted to be taking any type of anticoagulation or antiplatelet therapies that would need to be held during his Randall course.  Patient reports denies previous Randall complications with anesthesia in the past.  Patient has experienced (+) early emergence from anesthesia in the past.  In review his EMR, it is noted that patient underwent a general anesthetic course here at [facility] ([facility]) in 06/2021 without documented complications.   MOST RECENT VITAL SIGNS:    02/14/2024    9:33 AM 02/02/2024    1:30 PM 01/06/2024   10:58 AM  Vitals with BMI  Height 5' 10.5 5' 10.5 5' 10.5  Weight 200 lbs 207 lbs 207 lbs  BMI 28.28 29.27 29.27  Systolic  182 130  Diastolic  99 90  Pulse  77 73   PROVIDERS/SPECIALISTS: NOTE: Primary physician provider listed below. Patient may have been seen by APP or partner within same practice.   PROVIDER ROLE / SPECIALTY LAST OV  Skye Penne JONELLE, MD Urology (Surgeon) [phone], Buel HERO, MD Primary Care Provider ???  Darliss Rogue, MD Cardiology 01/06/2024   ALLERGIES: Codeine, Pravastatin, and Shellfish allergy  CURRENT HOME MEDICATIONS:  docusate sodium  (COLACE) 100 MG capsule   DULoxetine  HCl 40 MG CPEP   levothyroxine  (SYNTHROID ) 112 MCG tablet   losartan  (COZAAR ) 50 MG tablet  rosuvastatin  (CRESTOR ) 20 MG tablet   tamsulosin  (FLOMAX ) 0.4 MG CAPS  capsule   traZODone  (DESYREL ) 50 MG tablet   oxybutynin  (DITROPAN -XL) 10 MG 24 hr tablet   HISTORY: Past Medical History:  Diagnosis Date   Anxiety    Aortic atherosclerosis    BPH with obstruction/lower urinary tract symptoms    Colon polyp    Complication of anesthesia    a.) early emergence   DDD (degenerative disc disease), lumbar    Diverticulitis    Diverticulosis    Elevated PSA    Fatty liver 01/22/2015   Hepatic cyst 06/08/2016   Hepatic hemangioma 02/03/2015   History of colitis    History of kidney stones    History of marijuana use    Hypercholesteremia    Hypertension    Hypothyroidism    Insomnia    a.) uses trazodone  PRN   Major depression    Near syncope 04/25/2017   due to stress   OA (osteoarthritis)    Postoperative atrial fibrillation (HCC) 04/16/2021   a.) s/p AVR   Pre-diabetes    Prostate cancer (HCC)    PVC (premature ventricular contraction)    Severe aortic stenosis 05/24/2017   a.) s/p AVR (25mm Edwards Resilia Inspiris)   Sleep apnea    a.) no nocturnal PAP therapy   TIA (transient ischemic attack) 2015   a.) right side affected, but improved   Wears dentures    partial upper, front   Past Surgical History:  Procedure Laterality Date   AORTIC VALVE REPLACEMENT N/A 04/16/2021   Procedure: AORTIC VALVE REPLACEMENT (AVR) USING EDWARDS INSPIRIS AORTIC VALVE ;  Surgeon: Lucas Dorise POUR, MD;  Location: [facility] OR;  Service: Open Heart Surgery;  Laterality: N/A;   BACK SURGERY  1998, 2001    L5-S1   COLONOSCOPY     [place]    COLONOSCOPY WITH PROPOFOL  N/A 01/21/2017   Procedure: COLONOSCOPY WITH PROPOFOL ;  Surgeon: Jinny Carmine, MD;  Location: [facility];  Service: Endoscopy;  Laterality: N/A;   FLEXIBLE SIGMOIDOSCOPY N/A 07/20/2021   Procedure: FLEXIBLE SIGMOIDOSCOPY;  Surgeon: Jinny Carmine, MD;  Location: [facility] ENDOSCOPY;  Service: Endoscopy;  Laterality: N/A;   POLYPECTOMY  01/21/2017   Procedure: POLYPECTOMY;  Surgeon:  Jinny Carmine, MD;  Location: [facility];  Service: Endoscopy;;   RIGHT HEART CATH AND CORONARY ANGIOGRAPHY N/A 03/10/2021   Procedure: RIGHT HEART CATH AND CORONARY ANGIOGRAPHY;  Surgeon: Mady Bruckner, MD;  Location: [facility] INVASIVE CV LAB;  Service: Cardiovascular;  Laterality: N/A;   TEE WITHOUT CARDIOVERSION N/A 04/16/2021   Procedure: TRANSESOPHAGEAL ECHOCARDIOGRAM (TEE);  Surgeon: Lucas Dorise POUR, MD;  Location: [facility] OR;  Service: Open Heart Surgery;  Laterality: N/A;   TOTAL KNEE ARTHROPLASTY Right 04/07/2018   Procedure: TOTAL KNEE ARTHROPLASTY;  Surgeon: Shari Sieving, MD;  Location: [facility] ORS;  Service: Orthopedics;  Laterality: Right;   Family History  Problem Relation Age of Onset   Alzheimer's disease Mother    Social History   Tobacco Use   Smoking status: Never   Smokeless tobacco: Never  Substance Use Topics   Alcohol use: Not Currently    Alcohol/week: 28.0 - 42.0 standard drinks of alcohol    Types: 28 - 42 Cans of beer per week    Comment: 4-6 beers a day   LABS:  Hospital Outpatient Visit on 02/15/2024  Component Date Value Ref Range Status   ABO/RH(D) 02/15/2024 A POS   Final   Antibody Screen 02/15/2024 NEG   Final  Sample Expiration 02/15/2024 02/29/2024,2359   Final   Extend sample reason 02/15/2024    Final                   Value:NO TRANSFUSIONS OR PREGNANCY IN THE PAST 3 MONTHS Performed at [facility], [address] [place], [place] [address]    WBC 02/15/2024 5.4  4.0 - 10.5 K/uL Final   RBC 02/15/2024 4.80  4.22 - 5.81 MIL/uL Final   Hemoglobin 02/15/2024 14.5  13.0 - 17.0 g/dL Final   HCT 98/78/7973 42.7  39.0 - 52.0 % Final   MCV 02/15/2024 89.0  80.0 - 100.0 fL Final   MCH 02/15/2024 30.2  26.0 - 34.0 pg Final   MCHC 02/15/2024 34.0  30.0 - 36.0 g/dL Final   RDW 98/78/7973 13.2  11.5 - 15.5 % Final   Platelets 02/15/2024 235  150 - 400 K/uL Final   nRBC 02/15/2024 0.0  0.0 - 0.2 % Final   Performed at [facility],  [address] [place], [place] [address]   Sodium 02/15/2024 132 (L)  135 - 145 mmol/L Final   Potassium 02/15/2024 4.0  3.5 - 5.1 mmol/L Final   Chloride 02/15/2024 97 (L)  98 - 111 mmol/L Final   CO2 02/15/2024 26  22 - 32 mmol/L Final   Glucose, Bld 02/15/2024 112 (H)  70 - 99 mg/dL Final   Glucose reference range applies only to samples taken after fasting for at least 8 hours.   BUN 02/15/2024 10  8 - 23 mg/dL Final   Creatinine, Ser 02/15/2024 0.94  0.61 - 1.24 mg/dL Final   Calcium  02/15/2024 9.7  8.9 - 10.3 mg/dL Final   GFR, Estimated 02/15/2024 >60  >60 mL/min Final   Comment: (NOTE) Calculated using the CKD-EPI Creatinine Equation (2021)    Anion gap 02/15/2024 9  5 - 15 Final   Performed at [facility], [address] [place], [place] [address]   Prothrombin Time 02/15/2024 13.1  11.4 - 15.2 seconds Final   INR 02/15/2024 0.9  0.8 - 1.2 Final   Comment: (NOTE) INR goal varies based on device and disease states. Performed at [facility], [address] [place], [place] [address]    aPTT 02/15/2024 29  24 - 36 seconds Final   Performed at [facility], [address] [place], [place] [address]   Color, Urine 02/15/2024 YELLOW (A)  YELLOW Final   APPearance 02/15/2024 CLEAR (A)  CLEAR Final   Specific Gravity, Urine 02/15/2024 1.008  1.005 - 1.030 Final   pH 02/15/2024 7.0  5.0 - 8.0 Final   Glucose, UA 02/15/2024 NEGATIVE  NEGATIVE mg/dL Final   Hgb urine dipstick 02/15/2024 NEGATIVE  NEGATIVE Final   Bilirubin Urine 02/15/2024 NEGATIVE  NEGATIVE Final   Ketones, ur 02/15/2024 NEGATIVE  NEGATIVE mg/dL Final   Protein, ur 98/78/7973 NEGATIVE  NEGATIVE mg/dL Final   Nitrite 98/78/7973 NEGATIVE  NEGATIVE Final   Leukocytes,Ua 02/15/2024 NEGATIVE  NEGATIVE Final   RBC / HPF 02/15/2024 0-5  0 - 5 RBC/hpf Final   WBC, UA 02/15/2024 0-5  0 - 5 WBC/hpf Final   Bacteria, UA 02/15/2024 NONE SEEN  NONE SEEN Final   Squamous Epithelial / HPF  02/15/2024 0-5  0 - 5 /HPF Final   Performed at [facility], [address] [place], [place] [address]   Specimen Description 02/15/2024    Final  Value:URINE, CLEAN CATCH Performed at [facility], [address], [place], [place] [address]    Special Requests 02/15/2024    Final                   Value:NONE Performed at [facility], [address] [place], [place] [address]    Culture 02/15/2024    Final                   Value:NO GROWTH Performed at [facility], [address] [place], [place] [address]    Report Status 02/15/2024 02/16/2024 FINAL   Final    ECG: Date: 01/06/2024  Time ECG obtained: 1111 AM Rate: 73 bpm Rhythm: normal sinus Axis (leads I and aVF): normal Intervals: PR 208 ms. QRS 76 ms. QTc 438 ms. ST segment and T wave changes: No evidence of acute T wave abnormalities or significant ST segment elevation or depression.  Evidence of a possible, age undetermined, prior infarct:  No Comparison: Similar to previous tracing obtained on 06/27/2023   IMAGING / PROCEDURES: TRANSTHORACIC ECHOCARDIOGRAM performed on 05/13/2022 Left ventricular ejection fraction, by estimation, is 60 to 65%. The left ventricle has normal function. The left ventricle has no regional wall motion abnormalities. Left ventricular diastolic parameters were  normal.  Right ventricular systolic function is normal. The right ventricular size is normal. There is mildly elevated pulmonary artery systolic pressure. The estimated right ventricular systolic pressure is 36.8 mmHg.  Left atrial size was moderately dilated.  The mitral valve is normal in structure. Mild mitral valve regurgitation. No evidence of mitral stenosis.  The aortic valve is normal in structure. Aortic valve regurgitation is not visualized. No aortic stenosis is present. There is a 25 mm Edwards valve present in the aortic position. Procedure Date: 04/16/21.  There is  borderline dilatation of the ascending aorta, measuring 36 mm.  The inferior vena cava is normal in size with greater than 50% respiratory variability, suggesting right atrial pressure of 3 mmHg.   LONG TERM CARDIAC EVENT MONITOR STUDY performed on 07/06/2021 Patch Wear Time:  11 days and 21 hours (2023-05-24T10:49:06-398 to 2023-06-05T08:19:39-0400) Patient had a min HR of 50 bpm, max HR of 103 bpm, and avg HR of 69 bpm.  Predominant underlying rhythm was Sinus Rhythm.  First Degree AV Block was present. Isolated SVEs were rare (<1.0%), and no SVE Couplets or SVE Triplets were present.  Isolated VEs were rare (<1.0%), and no VE Couplets or VE Triplets were present. Normal cardiac monitor no evidence for atrial fibrillation or atrial flutter.  RIGHT HEART CATHETERIZATION AND CORONARY ANGIOGRAPHY performed on 03/10/2021 Minimal coronary plaquing in the LAD.  No angiographically significant coronary artery disease identified. Mildly elevated left heart filling pressures. Moderately elevated right heart filling pressure with mild RVOT gradient. RA (mean): 12 mmHg RV (S/EDP): 44/13 mmHg PA (S/D, mean): 35/22 mmHg PCWP (mean): 23 mmHg Ao sat: 99% PA sat: 80% Normal Fick cardiac output/index. Fick CO: 7.9 L/min Fick CI: 3.6 L/min/m^2  Recommendations: Initiate gentle diuresis for element of HFpEF.  Volume status will need to be watched closely in the setting of severe aortic stenosis. Primary prevention of coronary artery disease. Proceed with referral to cardiac surgery/valve team for further workup/management of severe aortic stenosis by echo.   IMPRESSION AND PLAN: Shi Blankenship. has been referred for pre-anesthesia review and clearance prior to him undergoing the planned anesthetic and procedural courses. Available labs, pertinent testing, and imaging results were  personally reviewed by me in preparation for upcoming operative/procedural course. [facility] medical record has been  updated following extensive record review and patient interview with PAT staff.   This patient has been appropriately cleared by cardiology with an overall ACCEPTABLE risk of patient experiencing significant Randall cardiovascular complications. here at [facility]. Based on clinical review performed today (02/16/24), barring any significant acute changes in the patient's overall condition, it is anticipated that he will be able to proceed with the planned surgical intervention. Any acute changes in clinical condition may necessitate his procedure being postponed and/or cancelled. Patient will meet with anesthesia team (MD and/or CRNA) on the day of his procedure for preoperative evaluation/assessment. Questions regarding anesthetic course will be fielded at that time.   Pre-surgical instructions were reviewed with the patient during his PAT appointment, and questions were fielded to satisfaction by PAT clinical staff. He has been instructed on which medications that he will need to hold prior to surgery, as well as the ones that have been deemed safe/appropriate to take on the day of his procedure. As part of the general education provided by PAT, patient made aware both verbally and in writing, that he would need to abstain from the use of any illegal substances during his Randall course. He was advised that failure to follow the provided instructions could necessitate case cancellation or result in serious Randall complications up to and including death. Patient encouraged to contact PAT and/or his surgeon's office to discuss any questions or concerns that may arise prior to surgery; verbalized understanding.   Dorise Pereyra, MSN, APRN, FNP-C, CEN [facility]  Randall Flores Nurse Practitioner Phone: [phone] Fax: [phone] 02/16/24 1:15 PM  NOTE: This note has been prepared using Dragon dictation software. Despite  my best ability to proofread, there is always the potential that unintentional transcriptional errors may still occur from this process. "

## 2024-02-17 ENCOUNTER — Encounter: Payer: Self-pay | Admitting: Urology

## 2024-02-20 MED ORDER — LACTATED RINGERS IV SOLN: INTRAVENOUS | Status: DC

## 2024-02-20 MED ORDER — CEFAZOLIN SODIUM-DEXTROSE 2-4 GM/100ML-% IV SOLN
2.0000 g | INTRAVENOUS | Status: AC
  Administered 2024-02-21: 2 g via INTRAVENOUS

## 2024-02-20 MED ORDER — CHLORHEXIDINE GLUCONATE 4 % EX SOLN
60.0000 mL | Freq: Once | CUTANEOUS | Status: AC
  Administered 2024-02-21: 4 via TOPICAL

## 2024-02-20 MED ORDER — HEPARIN SODIUM (PORCINE) 5000 UNIT/ML IJ SOLN
5000.0000 [IU] | INTRAMUSCULAR | Status: AC
  Administered 2024-02-21: 5000 [IU] via SUBCUTANEOUS

## 2024-02-20 MED ORDER — ACETAMINOPHEN 500 MG PO TABS
1000.0000 mg | ORAL_TABLET | Freq: Once | ORAL | Status: AC
  Administered 2024-02-21: 1000 mg via ORAL

## 2024-02-20 MED ORDER — ORAL CARE MOUTH RINSE: 15.0000 mL | Freq: Once | OROMUCOSAL | Status: AC

## 2024-02-20 MED ORDER — CHLORHEXIDINE GLUCONATE 4 % EX SOLN: 60.0000 mL | Freq: Once | CUTANEOUS | Status: DC

## 2024-02-20 MED ORDER — CHLORHEXIDINE GLUCONATE 0.12 % MT SOLN
15.0000 mL | Freq: Once | OROMUCOSAL | Status: AC
  Administered 2024-02-21: 15 mL via OROMUCOSAL

## 2024-02-20 NOTE — Anesthesia Preprocedure Evaluation (Signed)
 "                                  Anesthesia Evaluation  Patient identified by MRN, date of birth, ID band Patient awake    Reviewed: Allergy & Precautions, H&P , NPO status , Patient's Chart, lab work & pertinent test results  History of Anesthesia Complications (+) history of anesthetic complications (early emergence)  Airway Mallampati: III  TM Distance: >3 FB Neck ROM: full    Dental  (+) Missing, Partial Upper, Partial Lower   Pulmonary sleep apnea    Pulmonary exam normal        Cardiovascular hypertension, Normal cardiovascular exam+ dysrhythmias (postoperative atrial fibrillation, PVCs,) + Valvular Problems/Murmurs (s/p AVR 2023)   Most recent TTE performed on 05/13/2022 revealed a normal left ventricular systolic function with an EF of 60-65%. There were no regional wall motion abnormalities. Left ventricular diastolic Doppler parameters were normal.  Left atrium mildly dilated.  Right ventricular size and function normal with a TAPSE measuring 2.5 cm  (normal range >/= 1.6 cm).  RVSP = 36.8 mmHg. bioprosthetic aortic valve well-seated and functioning properly.  There was mild mitral valve regurgitation.  All transvalvular gradients were noted to be normal providing no evidence of hemodynamically significant valvular stenosis.  There was borderline dilatation of the ascending aorta measuring up to 36 mm.   Neuro/Psych  PSYCHIATRIC DISORDERS Anxiety Depression    TIA (2015)   GI/Hepatic negative GI ROS, Neg liver ROS,,,  Endo/Other  Hypothyroidism    Renal/GU      Musculoskeletal   Abdominal Normal abdominal exam  (+)   Peds  Hematology negative hematology ROS (+)   Anesthesia Other Findings Past Medical History: No date: Anxiety No date: Aortic atherosclerosis No date: BPH with obstruction/lower urinary tract symptoms No date: Colon polyp No date: Complication of anesthesia     Comment:  a.) early emergence No date: DDD (degenerative disc  disease), lumbar No date: Diverticulitis No date: Diverticulosis No date: Elevated PSA 01/22/2015: Fatty liver 06/08/2016: Hepatic cyst 02/03/2015: Hepatic hemangioma No date: History of colitis No date: History of kidney stones No date: History of marijuana use No date: Hypercholesteremia No date: Hypertension No date: Hypothyroidism No date: Insomnia     Comment:  a.) uses trazodone  PRN No date: Major depression 04/25/2017: Near syncope     Comment:  due to stress No date: OA (osteoarthritis) 04/16/2021: Postoperative atrial fibrillation (HCC)     Comment:  a.) s/p AVR No date: Pre-diabetes No date: Prostate cancer (HCC) No date: PVC (premature ventricular contraction) 05/24/2017: Severe aortic stenosis     Comment:  a.) s/p AVR (25mm Edwards Resilia Inspiris) 04/16/2021 No date: Sleep apnea     Comment:  a.) no nocturnal PAP therapy 2015: TIA (transient ischemic attack)     Comment:  a.) right side affected, but improved No date: Wears dentures     Comment:  partial upper, front  Past Surgical History: 04/16/2021: AORTIC VALVE REPLACEMENT; N/A     Comment:  Procedure: AORTIC VALVE REPLACEMENT (AVR) USING EDWARDS               INSPIRIS AORTIC VALVE ;  Surgeon: Lucas Dorise POUR,               MD;  Location: [facility] OR;  Service: Open Heart Surgery;  Laterality: N/A; 1998, 2001: BACK SURGERY     Comment:  [place] L5-S1 No date: COLONOSCOPY     Comment:  [place]  01/21/2017: COLONOSCOPY WITH PROPOFOL ; N/A     Comment:  Procedure: COLONOSCOPY WITH PROPOFOL ;  Surgeon: Jinny Carmine, MD;  Location: [facility];  Service:               Endoscopy;  Laterality: N/A; 07/20/2021: FLEXIBLE SIGMOIDOSCOPY; N/A     Comment:  Procedure: FLEXIBLE SIGMOIDOSCOPY;  Surgeon: Jinny Carmine, MD;  Location: [facility] ENDOSCOPY;  Service:               Endoscopy;  Laterality: N/A; 01/21/2017: POLYPECTOMY     Comment:  Procedure: POLYPECTOMY;   Surgeon: Jinny Carmine, MD;                Location: [facility];  Service: Endoscopy;; 03/10/2021: RIGHT HEART CATH AND CORONARY ANGIOGRAPHY; N/A     Comment:  Procedure: RIGHT HEART CATH AND CORONARY ANGIOGRAPHY;                Surgeon: Mady Bruckner, MD;  Location: [facility] INVASIVE               CV LAB;  Service: Cardiovascular;  Laterality: N/A; 04/16/2021: TEE WITHOUT CARDIOVERSION; N/A     Comment:  Procedure: TRANSESOPHAGEAL ECHOCARDIOGRAM (TEE);                Surgeon: Lucas Dorise POUR, MD;  Location: [facility] OR;  Service:              Open Heart Surgery;  Laterality: N/A; 04/07/2018: TOTAL KNEE ARTHROPLASTY; Right     Comment:  Procedure: TOTAL KNEE ARTHROPLASTY;  Surgeon: Shari Sieving, MD;  Location: [facility] ORS;  Service: Orthopedics;                Laterality: Right;     Reproductive/Obstetrics negative OB ROS                              Anesthesia Physical Anesthesia Plan  ASA: 3  Anesthesia Plan: General ETT   Post-op Pain Management: Tylenol  PO (pre-op)* and Toradol  IV (intra-op)*   Induction: Intravenous  PONV Risk Score and Plan: 2 and Ondansetron , Dexamethasone  and Midazolam   Airway Management Planned: Oral ETT  Additional Equipment:   Intra-op Plan:   Post-operative Plan: Extubation in OR  Informed Consent: I have reviewed the patients History and Physical, chart, labs and discussed the procedure including the risks, benefits and alternatives for the proposed anesthesia with the patient or authorized representative who has indicated his/her understanding and acceptance.     Dental Advisory Given  Plan Discussed with: CRNA and Surgeon  Anesthesia Plan Comments:          Anesthesia Quick Evaluation  "

## 2024-02-21 ENCOUNTER — Observation Stay
Admission: RE | Admit: 2024-02-21 | Discharge: 2024-02-22 | Disposition: A | Discharge status: Home / Self Care | Source: Ambulatory Visit | Attending: Urology | Admitting: Urology

## 2024-02-21 ENCOUNTER — Other Ambulatory Visit: Payer: Self-pay

## 2024-02-21 ENCOUNTER — Ambulatory Visit: Payer: Self-pay | Admitting: Urgent Care

## 2024-02-21 ENCOUNTER — Encounter: Payer: Self-pay | Admitting: Urology

## 2024-02-21 ENCOUNTER — Encounter: Admission: RE | Disposition: A | Payer: Self-pay | Source: Ambulatory Visit | Attending: Urology

## 2024-02-21 DIAGNOSIS — I10 Essential (primary) hypertension: Secondary | ICD-10-CM | POA: Insufficient documentation

## 2024-02-21 DIAGNOSIS — E039 Hypothyroidism, unspecified: Secondary | ICD-10-CM | POA: Insufficient documentation

## 2024-02-21 DIAGNOSIS — Z79899 Other long term (current) drug therapy: Secondary | ICD-10-CM | POA: Diagnosis not present

## 2024-02-21 DIAGNOSIS — C61 Malignant neoplasm of prostate: Principal | ICD-10-CM | POA: Insufficient documentation

## 2024-02-21 DIAGNOSIS — R350 Frequency of micturition: Secondary | ICD-10-CM

## 2024-02-21 HISTORY — DX: Malignant neoplasm of prostate: C61

## 2024-02-21 HISTORY — DX: Atherosclerosis of aorta: I70.0

## 2024-02-21 HISTORY — DX: Insomnia, unspecified: G47.00

## 2024-02-21 HISTORY — DX: Cannabis use, unspecified, in remission: F12.91

## 2024-02-21 MED ORDER — BUPIVACAINE LIPOSOME 1.3 % IJ SUSP
INTRAMUSCULAR | Status: AC
  Filled 2024-02-21: qty 20

## 2024-02-21 MED ORDER — FENTANYL CITRATE (PF) 100 MCG/2ML IJ SOLN
INTRAMUSCULAR | Status: AC
  Filled 2024-02-21: qty 2

## 2024-02-21 MED ORDER — ONDANSETRON HCL 4 MG/2ML IJ SOLN
INTRAMUSCULAR | Status: DC | PRN
  Administered 2024-02-21: 4 mg via INTRAVENOUS

## 2024-02-21 MED ORDER — HEPARIN SODIUM (PORCINE) 5000 UNIT/ML IJ SOLN
INTRAMUSCULAR | Status: AC
  Filled 2024-02-21: qty 1

## 2024-02-21 MED ORDER — PROPOFOL 10 MG/ML IV BOLUS
INTRAVENOUS | Status: AC
  Filled 2024-02-21: qty 20

## 2024-02-21 MED ORDER — ACETAMINOPHEN 10 MG/ML IV SOLN: 1000.0000 mg | Freq: Once | INTRAVENOUS | Status: DC | PRN

## 2024-02-21 MED ORDER — DEXTROSE-SODIUM CHLORIDE 5-0.45 % IV SOLN: INTRAVENOUS | Status: AC

## 2024-02-21 MED ORDER — ACETAMINOPHEN 500 MG PO TABS
1000.0000 mg | ORAL_TABLET | Freq: Four times a day (QID) | ORAL | Status: AC
  Administered 2024-02-21 – 2024-02-22 (×3): 1000 mg via ORAL
  Filled 2024-02-21 (×3): qty 2

## 2024-02-21 MED ORDER — OXYCODONE HCL 5 MG/5ML PO SOLN: 5.0000 mg | Freq: Once | ORAL | Status: AC | PRN

## 2024-02-21 MED ORDER — ONDANSETRON HCL 4 MG/2ML IJ SOLN
INTRAMUSCULAR | Status: AC
  Filled 2024-02-21: qty 2

## 2024-02-21 MED ORDER — DEXAMETHASONE SOD PHOSPHATE PF 10 MG/ML IJ SOLN
INTRAMUSCULAR | Status: DC | PRN
  Administered 2024-02-21: 8 mg via INTRAVENOUS

## 2024-02-21 MED ORDER — LIDOCAINE HCL (PF) 2 % IJ SOLN
INTRAMUSCULAR | Status: AC
  Filled 2024-02-21: qty 5

## 2024-02-21 MED ORDER — EPHEDRINE SULFATE-NACL 50-0.9 MG/10ML-% IV SOSY
PREFILLED_SYRINGE | INTRAVENOUS | Status: DC | PRN
  Administered 2024-02-21: 10 mg via INTRAVENOUS

## 2024-02-21 MED ORDER — ONDANSETRON HCL 4 MG/2ML IJ SOLN
4.0000 mg | INTRAMUSCULAR | Status: DC | PRN
  Administered 2024-02-21 – 2024-02-22 (×2): 4 mg via INTRAVENOUS
  Filled 2024-02-21 (×2): qty 2

## 2024-02-21 MED ORDER — ROCURONIUM BROMIDE 10 MG/ML (PF) SYRINGE
PREFILLED_SYRINGE | INTRAVENOUS | Status: AC
  Filled 2024-02-21: qty 10

## 2024-02-21 MED ORDER — HYDROMORPHONE HCL 1 MG/ML IJ SOLN
INTRAMUSCULAR | Status: DC | PRN
  Administered 2024-02-21 (×2): .5 mg via INTRAVENOUS

## 2024-02-21 MED ORDER — OXYCODONE HCL 5 MG PO TABS
5.0000 mg | ORAL_TABLET | ORAL | Status: DC | PRN
  Administered 2024-02-21: 5 mg via ORAL
  Filled 2024-02-21: qty 1
  Filled 2024-02-21: qty 2

## 2024-02-21 MED ORDER — CHLORHEXIDINE GLUCONATE 0.12 % MT SOLN
OROMUCOSAL | Status: AC
  Filled 2024-02-21: qty 15

## 2024-02-21 MED ORDER — HEMOSTATIC AGENTS (NO CHARGE) OPTIME
TOPICAL | Status: DC | PRN
  Administered 2024-02-21 (×3): 1 via TOPICAL

## 2024-02-21 MED ORDER — FENTANYL CITRATE (PF) 100 MCG/2ML IJ SOLN
INTRAMUSCULAR | Status: DC | PRN
  Administered 2024-02-21 (×6): 50 ug via INTRAVENOUS

## 2024-02-21 MED ORDER — BUPIVACAINE LIPOSOME 1.3 % IJ SUSP
INTRAMUSCULAR | Status: DC | PRN
  Administered 2024-02-21: 50 mL via INTRAMUSCULAR

## 2024-02-21 MED ORDER — CEFAZOLIN SODIUM-DEXTROSE 2-4 GM/100ML-% IV SOLN
INTRAVENOUS | Status: AC
  Filled 2024-02-21: qty 100

## 2024-02-21 MED ORDER — MIDAZOLAM HCL 2 MG/2ML IJ SOLN
INTRAMUSCULAR | Status: AC
  Filled 2024-02-21: qty 2

## 2024-02-21 MED ORDER — PHENYLEPHRINE 80 MCG/ML (10ML) SYRINGE FOR IV PUSH (FOR BLOOD PRESSURE SUPPORT)
PREFILLED_SYRINGE | INTRAVENOUS | Status: DC | PRN
  Administered 2024-02-21: 80 ug via INTRAVENOUS

## 2024-02-21 MED ORDER — SENNOSIDES-DOCUSATE SODIUM 8.6-50 MG PO TABS
2.0000 | ORAL_TABLET | Freq: Every day | ORAL | Status: DC
  Administered 2024-02-21: 2 via ORAL
  Filled 2024-02-21: qty 2

## 2024-02-21 MED ORDER — DEXAMETHASONE SOD PHOSPHATE PF 10 MG/ML IJ SOLN
INTRAMUSCULAR | Status: AC
  Filled 2024-02-21: qty 1

## 2024-02-21 MED ORDER — OXYCODONE HCL 5 MG PO TABS
5.0000 mg | ORAL_TABLET | Freq: Once | ORAL | Status: AC | PRN
  Administered 2024-02-21: 5 mg via ORAL

## 2024-02-21 MED ORDER — LIDOCAINE HCL (CARDIAC) PF 100 MG/5ML IV SOSY
PREFILLED_SYRINGE | INTRAVENOUS | Status: DC | PRN
  Administered 2024-02-21: 60 mg via INTRAVENOUS

## 2024-02-21 MED ORDER — SODIUM CHLORIDE 0.9 % IV BOLUS
1000.0000 mL | Freq: Once | INTRAVENOUS | Status: AC
  Administered 2024-02-21: 1000 mL via INTRAVENOUS

## 2024-02-21 MED ORDER — PROPOFOL 10 MG/ML IV BOLUS
INTRAVENOUS | Status: DC | PRN
  Administered 2024-02-21: 150 mg via INTRAVENOUS

## 2024-02-21 MED ORDER — DROPERIDOL 2.5 MG/ML IJ SOLN: 0.6250 mg | Freq: Once | INTRAMUSCULAR | Status: DC | PRN

## 2024-02-21 MED ORDER — "VISTASEAL 4 ML SINGLE DOSE KIT "
PACK | CUTANEOUS | Status: AC
  Filled 2024-02-21: qty 4

## 2024-02-21 MED ORDER — FENTANYL CITRATE (PF) 100 MCG/2ML IJ SOLN
25.0000 ug | INTRAMUSCULAR | Status: DC | PRN
  Administered 2024-02-21: 25 ug via INTRAVENOUS
  Administered 2024-02-21: 50 ug via INTRAVENOUS
  Administered 2024-02-21 (×3): 25 ug via INTRAVENOUS

## 2024-02-21 MED ORDER — ACETAMINOPHEN 500 MG PO TABS
ORAL_TABLET | ORAL | Status: AC
  Filled 2024-02-21: qty 2

## 2024-02-21 MED ORDER — SUGAMMADEX SODIUM 200 MG/2ML IV SOLN
INTRAVENOUS | Status: DC | PRN
  Administered 2024-02-21: 200 mg via INTRAVENOUS

## 2024-02-21 MED ORDER — OXYCODONE HCL 5 MG PO TABS
ORAL_TABLET | ORAL | Status: AC
  Filled 2024-02-21: qty 1

## 2024-02-21 MED ORDER — HYDROMORPHONE HCL 1 MG/ML IJ SOLN
INTRAMUSCULAR | Status: AC
  Filled 2024-02-21: qty 1

## 2024-02-21 MED ORDER — ROCURONIUM BROMIDE 100 MG/10ML IV SOLN
INTRAVENOUS | Status: DC | PRN
  Administered 2024-02-21 (×2): 30 mg via INTRAVENOUS
  Administered 2024-02-21: 60 mg via INTRAVENOUS
  Administered 2024-02-21: 40 mg via INTRAVENOUS

## 2024-02-21 MED ORDER — "VISTASEAL 4 ML SINGLE DOSE KIT "
PACK | CUTANEOUS | Status: DC | PRN
  Administered 2024-02-21: 4 mL via TOPICAL

## 2024-02-21 MED ORDER — MIDAZOLAM HCL (PF) 2 MG/2ML IJ SOLN
INTRAMUSCULAR | Status: DC | PRN
  Administered 2024-02-21: 2 mg via INTRAVENOUS

## 2024-02-21 MED ORDER — OXYBUTYNIN CHLORIDE 5 MG PO TABS: 5.0000 mg | ORAL_TABLET | Freq: Three times a day (TID) | ORAL | Status: DC | PRN

## 2024-02-21 MED ORDER — OXYCODONE HCL 5 MG PO TABS
5.0000 mg | ORAL_TABLET | ORAL | Status: DC | PRN
  Administered 2024-02-21 (×2): 5 mg via ORAL
  Filled 2024-02-21: qty 1

## 2024-02-21 MED ORDER — BUPIVACAINE HCL (PF) 0.5 % IJ SOLN
INTRAMUSCULAR | Status: AC
  Filled 2024-02-21: qty 30

## 2024-02-21 NOTE — Anesthesia Procedure Notes (Signed)
 Procedure Name: Intubation Date/Time: 02/21/2024 7:36 AM  Performed by: Niki Manus SAUNDERS, CRNAPre-anesthesia Checklist: Patient identified, Patient being monitored, Timeout performed, Emergency Drugs available and Suction available Patient Re-evaluated:Patient Re-evaluated prior to induction Oxygen Delivery Method: Circle system utilized Preoxygenation: Pre-oxygenation with 100% oxygen Induction Type: IV induction Ventilation: Mask ventilation without difficulty Laryngoscope Size: McGrath and 4 Grade View: Grade I Tube type: Oral Tube size: 7.5 mm Number of attempts: 1 Airway Equipment and Method: Stylet Placement Confirmation: ETT inserted through vocal cords under direct vision, positive ETCO2 and breath sounds checked- equal and bilateral Secured at: 21 cm Tube secured with: Tape Dental Injury: Teeth and Oropharynx as per pre-operative assessment

## 2024-02-21 NOTE — Interval H&P Note (Signed)
 History and Physical Interval Note:  02/21/2024 7:01 AM  Randall Flores.  has presented today for surgery, with the diagnosis of Prostate Cancer.  The various methods of treatment have been discussed with the patient and family. After consideration of risks, benefits and other options for treatment, the patient has consented to  Procedures: PROSTATECTOMY, RADICAL, ROBOT-ASSISTED, LAPAROSCOPIC (N/A) LYMPHADENECTOMY, PELVIS, ROBOT-ASSISTED (Bilateral) as a surgical intervention.  The patient's history has been reviewed, patient examined, no change in status, stable for surgery.  I have reviewed the patient's chart and labs.  Questions were answered to the patient's satisfaction.    General: no acute distress, alert/oriented, conversational  HEENT: equal nondilated pupils CV: regular rate Lung: unlabored breathing, regular rate and rhythm  Abd: nondistended, nontender with palpation, no palpable masses  MSK: moving all extremities without issue, normal observed motor function   Randall Flores

## 2024-02-21 NOTE — Anesthesia Postprocedure Evaluation (Signed)
"   Anesthesia Post Note  Patient: Randall Flores.  Procedure(s) Performed: PROSTATECTOMY, RADICAL, ROBOT-ASSISTED, LAPAROSCOPIC (Prostate) LYMPHADENECTOMY, PELVIS, ROBOT-ASSISTED (Bilateral: Pelvis)  Patient location during evaluation: PACU Anesthesia Type: General Level of consciousness: awake and alert Pain management: pain level controlled Vital Signs Assessment: post-procedure vital signs reviewed and stable Respiratory status: spontaneous breathing, nonlabored ventilation and respiratory function stable Cardiovascular status: blood pressure returned to baseline and stable Postop Assessment: no apparent nausea or vomiting Anesthetic complications: no   No notable events documented.   Last Vitals:  Vitals:   02/21/24 1300 02/21/24 1314  BP: (!) 142/73 139/81  Pulse: 72 71  Resp: 14 16  Temp: (!) 36.2 C 36.4 C  SpO2: 96% 97%    Last Pain:  Vitals:   02/21/24 1314  TempSrc: Oral  PainSc: 7                  Anslee Micheletti [employer]      "

## 2024-02-21 NOTE — Transfer of Care (Signed)
 Immediate Anesthesia Transfer of Care Note  Patient: Randall Flores.  Procedure(s) Performed: PROSTATECTOMY, RADICAL, ROBOT-ASSISTED, LAPAROSCOPIC (Prostate) LYMPHADENECTOMY, PELVIS, ROBOT-ASSISTED (Bilateral: Pelvis)  Patient Location: PACU  Anesthesia Type:General  Level of Consciousness: drowsy  Airway & Oxygen Therapy: Patient Spontanous Breathing and Patient connected to face mask oxygen  Post-op Assessment: Report given to RN and Post -op Vital signs reviewed and stable  Post vital signs: Reviewed and stable  Last Vitals:  Vitals Value Taken Time  BP 162/80 02/21/24 11:36  Temp 36.7 C 02/21/24 11:36  Pulse 75 02/21/24 11:37  Resp 20 02/21/24 11:37  SpO2 100 % 02/21/24 11:37  Vitals shown include unfiled device data.  Last Pain:  Vitals:   02/21/24 9367  TempSrc: Temporal  PainSc: 4          Complications: No notable events documented.

## 2024-02-21 NOTE — Op Note (Signed)
 Date of procedure: 02/21/24  Preoperative diagnosis:  Prostate cancer   Postoperative diagnosis:  Prostate Cancer   Procedure: Robotic radical prostatectomy with bilateral pelvic lymph node dissection  Surgeon: Penne Skye, MD  Anesthesia: General  Complications: None  Intraoperative findings: ***  EBL: ***  Specimens: *** Periprostatic lymph nodes Right pelvic lymph nodes Left pelvic lymph nodes Prostate, seminal vesicals, and proximal vas deferens  Drains:  56F 2-way indwelling Foley catheter with 20cc in balloon***  Indication: Lazar Tierce. is a 69 y.o. patient with:  Unfavorable intermediate risk CaP, PSA 13.7   Initial diagnosis low-risk prostate Ca (dx 2020)    - GS6 in 3/12 cores MRI biopsy (targets only) (Mar 2025) - upstaged GS 3+4 in 1/3, GS6 in 1/3, 60g gland   IPSS: 23/4 SHIM: 18   After reviewing the management options for treatment, they elected to proceed with the above surgical procedure(s). We have discussed the potential benefits and risks of the procedure, side effects of the proposed treatment, the likelihood of the patient achieving the goals of the procedure, and any potential problems that might occur during the procedure or recuperation. Informed consent has been obtained.  Description of procedure:  Following induction, the patient was positioned in the supine position, taking care to pad all pressure points. Pre-operative 5000U subQ heparin  was administered along with 2g Ancef . A 3 pt identifying pre-incision surgical timeout was performed. A sterile Foley catheter was placed on the field. Next, a 1 cm supraumbilical incision was made, followed by Veress needle entry into the abdomen. Pneumoperitoneum of 15 mm Hg was achieved without issue. Next, we introduced an 8 mm robotic port. The robotic camera was inserted the abdomen was found to be free of injury. There were some mild left lower colonic side wall adhesions. The remainder of the  robotic ports were placed under direct visualization along a straight line at the umbilical level. A 12 mm Left lateral assistant port and a 5 mm LUQ port were placed. The patient was placed in steep Trendelenberg and the robot was docked. We began by taking down the LLQ sigmoid attachments sharply. Next, we started our dissection by incising the posterior peritoneum. Bilateral vas deferens were identified and divided prior to isolating and dissecting each seminal vesicle. Next, we developed a plane along Denovilliers fascia towards the prostatic apex. We then moved anteriorly where the bladder was dropped in standard fashion by dividing bilateral medial ligaments. We continued our dissection through each paravesical fat plane until the endopelvics were encountered and opened sharply. The puboprostatics were released exposing the underlying dorsal venous complex. Next, we incised our bladder neck. There was no median prostate lobe. The posterior plane was opened and we delivered our bilateral seminal vesicles and proximal vas deferens. A bilateral nerve spare was peformed by sharply dissecting the lateral prostatic fascia and releasing this inferolaterally. Bilateral prostate pedicles were taken with Hemolok clips. We completed our posterior plane, taking care to preserve our neurovascular bundles inferolaterally. Next, we came across the DVC with a pneumoperitoneum of 20mm Hg. The apex of the prostate was fully delineated and the urethral contours were exposed. The urethra was divided sharply. We oversewed the DVC with a running 3-0 Stratafix***. Hemostasis was excellent and the pneumoperitoneum was returned to 15. At this point the prostate specimen was bagged and the pelvis irrigated. Next, we performed a bilateral pelvic lymph node dissection. Care was taken bilaterally to identify and preserve the obturator nerves which were clearly visualized. Lymph  node packets were sent as separate final specimen. Next, we  began our anastomosis. This was completed using a double armed ***. Prior to final closure we easily passed our final 28F 2-way catheter under direct visualization. The anastomosis was tied and the bladder irrigated with 150cc of sterile water  without leak. Surgiflo was placed along each obturator fossa. Hemostasis was excellent. The robot instruments were removed and the robot undocked. The prostate specimen was extracted through the midline incision by gently incising the fascia. The fascia was closed with two opposing 0-Vicryl stitches tied in the middle. The 12 mm assistant port was closed with a 0-vicryl on UR6 in a figure of 8 type fashion. Port incisions were closed with 4-0 monocryl subcuticular stitches, followed by Dermabond. Catheter was draining clear yellow urine. The patient was awoken from anesthesia sent to the PACU in excellent condition.  Disposition: Stable to PACU  Plan:   Penne Skye, MD

## 2024-02-22 ENCOUNTER — Encounter: Payer: Self-pay | Admitting: Urology

## 2024-02-22 ENCOUNTER — Other Ambulatory Visit: Payer: Self-pay

## 2024-02-22 DIAGNOSIS — C61 Malignant neoplasm of prostate: Secondary | ICD-10-CM | POA: Diagnosis not present

## 2024-02-22 LAB — BASIC METABOLIC PANEL WITH GFR
Anion gap: 9 (ref 5–15)
BUN: 10 mg/dL (ref 8–23)
CO2: 24 mmol/L (ref 22–32)
Calcium: 8.7 mg/dL — ABNORMAL LOW (ref 8.9–10.3)
Chloride: 101 mmol/L (ref 98–111)
Creatinine, Ser: 0.96 mg/dL (ref 0.61–1.24)
GFR, Estimated: >60 mL/min
Glucose, Bld: 147 mg/dL — ABNORMAL HIGH (ref 70–99)
Potassium: 4.5 mmol/L (ref 3.5–5.1)
Sodium: 133 mmol/L — ABNORMAL LOW (ref 135–145)

## 2024-02-22 LAB — CBC
HCT: 37.9 % — ABNORMAL LOW (ref 39.0–52.0)
Hemoglobin: 13.1 g/dL (ref 13.0–17.0)
MCH: 30.5 pg (ref 26.0–34.0)
MCHC: 34.6 g/dL (ref 30.0–36.0)
MCV: 88.3 fL (ref 80.0–100.0)
Platelets: 206 10*3/uL (ref 150–400)
RBC: 4.29 MIL/uL (ref 4.22–5.81)
RDW: 13 % (ref 11.5–15.5)
WBC: 9.2 10*3/uL (ref 4.0–10.5)
nRBC: 0 % (ref 0.0–0.2)

## 2024-02-22 MED ORDER — OXYCODONE HCL 5 MG PO TABS
5.0000 mg | ORAL_TABLET | Freq: Four times a day (QID) | ORAL | 0 refills | Status: AC | PRN
  Filled 2024-02-22: qty 4, 1d supply, fill #0

## 2024-02-22 MED ORDER — OXYBUTYNIN CHLORIDE 5 MG PO TABS
5.0000 mg | ORAL_TABLET | Freq: Three times a day (TID) | ORAL | 0 refills | Status: AC | PRN
  Filled 2024-02-22: qty 30, 10d supply, fill #0

## 2024-02-22 NOTE — Plan of Care (Signed)
   Problem: Pain Management: Goal: General experience of comfort will improve Outcome: Progressing   Problem: Skin Integrity: Goal: Demonstration of wound healing without infection will improve Outcome: Progressing

## 2024-02-22 NOTE — Progress Notes (Signed)
 DISCHARGE NOTE:  Pt given discharge instructions and verbalized understanding. Pt discharged with foley, extra leg bag, and educated on foley and foley care. All questions answered. Beds to meds medications sent with pt. Pt wheeled to car by staff, friend providing transportation home.

## 2024-02-22 NOTE — Progress Notes (Signed)
 Pt ambulated in hallway with nurse

## 2024-02-22 NOTE — Discharge Summary (Signed)
 Date of admission: 02/21/2024  Date of discharge: 02/22/2024  Admission diagnosis: Prostate cancer  Discharge diagnosis: Same as above  Secondary diagnoses:  Patient Active Problem List   Diagnosis Date Noted   Prostate cancer (HCC) 10/25/2023   Elevated troponin 07/20/2021   Paroxysmal atrial fibrillation (HCC) 07/20/2021   Colitis, ischemic    Generalized weakness 07/19/2021   Lower GI bleed 07/19/2021   Severe sepsis (HCC) 07/19/2021   AKI (acute kidney injury) 07/19/2021   Caregiver stress syndrome 04/25/2021   Essential hypertension 04/25/2021   Hypothyroidism 04/25/2021   Depression 04/25/2021   BPH (benign prostatic hyperplasia) 04/25/2021   Hyperlipidemia 04/25/2021   Normocytic anemia 04/25/2021   Syncope 04/24/2021   Orthostatic hypotension    S/P AVR 04/16/2021   Severe aortic stenosis 03/10/2021   Primary localized osteoarthritis of right knee 04/07/2018   Colon cancer screening    Pseudopolyposis of colon without complication (HCC)    Abdominal pain, right upper quadrant 02/06/2015   Right lower quadrant abdominal pain 02/06/2015    History and Physical: For full details, please see admission history and physical. Briefly, Randall Flores. is a 69 y.o. year old patient admitted on 02/21/2024 for scheduled robotic assisted radical prostatectomy with bilateral pelvic lymph node dissection, bilateral nerve-sparing, with Dr. Georganne for management of prostate cancer.   Anticipated postop changes on a.m. labs.  Foley catheter in place draining clear, pink-tinged urine.   Diet advanced in the morning.  As of midday, he is tolerating PO, has ambulated on the unit, and pain remains appropriate given postop status.  Physical Exam: Constitutional:  Alert and oriented, no acute distress, nontoxic appearing HEENT: [place], AT Cardiovascular: No clubbing, cyanosis, or edema Respiratory: Normal respiratory effort, no increased work of breathing GI: Abdomen is soft with  appropriate postoperative tenderness, no rigidity or rebound. Incisions clean/dry/intact with overlying surgical adhesive. Skin: No rashes, bruises or suspicious lesions Neurologic: Grossly intact, no focal deficits, moving all 4 extremities Psychiatric: Normal mood and affect   Hospital Course: Patient tolerated the procedure well.  He was then transferred to the floor after an uneventful PACU stay.  His hospital course was uncomplicated.  On POD#1 he had met discharge criteria: was eating a regular diet, was up and ambulating independently,  pain was well controlled, catheter was draining well, and was ready for discharge.  Laboratory values:  Recent Labs    02/22/24 0744  WBC 9.2  HGB 13.1  HCT 37.9*   Recent Labs    02/22/24 0744  NA 133*  K 4.5  CL 101  CO2 24  GLUCOSE 147*  BUN 10  CREATININE 0.96  CALCIUM  8.7*   Results for orders placed or performed during the hospital encounter of 02/15/24  Urine Culture     Status: None   Collection Time: 02/15/24  9:04 AM   Specimen: Urine, Clean Catch  Result Value Ref Range Status   Specimen Description   Final    URINE, CLEAN CATCH Performed at [facility], [address], [place], [place] [address]    Special Requests   Final    NONE Performed at [facility], [address] [place], [place] [address]    Culture   Final    NO GROWTH Performed at [facility], [address] [place], [place] [address]    Report Status 02/16/2024 FINAL  Final   Disposition: Home  Discharge instruction: The patient was instructed to be ambulatory but told to refrain  from heavy lifting, strenuous activity, or driving. Catheter care instructions provided by nursing.  Discharge medications:  Allergies as of 02/22/2024       Reactions   Codeine Nausea And Vomiting   Pravastatin Other (See Comments)   Muscle Pain   Shellfish Allergy Nausea And Vomiting        Medication List     STOP taking these  medications    oxybutynin  10 MG 24 hr tablet Commonly known as: DITROPAN -XL Replaced by: oxybutynin  5 MG tablet   tamsulosin  0.4 MG Caps capsule Commonly known as: FLOMAX        TAKE these medications    docusate sodium  100 MG capsule Commonly known as: COLACE Take 100 mg by mouth daily.   DULoxetine  HCl 40 MG Cpep Take 80 mg by mouth at bedtime.   levothyroxine  112 MCG tablet Commonly known as: SYNTHROID  Take 112 mcg by mouth daily before breakfast.   losartan  50 MG tablet Commonly known as: COZAAR  Take 1 tablet (50 mg total) by mouth daily.   oxybutynin  5 MG tablet Commonly known as: DITROPAN  Take 1 tablet (5 mg total) by mouth every 8 (eight) hours as needed for bladder spasms. Replaces: oxybutynin  10 MG 24 hr tablet   oxyCODONE  5 MG immediate release tablet Commonly known as: Oxy IR/ROXICODONE  Take 1 tablet (5 mg total) by mouth every 6 (six) hours as needed for severe pain (pain score 7-10).   rosuvastatin  20 MG tablet Commonly known as: CRESTOR  Take 1 tablet (20 mg total) by mouth daily.   traZODone  50 MG tablet Commonly known as: DESYREL  Take 1 tablet (50 mg total) by mouth at bedtime as needed for sleep. What changed:  how much to take when to take this        Followup:   Follow-up Information     Maurine Lukes, PA-C Follow up on 02/28/2024.   Specialty: Urology Why: For catheter removal Contact information: [address] [place] [address] [phone]

## 2024-02-22 NOTE — Progress Notes (Signed)
Pt tolerated lunch.

## 2024-02-27 NOTE — Addendum Note (Signed)
 Addendum  created 02/27/24 1703 by Vicci Camellia Glatter, MD   Attestation recorded in Intraprocedure, Intraprocedure Attestations deleted, Intraprocedure Attestations filed

## 2024-02-28 ENCOUNTER — Encounter: Admitting: Physician Assistant

## 2024-02-28 ENCOUNTER — Ambulatory Visit (INDEPENDENT_AMBULATORY_CARE_PROVIDER_SITE_OTHER): Admitting: Physician Assistant

## 2024-02-28 ENCOUNTER — Encounter: Payer: Self-pay | Admitting: Physician Assistant

## 2024-02-28 VITALS — BP 127/82 | HR 80 | Ht 70.5 in | Wt 201.6 lb

## 2024-02-28 DIAGNOSIS — C61 Malignant neoplasm of prostate: Secondary | ICD-10-CM

## 2024-02-28 LAB — SURGICAL PATHOLOGY

## 2024-02-28 MED ORDER — SULFAMETHOXAZOLE-TRIMETHOPRIM 800-160 MG PO TABS: 1.0000 | ORAL_TABLET | Freq: Once | ORAL | Status: AC

## 2024-02-28 MED ORDER — TADALAFIL 5 MG PO TABS: 5.0000 mg | ORAL_TABLET | Freq: Every day | ORAL | 11 refills | Status: AC

## 2024-02-28 MED ORDER — SULFAMETHOXAZOLE-TRIMETHOPRIM 800-160 MG PO TABS
1.0000 | ORAL_TABLET | Freq: Once | ORAL | Status: AC
  Administered 2024-02-28: 1 via ORAL

## 2024-02-28 MED ORDER — TADALAFIL 5 MG PO TABS: 5.0000 mg | ORAL_TABLET | Freq: Every day | ORAL | 11 refills | Status: DC

## 2024-02-28 NOTE — Addendum Note (Signed)
 Addended by: MAURINE SHEPPARD SQUIBB on: 02/28/2024 04:11 PM   Modules accepted: Orders

## 2024-02-28 NOTE — Progress Notes (Signed)
 Catheter Removal  Patient is present today for a catheter removal.  19ml of water  was drained from the balloon. A 20FR foley cath was removed from the bladder, no complications were noted. Patient tolerated well.  Performed by: Lucie Hones, PA-C   Additional notes: Patient denies LE edema, redness, or pain; chest pain; shortness of breath; palpitations. He is having BMs and continues to tolerate PO. Abdomen is soft without rigidity or rebound. Incisions are clean, dry, and intact.  Bactrim  DS x1 dose administered prior to Foley removal for UTI prevention.  Surgical pathology results shared with patient. We discussed negative margins and nodes.  Starting tadalafil  5mg  daily x1 year for penile rehabilitation.  We discussed anticipated postop urinary leakage. I specifically counseled him to anticipate leakage, wear absorbant products as needed for security, and that his first year postop will show the greatest improvement in leakage.   Follow up: Return in about 6 weeks (around 04/10/2024) for Postop f/u with Dr. Georganne with PSA prior.

## 2024-02-29 ENCOUNTER — Telehealth: Payer: Self-pay

## 2024-02-29 NOTE — Telephone Encounter (Signed)
 PA for tadalafil  has been initiated through cover my meds.

## 2024-04-04 ENCOUNTER — Other Ambulatory Visit

## 2024-04-11 ENCOUNTER — Ambulatory Visit: Admitting: Urology
